# Patient Record
Sex: Male | Born: 1955 | Race: White | Hispanic: No | Marital: Single | Smoking: Former smoker
Health system: Southern US, Community
[De-identification: ages and names within clinical notes are randomized; demographics above are authoritative.]

## PROBLEM LIST (undated history)

## (undated) DIAGNOSIS — Z95810 Presence of automatic (implantable) cardiac defibrillator: Secondary | ICD-10-CM

## (undated) DIAGNOSIS — H9319 Tinnitus, unspecified ear: Secondary | ICD-10-CM

## (undated) DIAGNOSIS — K219 Gastro-esophageal reflux disease without esophagitis: Secondary | ICD-10-CM

## (undated) DIAGNOSIS — Z950 Presence of cardiac pacemaker: Secondary | ICD-10-CM

## (undated) DIAGNOSIS — I509 Heart failure, unspecified: Secondary | ICD-10-CM

## (undated) DIAGNOSIS — I4819 Other persistent atrial fibrillation: Secondary | ICD-10-CM

## (undated) DIAGNOSIS — D4959 Neoplasm of unspecified behavior of other genitourinary organ: Secondary | ICD-10-CM

## (undated) DIAGNOSIS — R51 Headache: Secondary | ICD-10-CM

## (undated) DIAGNOSIS — K635 Polyp of colon: Secondary | ICD-10-CM

## (undated) DIAGNOSIS — G4700 Insomnia, unspecified: Secondary | ICD-10-CM

## (undated) DIAGNOSIS — K5790 Diverticulosis of intestine, part unspecified, without perforation or abscess without bleeding: Secondary | ICD-10-CM

## (undated) DIAGNOSIS — I447 Left bundle-branch block, unspecified: Secondary | ICD-10-CM

## (undated) DIAGNOSIS — E785 Hyperlipidemia, unspecified: Secondary | ICD-10-CM

## (undated) DIAGNOSIS — I2510 Atherosclerotic heart disease of native coronary artery without angina pectoris: Secondary | ICD-10-CM

## (undated) HISTORY — PX: TONSILLECTOMY: SUR1361

## (undated) HISTORY — DX: Tinnitus, unspecified ear: H93.19

## (undated) HISTORY — DX: Polyp of colon: K63.5

## (undated) HISTORY — DX: Left bundle-branch block, unspecified: I44.7

## (undated) HISTORY — DX: Atherosclerotic heart disease of native coronary artery without angina pectoris: I25.10

## (undated) HISTORY — DX: Heart failure, unspecified: I50.9

## (undated) HISTORY — PX: OTHER SURGICAL HISTORY: SHX169

## (undated) HISTORY — DX: Presence of automatic (implantable) cardiac defibrillator: Z95.810

## (undated) HISTORY — DX: Other persistent atrial fibrillation: I48.19

## (undated) HISTORY — DX: Hyperlipidemia, unspecified: E78.5

## (undated) HISTORY — DX: Insomnia, unspecified: G47.00

## (undated) HISTORY — DX: Diverticulosis of intestine, part unspecified, without perforation or abscess without bleeding: K57.90

## (undated) HISTORY — DX: Neoplasm of unspecified behavior of other genitourinary organ: D49.59

---

## 2003-07-11 HISTORY — PX: CARDIAC CATHETERIZATION: SHX172

## 2003-07-23 ENCOUNTER — Inpatient Hospital Stay (HOSPITAL_COMMUNITY): Admission: EM | Admit: 2003-07-23 | Discharge: 2003-08-12 | Payer: Self-pay | Admitting: Emergency Medicine

## 2003-08-10 ENCOUNTER — Encounter: Payer: Self-pay | Admitting: Internal Medicine

## 2003-08-10 HISTORY — PX: OTHER SURGICAL HISTORY: SHX169

## 2003-08-31 ENCOUNTER — Encounter (HOSPITAL_COMMUNITY): Admission: RE | Admit: 2003-08-31 | Discharge: 2003-11-29 | Payer: Self-pay | Admitting: *Deleted

## 2004-05-11 ENCOUNTER — Ambulatory Visit: Payer: Self-pay | Admitting: Cardiology

## 2004-05-11 ENCOUNTER — Ambulatory Visit: Payer: Self-pay | Admitting: Internal Medicine

## 2004-05-26 ENCOUNTER — Ambulatory Visit: Payer: Self-pay | Admitting: Internal Medicine

## 2004-06-08 ENCOUNTER — Ambulatory Visit: Payer: Self-pay

## 2004-06-08 ENCOUNTER — Ambulatory Visit: Payer: Self-pay | Admitting: Cardiology

## 2004-06-13 ENCOUNTER — Ambulatory Visit: Admission: RE | Admit: 2004-06-13 | Discharge: 2004-06-13 | Payer: Self-pay | Admitting: Internal Medicine

## 2004-06-13 ENCOUNTER — Ambulatory Visit: Payer: Self-pay | Admitting: Internal Medicine

## 2004-06-14 ENCOUNTER — Ambulatory Visit: Payer: Self-pay | Admitting: Internal Medicine

## 2004-06-15 ENCOUNTER — Inpatient Hospital Stay (HOSPITAL_COMMUNITY): Admission: AD | Admit: 2004-06-15 | Discharge: 2004-06-16 | Payer: Self-pay | Admitting: Internal Medicine

## 2004-06-15 HISTORY — PX: OTHER SURGICAL HISTORY: SHX169

## 2004-06-28 ENCOUNTER — Ambulatory Visit: Payer: Self-pay | Admitting: Internal Medicine

## 2004-07-15 ENCOUNTER — Ambulatory Visit: Payer: Self-pay | Admitting: *Deleted

## 2004-07-29 ENCOUNTER — Ambulatory Visit: Payer: Self-pay | Admitting: Internal Medicine

## 2004-09-13 ENCOUNTER — Ambulatory Visit: Payer: Self-pay | Admitting: *Deleted

## 2004-10-12 ENCOUNTER — Ambulatory Visit: Payer: Self-pay | Admitting: Internal Medicine

## 2004-10-24 ENCOUNTER — Ambulatory Visit: Payer: Self-pay

## 2004-10-24 ENCOUNTER — Ambulatory Visit: Payer: Self-pay | Admitting: *Deleted

## 2004-10-26 ENCOUNTER — Ambulatory Visit: Payer: Self-pay | Admitting: Internal Medicine

## 2004-11-02 ENCOUNTER — Ambulatory Visit: Payer: Self-pay | Admitting: Internal Medicine

## 2004-11-10 ENCOUNTER — Ambulatory Visit: Payer: Self-pay | Admitting: Internal Medicine

## 2004-12-06 ENCOUNTER — Ambulatory Visit: Payer: Self-pay | Admitting: Cardiology

## 2004-12-14 ENCOUNTER — Ambulatory Visit: Payer: Self-pay | Admitting: Internal Medicine

## 2005-01-03 ENCOUNTER — Ambulatory Visit: Payer: Self-pay | Admitting: Internal Medicine

## 2005-02-02 ENCOUNTER — Ambulatory Visit: Payer: Self-pay | Admitting: Internal Medicine

## 2005-02-02 ENCOUNTER — Ambulatory Visit: Payer: Self-pay

## 2005-05-29 ENCOUNTER — Ambulatory Visit: Payer: Self-pay | Admitting: Internal Medicine

## 2005-10-17 ENCOUNTER — Ambulatory Visit: Payer: Self-pay | Admitting: Internal Medicine

## 2006-01-18 ENCOUNTER — Ambulatory Visit: Payer: Self-pay | Admitting: Internal Medicine

## 2006-02-05 ENCOUNTER — Ambulatory Visit: Payer: Self-pay | Admitting: Internal Medicine

## 2006-02-09 ENCOUNTER — Ambulatory Visit: Payer: Self-pay | Admitting: Internal Medicine

## 2006-02-14 ENCOUNTER — Encounter: Payer: Self-pay | Admitting: Internal Medicine

## 2006-02-14 ENCOUNTER — Ambulatory Visit: Payer: Self-pay | Admitting: Internal Medicine

## 2006-02-14 ENCOUNTER — Ambulatory Visit (HOSPITAL_COMMUNITY): Admission: RE | Admit: 2006-02-14 | Discharge: 2006-02-14 | Payer: Self-pay | Admitting: Internal Medicine

## 2006-02-14 ENCOUNTER — Ambulatory Visit: Payer: Self-pay

## 2006-03-01 ENCOUNTER — Ambulatory Visit: Payer: Self-pay | Admitting: Internal Medicine

## 2006-03-23 ENCOUNTER — Ambulatory Visit: Payer: Self-pay

## 2006-04-12 ENCOUNTER — Ambulatory Visit: Payer: Self-pay | Admitting: Internal Medicine

## 2006-07-19 ENCOUNTER — Ambulatory Visit: Payer: Self-pay | Admitting: Internal Medicine

## 2006-07-30 ENCOUNTER — Ambulatory Visit: Payer: Self-pay

## 2006-08-01 ENCOUNTER — Ambulatory Visit: Payer: Self-pay | Admitting: Internal Medicine

## 2006-08-01 LAB — CONVERTED CEMR LAB
ALT: 29 units/L (ref 0–40)
AST: 20 units/L (ref 0–37)
Albumin: 4.1 g/dL (ref 3.5–5.2)
Alkaline Phosphatase: 46 units/L (ref 39–117)
BUN: 19 mg/dL (ref 6–23)
CO2: 28 meq/L (ref 19–32)
Calcium: 9.3 mg/dL (ref 8.4–10.5)
Chloride: 103 meq/L (ref 96–112)
Cholesterol: 84 mg/dL (ref 0–200)
Creatinine, Ser: 1.1 mg/dL (ref 0.4–1.5)
GFR calc Af Amer: 91 mL/min
GFR calc non Af Amer: 75 mL/min
Glucose, Bld: 104 mg/dL — ABNORMAL HIGH (ref 70–99)
HDL: 32.6 mg/dL — ABNORMAL LOW (ref >39.0)
Hgb A1c MFr Bld: 6.7 % — ABNORMAL HIGH (ref 4.6–6.0)
LDL Cholesterol: 32 mg/dL (ref 0–99)
Potassium: 4.5 meq/L (ref 3.5–5.1)
Pro B Natriuretic peptide (BNP): 137 pg/mL — ABNORMAL HIGH (ref 0.0–100.0)
Sodium: 137 meq/L (ref 135–145)
Total Bilirubin: 1.1 mg/dL (ref 0.3–1.2)
Total CHOL/HDL Ratio: 2.6
Total Protein: 7.1 g/dL (ref 6.0–8.3)
Triglycerides: 97 mg/dL (ref 0–149)
VLDL: 19 mg/dL (ref 0–40)

## 2006-10-22 ENCOUNTER — Ambulatory Visit: Payer: Self-pay | Admitting: Internal Medicine

## 2006-10-22 LAB — CONVERTED CEMR LAB
ALT: 39 units/L (ref 0–40)
Alkaline Phosphatase: 46 units/L (ref 39–117)
BUN: 14 mg/dL (ref 6–23)
CO2: 27 meq/L (ref 19–32)
Calcium: 9.5 mg/dL (ref 8.4–10.5)
Chloride: 109 meq/L (ref 96–112)
Creatinine, Ser: 0.9 mg/dL (ref 0.4–1.5)
GFR calc Af Amer: 114 mL/min
GFR calc non Af Amer: 95 mL/min
Glucose, Bld: 110 mg/dL — ABNORMAL HIGH (ref 70–99)
Hgb A1c MFr Bld: 6.7 % — ABNORMAL HIGH (ref 4.6–6.0)
LDL Cholesterol: 36 mg/dL (ref 0–99)
PSA: 0.31 ng/mL (ref 0.10–4.00)
Potassium: 4.4 meq/L (ref 3.5–5.1)
Pro B Natriuretic peptide (BNP): 133 pg/mL — ABNORMAL HIGH (ref 0.0–100.0)
Total CHOL/HDL Ratio: 2.5
Total Protein: 7.1 g/dL (ref 6.0–8.3)
Triglycerides: 85 mg/dL (ref 0–149)
VLDL: 17 mg/dL (ref 0–40)

## 2006-10-25 ENCOUNTER — Ambulatory Visit: Payer: Self-pay | Admitting: Internal Medicine

## 2006-11-15 ENCOUNTER — Ambulatory Visit: Payer: Self-pay | Admitting: Internal Medicine

## 2006-12-12 ENCOUNTER — Encounter: Payer: Self-pay | Admitting: Internal Medicine

## 2006-12-12 ENCOUNTER — Ambulatory Visit: Payer: Self-pay | Admitting: Internal Medicine

## 2007-02-04 ENCOUNTER — Ambulatory Visit: Payer: Self-pay | Admitting: Internal Medicine

## 2007-03-21 ENCOUNTER — Ambulatory Visit: Payer: Self-pay | Admitting: Internal Medicine

## 2007-05-27 ENCOUNTER — Encounter: Payer: Self-pay | Admitting: Internal Medicine

## 2007-05-27 ENCOUNTER — Ambulatory Visit: Payer: Self-pay | Admitting: Cardiology

## 2007-05-27 ENCOUNTER — Ambulatory Visit: Payer: Self-pay | Admitting: Internal Medicine

## 2007-06-20 ENCOUNTER — Ambulatory Visit: Payer: Self-pay | Admitting: Internal Medicine

## 2007-08-07 ENCOUNTER — Ambulatory Visit: Payer: Self-pay | Admitting: Internal Medicine

## 2007-08-14 ENCOUNTER — Ambulatory Visit: Payer: Self-pay | Admitting: Internal Medicine

## 2007-08-23 ENCOUNTER — Encounter: Payer: Self-pay | Admitting: Internal Medicine

## 2007-08-23 ENCOUNTER — Ambulatory Visit: Payer: Self-pay

## 2007-11-07 ENCOUNTER — Ambulatory Visit: Payer: Self-pay | Admitting: Internal Medicine

## 2007-11-21 ENCOUNTER — Ambulatory Visit: Payer: Self-pay | Admitting: Internal Medicine

## 2008-05-07 ENCOUNTER — Ambulatory Visit: Payer: Self-pay | Admitting: Internal Medicine

## 2008-05-18 ENCOUNTER — Ambulatory Visit (HOSPITAL_COMMUNITY): Admission: RE | Admit: 2008-05-18 | Discharge: 2008-05-18 | Payer: Self-pay | Admitting: Internal Medicine

## 2008-05-18 ENCOUNTER — Ambulatory Visit: Payer: Self-pay | Admitting: Internal Medicine

## 2008-07-16 ENCOUNTER — Ambulatory Visit: Payer: Self-pay | Admitting: Internal Medicine

## 2008-07-16 LAB — CONVERTED CEMR LAB
ALT: 25 units/L (ref 0–53)
AST: 19 units/L (ref 0–37)
Albumin: 3.8 g/dL (ref 3.5–5.2)
Alkaline Phosphatase: 43 units/L (ref 39–117)
BUN: 17 mg/dL (ref 6–23)
Bilirubin, Direct: 0.1 mg/dL (ref 0.0–0.3)
CO2: 25 meq/L (ref 19–32)
Calcium: 9 mg/dL (ref 8.4–10.5)
Chloride: 104 meq/L (ref 96–112)
Cholesterol: 84 mg/dL (ref 0–200)
Creatinine, Ser: 1 mg/dL (ref 0.4–1.5)
GFR calc Af Amer: 101 mL/min
GFR calc non Af Amer: 83 mL/min
Glucose, Bld: 137 mg/dL — ABNORMAL HIGH (ref 70–99)
HDL: 33.2 mg/dL — ABNORMAL LOW (ref >39.0)
LDL Cholesterol: 31 mg/dL (ref 0–99)
Potassium: 4.2 meq/L (ref 3.5–5.1)
Pro B Natriuretic peptide (BNP): 126 pg/mL — ABNORMAL HIGH (ref 0.0–100.0)
Sodium: 135 meq/L (ref 135–145)
Total Bilirubin: 1 mg/dL (ref 0.3–1.2)
Total CHOL/HDL Ratio: 2.5
Total Protein: 7 g/dL (ref 6.0–8.3)
Triglycerides: 99 mg/dL (ref 0–149)
VLDL: 20 mg/dL (ref 0–40)

## 2008-08-14 ENCOUNTER — Ambulatory Visit: Payer: Self-pay | Admitting: Internal Medicine

## 2008-08-14 ENCOUNTER — Encounter (INDEPENDENT_AMBULATORY_CARE_PROVIDER_SITE_OTHER): Payer: Self-pay | Admitting: *Deleted

## 2008-11-12 ENCOUNTER — Ambulatory Visit: Payer: Self-pay | Admitting: Internal Medicine

## 2008-12-02 ENCOUNTER — Ambulatory Visit: Payer: Self-pay | Admitting: Internal Medicine

## 2008-12-02 DIAGNOSIS — I255 Ischemic cardiomyopathy: Secondary | ICD-10-CM

## 2008-12-02 DIAGNOSIS — E785 Hyperlipidemia, unspecified: Secondary | ICD-10-CM

## 2008-12-02 DIAGNOSIS — I5022 Chronic systolic (congestive) heart failure: Secondary | ICD-10-CM | POA: Insufficient documentation

## 2008-12-09 ENCOUNTER — Encounter (INDEPENDENT_AMBULATORY_CARE_PROVIDER_SITE_OTHER): Payer: Self-pay | Admitting: *Deleted

## 2009-02-11 ENCOUNTER — Ambulatory Visit: Payer: Self-pay | Admitting: Internal Medicine

## 2009-05-13 ENCOUNTER — Ambulatory Visit: Payer: Self-pay | Admitting: Internal Medicine

## 2009-05-17 ENCOUNTER — Encounter: Payer: Self-pay | Admitting: Internal Medicine

## 2009-06-21 ENCOUNTER — Ambulatory Visit: Payer: Self-pay | Admitting: Internal Medicine

## 2009-08-30 ENCOUNTER — Ambulatory Visit: Payer: Self-pay | Admitting: Internal Medicine

## 2009-09-01 ENCOUNTER — Encounter: Payer: Self-pay | Admitting: Internal Medicine

## 2009-10-08 DIAGNOSIS — Z95810 Presence of automatic (implantable) cardiac defibrillator: Secondary | ICD-10-CM

## 2009-10-08 HISTORY — DX: Presence of automatic (implantable) cardiac defibrillator: Z95.810

## 2009-11-01 ENCOUNTER — Encounter: Payer: Self-pay | Admitting: Internal Medicine

## 2009-11-03 ENCOUNTER — Emergency Department (HOSPITAL_COMMUNITY): Admission: EM | Admit: 2009-11-03 | Discharge: 2009-11-03 | Payer: Self-pay | Admitting: Emergency Medicine

## 2009-11-03 ENCOUNTER — Ambulatory Visit: Payer: Self-pay | Admitting: Internal Medicine

## 2009-11-10 ENCOUNTER — Telehealth: Payer: Self-pay | Admitting: Internal Medicine

## 2009-11-24 ENCOUNTER — Ambulatory Visit: Payer: Self-pay | Admitting: Internal Medicine

## 2009-11-30 ENCOUNTER — Encounter: Payer: Self-pay | Admitting: Internal Medicine

## 2009-12-01 ENCOUNTER — Encounter (INDEPENDENT_AMBULATORY_CARE_PROVIDER_SITE_OTHER): Payer: Self-pay | Admitting: *Deleted

## 2009-12-02 ENCOUNTER — Ambulatory Visit: Payer: Self-pay | Admitting: Internal Medicine

## 2009-12-28 ENCOUNTER — Encounter: Payer: Self-pay | Admitting: Internal Medicine

## 2009-12-28 ENCOUNTER — Ambulatory Visit: Payer: Self-pay | Admitting: Internal Medicine

## 2009-12-28 LAB — CONVERTED CEMR LAB
BUN: 19 mg/dL (ref 6–23)
CO2: 26 meq/L (ref 19–32)
Calcium: 10 mg/dL (ref 8.4–10.5)
Chloride: 105 meq/L (ref 96–112)
Creatinine, Ser: 0.9 mg/dL (ref 0.4–1.5)
GFR calc non Af Amer: 88.73 mL/min (ref >60)
Glucose, Bld: 105 mg/dL — ABNORMAL HIGH (ref 70–99)
Potassium: 4.4 meq/L (ref 3.5–5.1)
Pro B Natriuretic peptide (BNP): 312.7 pg/mL — ABNORMAL HIGH (ref 0.0–100.0)
Sodium: 139 meq/L (ref 135–145)

## 2010-01-20 ENCOUNTER — Encounter: Payer: Self-pay | Admitting: Internal Medicine

## 2010-01-21 ENCOUNTER — Ambulatory Visit: Payer: Self-pay | Admitting: Internal Medicine

## 2010-01-24 ENCOUNTER — Telehealth (INDEPENDENT_AMBULATORY_CARE_PROVIDER_SITE_OTHER): Payer: Self-pay | Admitting: *Deleted

## 2010-01-25 ENCOUNTER — Ambulatory Visit: Payer: Self-pay | Admitting: Cardiology

## 2010-01-25 ENCOUNTER — Encounter (HOSPITAL_COMMUNITY): Admission: RE | Admit: 2010-01-25 | Discharge: 2010-03-29 | Payer: Self-pay | Admitting: Internal Medicine

## 2010-01-25 ENCOUNTER — Ambulatory Visit: Payer: Self-pay | Admitting: Internal Medicine

## 2010-01-25 ENCOUNTER — Ambulatory Visit: Payer: Self-pay

## 2010-01-25 ENCOUNTER — Encounter: Payer: Self-pay | Admitting: Cardiology

## 2010-01-25 DIAGNOSIS — R0602 Shortness of breath: Secondary | ICD-10-CM

## 2010-01-25 LAB — CONVERTED CEMR LAB
BUN: 17 mg/dL (ref 6–23)
Basophils Absolute: 0 10*3/uL (ref 0.0–0.1)
Basophils Relative: 0.6 % (ref 0.0–3.0)
CO2: 26 meq/L (ref 19–32)
Calcium: 9.4 mg/dL (ref 8.4–10.5)
Chloride: 108 meq/L (ref 96–112)
Creatinine, Ser: 0.9 mg/dL (ref 0.4–1.5)
Eosinophils Absolute: 0.1 10*3/uL (ref 0.0–0.7)
Eosinophils Relative: 1.8 % (ref 0.0–5.0)
GFR calc non Af Amer: 88.71 mL/min (ref >60)
Glucose, Bld: 118 mg/dL — ABNORMAL HIGH (ref 70–99)
HCT: 45.2 % (ref 39.0–52.0)
Hemoglobin: 15 g/dL (ref 13.0–17.0)
INR: 1 (ref 0.8–1.0)
Lymphocytes Relative: 22.6 % (ref 12.0–46.0)
Lymphs Abs: 1.4 10*3/uL (ref 0.7–4.0)
MCHC: 33.2 g/dL (ref 30.0–36.0)
MCV: 86 fL (ref 78.0–100.0)
Monocytes Absolute: 0.5 10*3/uL (ref 0.1–1.0)
Monocytes Relative: 7.7 % (ref 3.0–12.0)
Neutro Abs: 4.1 10*3/uL (ref 1.4–7.7)
Neutrophils Relative %: 67.3 % (ref 43.0–77.0)
Platelets: 184 10*3/uL (ref 150.0–400.0)
Potassium: 4.4 meq/L (ref 3.5–5.1)
Pro B Natriuretic peptide (BNP): 264.7 pg/mL — ABNORMAL HIGH (ref 0.0–100.0)
Prothrombin Time: 10.2 s (ref 9.7–11.8)
RBC: 5.26 M/uL (ref 4.22–5.81)
RDW: 14.9 % — ABNORMAL HIGH (ref 11.5–14.6)
Sodium: 138 meq/L (ref 135–145)
WBC: 6 10*3/uL (ref 4.5–10.5)
aPTT: 25.7 s (ref 21.7–28.8)

## 2010-01-27 ENCOUNTER — Ambulatory Visit (HOSPITAL_COMMUNITY): Admission: RE | Admit: 2010-01-27 | Discharge: 2010-01-27 | Payer: Self-pay | Admitting: Internal Medicine

## 2010-01-27 ENCOUNTER — Ambulatory Visit: Payer: Self-pay | Admitting: Internal Medicine

## 2010-01-28 ENCOUNTER — Encounter: Payer: Self-pay | Admitting: Internal Medicine

## 2010-02-09 ENCOUNTER — Ambulatory Visit: Payer: Self-pay

## 2010-02-09 ENCOUNTER — Encounter: Payer: Self-pay | Admitting: Internal Medicine

## 2010-04-28 ENCOUNTER — Encounter: Payer: Self-pay | Admitting: Internal Medicine

## 2010-05-02 ENCOUNTER — Ambulatory Visit: Payer: Self-pay | Admitting: Internal Medicine

## 2010-05-30 ENCOUNTER — Ambulatory Visit: Payer: Self-pay | Admitting: Internal Medicine

## 2010-06-23 ENCOUNTER — Encounter: Payer: Self-pay | Admitting: Internal Medicine

## 2010-06-23 ENCOUNTER — Ambulatory Visit (HOSPITAL_COMMUNITY)
Admission: RE | Admit: 2010-06-23 | Discharge: 2010-06-23 | Payer: Self-pay | Source: Home / Self Care | Attending: Internal Medicine | Admitting: Internal Medicine

## 2010-08-04 ENCOUNTER — Ambulatory Visit
Admission: RE | Admit: 2010-08-04 | Discharge: 2010-08-04 | Payer: Self-pay | Source: Home / Self Care | Attending: Internal Medicine | Admitting: Internal Medicine

## 2010-08-04 ENCOUNTER — Encounter: Payer: Self-pay | Admitting: Internal Medicine

## 2010-08-07 LAB — CONVERTED CEMR LAB
ALT: 34 units/L (ref 0–53)
AST: 24 units/L (ref 0–37)
Albumin: 4.4 g/dL (ref 3.5–5.2)
Alkaline Phosphatase: 46 units/L (ref 39–117)
BUN: 20 mg/dL (ref 6–23)
Basophils Absolute: 0 10*3/uL (ref 0.0–0.1)
Basophils Relative: 0.5 % (ref 0.0–3.0)
Bilirubin, Direct: 0.1 mg/dL (ref 0.0–0.3)
CO2: 27 meq/L (ref 19–32)
Calcium: 9.5 mg/dL (ref 8.4–10.5)
Chloride: 106 meq/L (ref 96–112)
Cholesterol, target level: 200 mg/dL
Cholesterol: 99 mg/dL (ref 0–200)
Creatinine, Ser: 1 mg/dL (ref 0.4–1.5)
Eosinophils Absolute: 0.1 10*3/uL (ref 0.0–0.7)
Eosinophils Relative: 1.4 % (ref 0.0–5.0)
GFR calc non Af Amer: 82.95 mL/min (ref >60)
Glucose, Bld: 93 mg/dL (ref 70–99)
HCT: 45 % (ref 39.0–52.0)
HDL goal, serum: 40 mg/dL
HDL: 39.6 mg/dL (ref >39.00)
Hemoglobin: 14.9 g/dL (ref 13.0–17.0)
LDL Cholesterol: 36 mg/dL (ref 0–99)
LDL Goal: 100 mg/dL
Lymphocytes Relative: 26.5 % (ref 12.0–46.0)
Lymphs Abs: 1.9 10*3/uL (ref 0.7–4.0)
MCHC: 33.2 g/dL (ref 30.0–36.0)
MCV: 86.6 fL (ref 78.0–100.0)
Monocytes Absolute: 0.6 10*3/uL (ref 0.1–1.0)
Monocytes Relative: 8.5 % (ref 3.0–12.0)
Neutro Abs: 4.6 10*3/uL (ref 1.4–7.7)
Neutrophils Relative %: 63.1 % (ref 43.0–77.0)
Platelets: 196 10*3/uL (ref 150.0–400.0)
Potassium: 4.1 meq/L (ref 3.5–5.1)
Pro B Natriuretic peptide (BNP): 124 pg/mL — ABNORMAL HIGH (ref 0.0–100.0)
RBC: 5.19 M/uL (ref 4.22–5.81)
RDW: 13.5 % (ref 11.5–14.6)
Sodium: 137 meq/L (ref 135–145)
Total Bilirubin: 1.2 mg/dL (ref 0.3–1.2)
Total CHOL/HDL Ratio: 3
Total Protein: 7.8 g/dL (ref 6.0–8.3)
Triglycerides: 118 mg/dL (ref 0.0–149.0)
VLDL: 23.6 mg/dL (ref 0.0–40.0)
WBC: 7.2 10*3/uL (ref 4.5–10.5)

## 2010-08-09 NOTE — Progress Notes (Signed)
Summary: Nuclear Pre-Procedure  Phone Note Outgoing Call   Call placed by: Milana Na Action Taken: Phone Call Completed Summary of Call: Reviewed information on Myoview Information Sheet (see scanned document for further details).  Spoke with Patient.    Nuclear Med Background Indications for Stress Test: Evaluation for Ischemia  Indications Comments: Pending AICD  History: Defibrillator, Echo, Heart Catheterization, Myocardial Infarction, Myocardial Perfusion Study, Stents  History Comments: '05 MPS > Heart Cath 12/05 Defibrillator '09 Echo: EF=20-25%     Nuclear Pre-Procedure Cardiac Risk Factors: Family History - CAD, History of Smoking, Hypertension, LBBB, Lipids, NIDDM Height (in): 76

## 2010-08-09 NOTE — Cardiovascular Report (Signed)
Summary: ICD Follow Up Summary   ICD Follow Up Summary   Imported By: Roderic Ovens 12/28/2009 15:45:09  _____________________________________________________________________  External Attachment:    Type:   Image     Comment:   External Document

## 2010-08-09 NOTE — Assessment & Plan Note (Signed)
Summary: 1 month rov/sl   Visit Type:  Follow-up Primary Provider:  Martha Clan, MD   History of Present Illness: [name] is a 55 year old male with history of congestive heart failure secondary to ischemic cardiomyopathy with an EF of 20%.  He is status post multiple myocardial infarctions and previous stents.  He has a biventricular ICD in place.  Remainder of medical history includes diabetes, obesity, hypertension, and a low HDL. Previous CPX test, which showed no significant change. Peak VO2 is in the 20-22 range.  Earlier this year had 7 ICD shocks. Had stopped all his medications on 4/6 due to diarrhea. Was out cleaning gutters and came into house. ICD started fring. On interrogation of device had inappropriate firing due to sinus tach at 174. After shock degenerated into VF. No CP or dyspnea prior to device going off. Started back on Coreg but diarrhea started right back up again.  I switched him to bisprolol 5 once daily. No further diarrhea.  Now very nervous about ICD going off again.   Otherwise feeling very good. Diarrhea has essentially resolved.  Dypnea improved.  No orthopnea, PND or edema. No further ICD shocks. No palpitations. Back on most of his medications except statin.   Current Medications (verified): 1)  Bisoprolol Fumarate 5 Mg Tabs (Bisoprolol Fumarate) .... Take One Tablet By Mouth Daily 2)  Altace 5 Mg Caps (Ramipril) .... Take One Tablet By Mouth Daily. 3)  Glimepiride 4 Mg Tabs (Glimepiride) .... Once Daily 4)  Spironolactone 25 Mg Tabs (Spironolactone) .... Take One Tablet By Mouth Once Daily. 5)  Aspirin 81 Mg Tbec (Aspirin) 6)  Ambien Cr 12.5 Mg Cr-Tabs (Zolpidem Tartrate) .... As Needed 7)  Nitrostat 0.4 Mg Subl (Nitroglycerin) .Marland Kitchen.. 1 Tablet Under Tongue At Onset of Chest Pain; You May Repeat Every 5 Minutes For Up To 3 Doses.  Allergies (verified): No Known Drug Allergies  Past History:  Past Medical History: Last updated: 11/24/2009 1. Congestive  heart failure secondary to severe ischemic cardiomyopathy.     a.    EF 20% with akinesis of the distal half to two-thirds of the ventricle.  Trivial MR     b.     Status post bi-V ICD.     c.     Cardiopulmonary exercise test pVO2 of 22.1,VE/VCO2 slope is 26,  2. Coronary artery disease, status post multiple myocardial infarctions. 3. History of tobacco use, quit in 2005. 4. Hyperlipidemia. 5. History of left bundle branch block. 6. Diabetes 7. ICD shocks due to sinust tach 4/11  Review of Systems       As per HPI and past medical history; otherwise all systems negative.   Vital Signs:  Patient profile:   55 year old male Height:      76 inches Weight:      233 pounds BMI:     28.46 O2 Sat:      96 % Pulse rate:   55 / minute BP sitting:   116 / 78  (left arm)  Vitals Entered By: Laurance Flatten CMA (December 28, 2009 8:30 AM)  Physical Exam  General:  The patient was alert and oriented in no acute distress. HEENT Normal.  Neck veins were flat, carotids were brisk.  Lungs were clear.  Heart sounds were regular without murmurs or gallops. no s3/ Abdomen was soft with active bowel sounds. There is no clubbing cyanosis or edema. Skin Warm and dry Neuro. affect normal. cranial nerves intact.    ICD Specifications  Following MD:  Sherryl Manges, MD     Referring MD:  [facility] ICD Vendor:  [facility] Scientific     ICD Model Number:  H177     ICD Serial Number:  [id number] ICD DOI:  06/15/2004     ICD Implanting MD:  Sherryl Manges, MD  Lead 1:    Location: RA     DOI: 06/15/2004     Model #: [id number]     Serial #: [id number]     Status: active Lead 2:    Location: RV     DOI: 08/10/2003     Model #: [id number]     Serial #: [id number]     Status: active Lead 3:    Location: LV     DOI: 06/15/2004     Model #: [id number]     Serial #: [id number]     Status: active  Indications::  ICD;CHF  Explantation Comments: Latitude  ICD Follow Up ICD Dependent:  No      Brady Parameters Mode DDD     Lower Rate Limit:   45     Upper Rate Limit 135 PAV 150     Sensed AV Delay:  120  Tachy Zones VF:  240     VT:  210     VT1:  190     Impression & Recommendations:  Problem # 1:  SYSTOLIC HEART FAILURE, CHRONIC (ICD-428.22) Remains NYHA class II-III. Volume status looks OK. Will check BMET and BNP today. If potassium OK will increase ramipril to 5 two times a day. Bisoprolol dose limited by bradycardia.  Problem # 2:  HYPERLIPIDEMIA (ICD-272.4) Off Niaspan after AIM-HIGH data. Restart simva 40.   Problem # 3:  Diarrhea Seems to have resolved with discontinuation of carvedilol. However, if recurs will need colonoscopy to evlauate for colitis (ischemic vs other).   Other Orders: TLB-BMP (Basic Metabolic Panel-BMET) (80048-METABOL) TLB-BNP (B-Natriuretic Peptide) (83880-BNPR)  Patient Instructions: 1)  Labs today 2)  Restart Simvastatin 3)  Follow up with Dr Graciela Husbands in August 4)  Follow up with Dr Gala Romney in 4 months Prescriptions: NITROSTAT 0.4 MG SUBL (NITROGLYCERIN) 1 tablet under tongue at onset of chest pain; you may repeat every 5 minutes for up to 3 doses.  #25 x 6   Entered by:   Meredith Staggers, RN   Authorized by:   Dolores Patty, MD, [facility]   Signed by:   Meredith Staggers, RN on 12/28/2009   Method used:   Electronically to        [facility].* (retail)       [address]       [place], [place]  [address]       Ph: [phone]       Fax: [phone]   RxID:   [id number]

## 2010-08-09 NOTE — Assessment & Plan Note (Signed)
Summary: per check out/sf   Visit Type:  Follow-up Primary Provider:  Martha Clan, MD  CC:  no complaints.  History of Present Illness: Mr Muegge  is a 55 year old male with history of congestive heart failure secondary to ischemic cardiomyopathy with an EF of 20%.  He is status post multiple myocardial infarctions and previous stents.  He has a biventricular ICD in place. He under derwent device generator replacement in July.   Remainder of medical history includes diabetes, obesity, hypertension, and a low HDL. Last CPX test was at the end of 2009  which showed no significant change with peak VO2 is in the 20-22 range.  Recently had generator change for ICD and had Myoview beforehand which showed EF 17% with markedly dilated LV  EDV: 509 ml ESV: 422 ml. Extensive scar with trivial peri-infarct ischemia.   Continue to have problems with exertional dyspnea and fatigue. Can walk through [facility] without stopping but typically very fatigued afterward. No orthopnea, PND, edema. had episode of tachycardia and HTN in September. Device interrogated no recurrent VT.  Continues to have problems with chronic diarrhea. No bloody stools.    Lipid Management History:      Positive NCEP/ATP III risk factors include male age 54 years old or older, HDL cholesterol less than 40, and ASHD (either angina/prior MI/prior CABG).    Current Medications (verified): 1)  Bisoprolol Fumarate 5 Mg Tabs (Bisoprolol Fumarate) .... Take One Tablet By Mouth Daily 2)  Altace 5 Mg Caps (Ramipril) .... Take 1 Tablet By Mouth Two Times A Day 3)  Glimepiride 4 Mg Tabs (Glimepiride) .... Once Daily 4)  Spironolactone 25 Mg Tabs (Spironolactone) .... Take One Tablet By Mouth Once Daily. 5)  Aspirin 81 Mg Tbec (Aspirin) 6)  Ambien Cr 12.5 Mg Cr-Tabs (Zolpidem Tartrate) .... As Needed  Allergies (verified): No Known Drug Allergies  Past History:  Past Medical History: Last updated: 04/28/2010 1. Congestive heart  failure secondary to severe ischemic cardiomyopathy.     a.    EF 20% with akinesis of the distal half to two-thirds of the ventricle.  Trivial MR     b.     Status post bi-V ICD.     c.     Cardiopulmonary exercise test pVO2 of 22.1,VE/VCO2 slope is 26,  2. Coronary artery disease, status post multiple myocardial infarctions. 3. History of tobacco use, quit in 2005. 4. Hyperlipidemia. 5. History of left bundle branch block. 6. Diabetes 7. ICD shocks due to sinust tach 4/11  Defibrillator generator explantation-2011 with pocket revision and implantation of new device- [employer] COGNIS N119  Review of Systems       As per HPI and past medical history; otherwise all systems negative.   Vital Signs:  Patient profile:   55 year old male Height:      76 inches Weight:      235 pounds BMI:     28.71 Pulse rate:   59 / minute BP sitting:   112 / 66  (left arm) Cuff size:   regular  Vitals Entered By: Hardin Negus, RMA (May 30, 2010 11:38 AM)  Physical Exam  General:  The patient was alert and oriented in no acute distress. HEENT Normal.  Neck veins were flat, carotids were brisk.  Lungs were clear.  Heart sounds were regular without murmurs or gallops.  Abdomen was soft with active bowel sounds. There is no clubbing cyanosis or edema. Skin Warm and dry Neuro: affect normal  cranial  nerves intact.     ICD Specifications Following MD:  Sherryl Manges, MD     Referring MD:  [facility] ICD Vendor:  [facility] Scientific     ICD Model Number:  n119     ICD Serial Number:  [id number] ICD DOI:  01/27/2010     ICD Implanting MD:  Sherryl Manges, MD  Lead 1:    Location: RA     DOI: 06/15/2004     Model #: 5076     Serial #: [id number]     Status: active Lead 2:    Location: RV     DOI: 08/10/2003     Model #: 5366     Serial #: [id number]     Status: active Lead 3:    Location: LV     DOI: 06/15/2004     Model #: 4543     Serial #: [id number]     Status: active  Indications::   ICD;CHF  Explantation Comments: 01/27/10 [employer] [id number] explanted  ICD Follow Up ICD Dependent:  No      Brady Parameters Mode DDD     Lower Rate Limit:  45     Upper Rate Limit 130 PAV 120     Sensed AV Delay:  95  Tachy Zones VF:  240     VT:  210     VT1:  190     Impression & Recommendations:  Problem # 1:  SYSTOLIC HEART FAILURE, CHRONIC (ICD-428.22) NYHA class III symptoms with marked LV enlargement on Myoview. Will proceed with CPX to further evaluate. Have been unable to titrate his ramipril due to worsening diarrhea. Will try to increase bisoprolol to 7.5 qd. We did consider the possibility of ischemic/low-flow colittis but has not had bloody stool. If CPX shows decreasing exercise tolerance will need t consider R and LHC. Long talk about possible need for advanced therapies and whether or not he would consider pursuing.   Other Orders: EKG w/ Interpretation (93000) CPX Test at [facility] (CPX Test)  Lipid Assessment/Plan:      Based on NCEP/ATP III, the patient's risk factor category is "history of coronary disease, peripheral vascular disease, cerebrovascular disease, or aortic aneurysm".  The patient's lipid goals are as follows: Total cholesterol goal is 200; LDL cholesterol goal is 100; HDL cholesterol goal is 40; Triglyceride goal is 150.     Patient Instructions: 1)  Increase Bisoprolol to 1 & 1/2 tabs daily 2)  Your physician has recommended that you have a cardiopulmonary stress test (CPX).  CPX testing is a non-invasive measurement of heart and lung function. It replaces a traditional treadmill stress test. This type of test provides a tremendous amount of information that relates not only to your present condition but also for future outcomes.  This test combines measurements of your ventilation, respiratory gas exchange in the lungs, electrocardiogram (EKG), blood pressure and physical response before, during, and following an exercise  protocol. 3)  Follow up in 4 months. Prescriptions: BISOPROLOL FUMARATE 5 MG TABS (BISOPROLOL FUMARATE) Take 1 & 1/2 tablet by mouth daily  #45 x 6   Entered by:   Meredith Staggers, RN   Authorized by:   Dolores Patty, MD, [facility]   Signed by:   Meredith Staggers, RN on 05/30/2010   Method used:   Electronically to        [facility].* (retail)       [address]  [place], [place]  [address]       Ph: [phone]       Fax: [phone]   RxID:   [id number]

## 2010-08-09 NOTE — Cardiovascular Report (Signed)
Summary: Pre Op Orders   Pre Op Orders   Imported By: Roderic Ovens 01/26/2010 10:22:31  _____________________________________________________________________  External Attachment:    Type:   Image     Comment:   External Document

## 2010-08-09 NOTE — Letter (Signed)
Summary: Remote Device Check  [facility], Main Office  [address]   [place], [place] [address]   Phone: [phone]  Fax: [phone]     December 28, 2009 MRN: [id number]   [facility] [address] [place], [place]  [address]   Dear Mr. Detar North,   Your remote transmission was recieved and reviewed by your physician.  All diagnostics were within normal limits for you.  ___X___Your next office visit is scheduled for:    03-04-2010 @ 145PM WITH DR Graciela Husbands.    Sincerely,  Vella Kohler

## 2010-08-09 NOTE — Assessment & Plan Note (Signed)
Summary: device reached eri/gdt   Visit Type:  rov Primary Provider:  Martha Clan, MD  CC:  sob at times...denies any cp or edema.  History of Present Illness: [name]  is a 55 year old male with history of congestive heart failure secondary to ischemic cardiomyopathy with an EF of 20%.  He is status post multiple myocardial infarctions and previous stents.  He has a biventricular ICD in place. This has now reacehd  ERI   Remainder of medical history includes diabetes, obesity, hypertension, and a low HDL. Previous CPX test, which showed no significant change. Peak VO2 is in the 20-22 range.  His head no chest pain. It has been years since his last catheterization he does not recall Myoview scanning  Current Medications (verified): 1)  Bisoprolol Fumarate 5 Mg Tabs (Bisoprolol Fumarate) .... Take One Tablet By Mouth Daily 2)  Altace 5 Mg Caps (Ramipril) .... Take 1 Tablet By Mouth Two Times A Day 3)  Glimepiride 4 Mg Tabs (Glimepiride) .... Once Daily 4)  Spironolactone 25 Mg Tabs (Spironolactone) .... Take One Tablet By Mouth Once Daily. 5)  Aspirin 81 Mg Tbec (Aspirin) 6)  Ambien Cr 12.5 Mg Cr-Tabs (Zolpidem Tartrate) .... As Needed 7)  Nitrostat 0.4 Mg Subl (Nitroglycerin) .Marland Kitchen.. 1 Tablet Under Tongue At Onset of Chest Pain; You May Repeat Every 5 Minutes For Up To 3 Doses. 8)  Simvastatin 40 Mg Tabs (Simvastatin) .... Take One Tablet By Mouth Daily At Bedtime  Allergies (verified): No Known Drug Allergies  Past History:  Past Medical History: Last updated: 11/24/2009 1. Congestive heart failure secondary to severe ischemic cardiomyopathy.     a.    EF 20% with akinesis of the distal half to two-thirds of the ventricle.  Trivial Scott     b.     Status post bi-V ICD.     c.     Cardiopulmonary exercise test pVO2 of 22.1,VE/VCO2 slope is 26,  2. Coronary artery disease, status post multiple myocardial infarctions. 3. History of tobacco use, quit in 2005. 4.  Hyperlipidemia. 5. History of left bundle branch block. 6. Diabetes 7. ICD shocks due to sinust tach 4/11  Past Surgical History: Last updated: 11/25/2008  DATE OF PROCEDURE:  06/15/2004  PROCEDURE:  Contrast venography, upgrade of his single chamber defibrillator  to a dual chamber defibrillator with resynchronization therapy, pocket  revision, and defibrillator lead assessment.  PHYSICIAN:  Duke Salvia, M.D.   DATE OF PROCEDURE:  08/10/2003 PROCEDURE:  1. Single-chamber defibrillator implantation with intraoperative     defibrillation and threshold testing.  2. Contrast venography.  3. Fluoroscopy.  4. System revision.  PHYSICIAN:  Duke Salvia, M.D.  Family History: Last updated: 11/25/2008  There is some family history of coronary artery disease  Social History: Last updated: 11/25/2008 The patient lives in [place] and is single.  He smoked  two packs per day for 30 years and quit earlier this week.  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  Vital Signs:  Patient profile:   55 year old male Height:      76 inches Weight:      237 pounds BMI:     28.95 Pulse rate:   57 / minute Pulse rhythm:   regular BP sitting:   105 / 64  (left arm) Cuff size:   large  Vitals Entered By: Danielle Rankin, CMA (January 21, 2010 2:57 PM)  Physical Exam  General:  The  patient was alert and oriented in no acute distress. HEENT Normal.  Neck veins were flat, carotids were brisk.  Lungs were clear.  Heart sounds were regular without murmurs or gallops.  Abdomen was soft with active bowel sounds. There is no clubbing cyanosis or edema. Skin Warm and dry    EKG  Procedure date:  01/21/2010  Findings:      P. synchronous pacing with a upright QRS morphology in leads V1   ICD Specifications Following MD:  Sherryl Manges, MD     Referring MD:  [facility] ICD Vendor:  [facility] Scientific     ICD Model Number:  H177     ICD Serial Number:  [id number] ICD  DOI:  06/15/2004     ICD Implanting MD:  Sherryl Manges, MD  Lead 1:    Location: RA     DOI: 06/15/2004     Model #: 0350     Serial #: [id number]     Status: active Lead 2:    Location: RV     DOI: 08/10/2003     Model #: 3716     Serial #: [id number]     Status: active Lead 3:    Location: LV     DOI: 06/15/2004     Model #: 8101     Serial #: [id number]     Status: active  Indications::  ICD;CHF  Explantation Comments: Latitude  ICD Follow Up ICD Dependent:  No      Brady Parameters Mode DDD     Lower Rate Limit:  45     Upper Rate Limit 135 PAV 150     Sensed AV Delay:  120  Tachy Zones VF:  240     VT:  210     VT1:  190     Impression & Recommendations:  Problem # 1:  IMPLANTABLE   DEFIBRILLATOR CRT -BS (ICD-V45.02) His device has reached ERI. He'll need undergo generator replacement. We'll need to undertake Myoview scanning prior to that and has been years since his anatomy has been assessed. We discussed the benefits including and risks including but not limited to death and infection he understands and is willing to proceed  Problem # 2:  CAD, NATIVE VESSEL (ICD-414.01)  as above His updated medication list for this problem includes:    Bisoprolol Fumarate 5 Mg Tabs (Bisoprolol fumarate) .Marland Kitchen... Take one tablet by mouth daily    Altace 5 Mg Caps (Ramipril) .Marland Kitchen... Take 1 tablet by mouth two times a day    Aspirin 81 Mg Tbec (Aspirin)    Nitrostat 0.4 Mg Subl (Nitroglycerin) .Marland Kitchen... 1 tablet under tongue at onset of chest pain; you may repeat every 5 minutes for up to 3 doses.  Orders: Nuclear Stress Test (Nuc Stress Test)  Problem # 3:  SYSTOLIC HEART FAILURE, CHRONIC (ICD-428.22)  stable on current Medications  His updated medication list for this problem includes:    Bisoprolol Fumarate 5 Mg Tabs (Bisoprolol fumarate) .Marland Kitchen... Take one tablet by mouth daily    Altace 5 Mg Caps (Ramipril) .Marland Kitchen... Take 1 tablet by mouth two times a day    Spironolactone 25 Mg Tabs (Spironolactone) .Marland Kitchen...  Take one tablet by mouth once daily.    Aspirin 81 Mg Tbec (Aspirin)    Nitrostat 0.4 Mg Subl (Nitroglycerin) .Marland Kitchen... 1 tablet under tongue at onset of chest pain; you may repeat every 5 minutes for up to 3 doses.  Orders: Nuclear Stress Test (Nuc Stress Test)  Patient Instructions: 1)  Your physician has requested that you have an exercise stress myoview.  For further information please visit [email or web address]  Please follow instruction sheet, as given. 2)  See instruction sheet given to you today for ICD gen change.

## 2010-08-09 NOTE — Procedures (Signed)
Summary: wound check   Current Medications (verified): 1)  Bisoprolol Fumarate 5 Mg Tabs (Bisoprolol Fumarate) .... Take One Tablet By Mouth Daily 2)  Altace 5 Mg Caps (Ramipril) .... Take 1 Tablet By Mouth Two Times A Day 3)  Glimepiride 4 Mg Tabs (Glimepiride) .... Once Daily 4)  Spironolactone 25 Mg Tabs (Spironolactone) .... Take One Tablet By Mouth Once Daily. 5)  Aspirin 81 Mg Tbec (Aspirin) 6)  Ambien Cr 12.5 Mg Cr-Tabs (Zolpidem Tartrate) .... As Needed  Allergies (verified): No Known Drug Allergies   ICD Specifications Following MD:  Sherryl Manges, MD     Referring MD:  [facility] ICD Vendor:  [employer]     ICD Model Number:  n119     ICD Serial Number:  [id number] ICD DOI:  01/27/2010     ICD Implanting MD:  Sherryl Manges, MD  Lead 1:    Location: RA     DOI: 06/15/2004     Model #: 0454     Serial #: [id number]     Status: active Lead 2:    Location: RV     DOI: 08/10/2003     Model #: 8295     Serial #: [id number]     Status: active Lead 3:    Location: LV     DOI: 06/15/2004     Model #: 4543     Serial #: [id number]     Status: active  Indications::  ICD;CHF  Explantation Comments: 01/27/10 [employer] [id number] explanted  ICD Follow Up Battery Voltage:  GOOD V     Charge Time:  8.2 seconds     Battery Est. Longevity:  6.5 YRS Underlying rhythm:  SR ICD Dependent:  No       ICD Device Measurements Atrium:  Amplitude: 5.2 mV, Impedance: 534 ohms, Threshold: 0.6 V at 0.4 msec Right Ventricle:  Amplitude: 25.0 mV, Impedance: 539 ohms, Threshold: 0.8 V at 0.4 msec Left Ventricle:  Amplitude: 25.0 mV, Impedance: 756 ohms, Threshold: 0.9 V at 0.4 msec Shock Impedance: 68 ohms   Episodes MS Episodes:  1     Percent Mode Switch:  <1%     Shock:  0     ATP:  0     Nonsustained:  0     Atrial Therapies:  0 Atrial Pacing:  1%     Ventricular Pacing:  86%  Brady Parameters Mode DDD     Lower Rate Limit:  45     Upper Rate Limit 130 PAV 120     Sensed AV Delay:   95  Tachy Zones VF:  240     VT:  210     VT1:  190     Next Cardiology Appt Due:  05/02/2010 Tech Comments:  WOUND CHECK--STERI STRIPS REMOVED.  NO REDNESS OR SWELLING AT SITE.  NORMAL DEVICE FUNCTION.  NO EPISODES SINCE CHANGE OUT.  PT BROUGHT OLD LATITUDE BOX.  CALLED [facility] TO GET LATITUDE TRANSMITTER FOR NEW DEVICE--SPOKE Urbano Heir AND PT SHOULD HAVE NEW TRANSMITTER WITHIN A COUPLE OF WEEKS.  ROV ON 05-02-10 @ 1315 W/SK.  Vella Kohler  February 10, 2010 8:12 AM

## 2010-08-09 NOTE — Miscellaneous (Signed)
Summary: Device change out  Clinical Lists Changes  Observations: Added new observation of ICD IMPL DTE: 01/27/2010 (01/28/2010 14:07) Added new observation of ICD SERL#: [id number]  (01/28/2010 14:07) Added new observation of ICD MODL#: n119  (01/28/2010 14:07) Added new observation of [employer]: 01/27/10 [employer] [id number] explanted  (01/28/2010 14:07)       ICD Specifications Following MD:  Sherryl Manges, MD     Referring MD:  Gala Romney ICD Vendor:  [facility] Scientific     ICD Model Number:  n119     ICD Serial Number:  [id number] ICD DOI:  01/27/2010     ICD Implanting MD:  Sherryl Manges, MD  Lead 1:    Location: RA     DOI: 06/15/2004     Model #: 8119     Serial #: [id number]     Status: active Lead 2:    Location: RV     DOI: 08/10/2003     Model #: 1308     Serial #: [id number]     Status: active Lead 3:    Location: LV     DOI: 06/15/2004     Model #: 4543     Serial #: [id number]     Status: active  Indications::  ICD;CHF  Explantation Comments: 01/27/10 [employer] [id number] explanted  ICD Follow Up ICD Dependent:  No      Brady Parameters Mode DDD     Lower Rate Limit:  45     Upper Rate Limit 135 PAV 150     Sensed AV Delay:  120  Tachy Zones VF:  240     VT:  210     VT1:  190

## 2010-08-09 NOTE — Letter (Signed)
Summary: Implantable Device Instructions  Architectural technologist, Main Office  [address]   [place], [place] [address]   Phone: [phone]  Fax: [phone]      Implantable Device Instructions  You are scheduled for:  _____ Permanent Transvenous Pacemaker _____ Implantable Cardioverter Defibrillator _____ Implantable Loop Recorder __X___ Generator Change  on THURSDAY, January 27, 2010 with Dr. Graciela Husbands.  1.  Please arrive at the Short Stay Center at [facility] at 5:30 AM on the day of your procedure.  2.  Do not eat or drink after midnight the night before your procedure.  3.  Complete lab work on January 25, 2010.  The lab at [facility] is open from 8:30 AM to 1:30 PM and from 2:30 PM to 5:00 PM.  The lab at [facility] is open from 7:30 AM to 5:30 PM.  You do not have to be fasting.  4.  Do NOT take your [place] morning of procedure.  Ok to take other meds with water.    5.  Plan for an overnight stay.  Bring your insurance cards and a list of your medications.  6.  Wash your chest and neck with antibacterial soap (any brand) the evening before and the morning of your procedure.  Rinse well.   *If you have ANY questions after you get home, please call the office [phone].  *Every attempt is made to prevent procedures from being rescheduled.  Due to the nauture of Electrophysiology, rescheduling can happen.  The physician is always aware and directs the staff when this occurs.

## 2010-08-09 NOTE — Assessment & Plan Note (Signed)
Summary: pc2/jml   pt rescheduled   Visit Type:  Pacemaker check Primary Provider:  Martha Clan, MD  CC:  no complaints.  History of Present Illness: Mr Pellot  is a 55 year old male with history of congestive heart failure secondary to ischemic cardiomyopathy with an EF of 20%.  He is status post multiple myocardial infarctions and previous stents.  He has a biventricular ICD in place. He under derwent device generator replacement in July.   Remainder of medical history includes diabetes, obesity, hypertension, and a low HDL. Previous CPX test, which showed no significant change. Peak VO2 is in the 20-22 range.  His head no chest pain. It has been years since his last catheterization he does not recall Myoview scanning  Problems Prior to Update: 1)  Shortness of Breath  (ICD-786.05) 2)  Implantable Defibrillator Crt -bs  (ICD-V45.02) 3)  Encounter For Long-term Use of Other Medications  (ICD-V58.69) 4)  Hyperlipidemia  (ICD-272.4) 5)  Cad, Native Vessel  (ICD-414.01) 6)  Systolic Heart Failure, Chronic  (ICD-428.22)  Current Medications (verified): 1)  Bisoprolol Fumarate 5 Mg Tabs (Bisoprolol Fumarate) .... Take One Tablet By Mouth Daily 2)  Altace 5 Mg Caps (Ramipril) .... Take 1 Tablet By Mouth Two Times A Day 3)  Glimepiride 4 Mg Tabs (Glimepiride) .... Once Daily 4)  Spironolactone 25 Mg Tabs (Spironolactone) .... Take One Tablet By Mouth Once Daily. 5)  Aspirin 81 Mg Tbec (Aspirin) 6)  Ambien Cr 12.5 Mg Cr-Tabs (Zolpidem Tartrate) .... As Needed  Allergies (verified): No Known Drug Allergies  Past History:  Past Medical History: Last updated: 04/28/2010 1. Congestive heart failure secondary to severe ischemic cardiomyopathy.     a.    EF 20% with akinesis of the distal half to two-thirds of the ventricle.  Trivial MR     b.     Status post bi-V ICD.     c.     Cardiopulmonary exercise test pVO2 of 22.1,VE/VCO2 slope is 26,  2. Coronary artery disease, status post  multiple myocardial infarctions. 3. History of tobacco use, quit in 2005. 4. Hyperlipidemia. 5. History of left bundle branch block. 6. Diabetes 7. ICD shocks due to sinust tach 4/11  Defibrillator generator explantation-2011 with pocket revision and implantation of new device- [employer] [phone]  Past Surgical History: Last updated: 04/28/2010  DATE OF PROCEDURE:  06/15/2004  PROCEDURE:  Contrast venography, upgrade of his single chamber defibrillator  to a dual chamber defibrillator with resynchronization therapy, pocket  revision, and defibrillator lead assessment.  PHYSICIAN:  Duke Salvia, M.D.   DATE OF PROCEDURE:  08/10/2003 PROCEDURE:  1. Single-chamber defibrillator implantation with intraoperative     defibrillation and threshold testing.  2. Contrast venography.  3. Fluoroscopy.  4. System revision.  PHYSICIAN:  Duke Salvia, M.D.  Defibrillator generator explantation-2011 with pocket revision with implantation of a new device [employer] COGNIS N119  Family History: Last updated: 11/25/2008  There is some family history of coronary artery disease  Social History: Last updated: 11/25/2008 The patient lives in [place] and is single.  He smoked  two packs per day for 30 years and quit earlier this week.  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  Vital Signs:  Patient profile:   55 year old male Height:      76 inches Weight:      234.75 pounds BMI:     28.68 Pulse rate:   67 /  minute BP sitting:   127 / 68  (left arm) Cuff size:   regular  Vitals Entered By: Caralee Ates CMA (May 02, 2010 1:29 PM)  Physical Exam  General:  The patient was alert and oriented in no acute distress. HEENT Normal.  Neck veins were flat, carotids were brisk.  Lungs were clear.  Heart sounds were regular without murmurs or gallops.  Abdomen was soft with active bowel sounds. There is no clubbing cyanosis or edema. Skin  Warm and dry     ICD Specifications Following MD:  Sherryl Manges, MD     Referring MD:  [facility] ICD Vendor:  [facility] [employer]     ICD Model Number:  n119     ICD Serial Number:  [id number] ICD DOI:  01/27/2010     ICD Implanting MD:  Sherryl Manges, MD  Lead 1:    Location: RA     DOI: 06/15/2004     Model #: [id number]     Serial #: [id number]     Status: active Lead 2:    Location: RV     DOI: 08/10/2003     Model #: [id number]     Serial #: [id number]     Status: active Lead 3:    Location: LV     DOI: 06/15/2004     Model #: [id number]     Serial #: [id number]     Status: active  Indications::  ICD;CHF  Explantation Comments: 01/27/10 [employer] [id number] explanted  ICD Follow Up Battery Voltage:  GOOD V     Charge Time:  8.4 seconds     Battery Est. Longevity:  6.5 YRS Underlying rhythm:  SR ICD Dependent:  No       ICD Device Measurements Atrium:  Amplitude: 5.4 mV, Impedance: 505 ohms, Threshold: 0.6 V at 0.4 msec Right Ventricle:  Amplitude: 25.0 mV, Impedance: 494 ohms, Threshold: 0.8 V at 0.4 msec Left Ventricle:  Impedance: 777 ohms, Threshold: 1.0 V at 0.4 msec Shock Impedance: 63 ohms   Episodes MS Episodes:  0     Shock:  0     ATP:  0     Nonsustained:  0     Atrial Therapies:  0 Atrial Pacing:  <1%     Ventricular Pacing:  87%  Brady Parameters Mode DDD     Lower Rate Limit:  45     Upper Rate Limit 130 PAV 120     Sensed AV Delay:  95  Tachy Zones VF:  240     VT:  210     VT1:  190     Next Remote Date:  08/04/2010     Next Cardiology Appt Due:  04/10/2011 Tech Comments:  NORMAL DEVICE FUNCTION.  NO EPISODES SINCE LAST CHECK.  NO CHANGES MADE. LATITUDE CHECK 08-04-10. ROV IN 12 MTHS W/SK. Vella Kohler  Impression & Recommendations:  Problem # 1:  IMPLANTABLE   DEFIBRILLATOR CRT -BS (ICD-V45.02) Device parameters and data were reviewed and AV interval was reprogrammed  Problem # 2:  SYSTOLIC HEART FAILURE, CHRONIC (ICD-428.22) .stable  His updated medication list for  this problem includes:    Bisoprolol Fumarate 5 Mg Tabs (Bisoprolol fumarate) .Marland Kitchen... Take one tablet by mouth daily    Altace 5 Mg Caps (Ramipril) .Marland Kitchen... Take 1 tablet by mouth two times a day    Spironolactone 25 Mg Tabs (Spironolactone) .Marland Kitchen... Take one tablet by mouth once daily.    Aspirin 81 Mg Tbec (Aspirin)  Problem # 3:  CAD, NATIVE VESSEL (ICD-414.01) stabel  His updated medication list for this problem includes:    Bisoprolol Fumarate 5 Mg Tabs (Bisoprolol fumarate) .Marland Kitchen... Take one tablet by mouth daily    Altace 5 Mg Caps (Ramipril) .Marland Kitchen... Take 1 tablet by mouth two times a day    Aspirin 81 Mg Tbec (Aspirin)

## 2010-08-09 NOTE — Assessment & Plan Note (Signed)
Summary: f15m/appt time 12:15/lg   Visit Type:  Follow-up Primary Provider:  Martha Clan, MD  CC:  no complaints.  History of Present Illness: [name] is a 55 year old male with history of congestive heart failure secondary to ischemic cardiomyopathy with an EF of 20%.  He is status post multiple myocardial infarctions and previous stents.  He has a biventricular ICD in place.  Remainder of medical history includes diabetes, obesity, hypertension, and a low HDL. Previous CPX test, which showed no significant change. Peak VO2 is in the 20-22 range.  Two weeks ago was seen in ER with 7 ICD shocks. Had stopped all his medications on 4/6 due to diarrhea. Was out cleaning gutters and came into house. ICD started fring. On interrogationof device had inappropriate firing due to sinus tach at 174. After shock degenerated into VF. No CP or dyspnea prior to device going off. Started back on Coreg but diarrhea started right back up again.  I switched him to bisprolol 5 once daily. No further diarrhea.  Now very nervous about ICD going off again.   Otherwise feeling very good. Diarrhea has now resolved.  Dypnea improved.  Does have occasional upper back pain which occasionally responds to NTG. No orthopnea, PND or edema. No further ICD shocks. No palpitations.   Allergies (verified): No Known Drug Allergies  Past History:  Past Medical History: 1. Congestive heart failure secondary to severe ischemic cardiomyopathy.     a.    EF 20% with akinesis of the distal half to two-thirds of the ventricle.  Trivial MR     b.     Status post bi-V ICD.     c.     Cardiopulmonary exercise test pVO2 of 22.1,VE/VCO2 slope is 26,  2. Coronary artery disease, status post multiple myocardial infarctions. 3. History of tobacco use, quit in 2005. 4. Hyperlipidemia. 5. History of left bundle branch block. 6. Diabetes 7. ICD shocks due to sinust tach 4/11  Review of Systems       As per HPI and past medical  history; otherwise all systems negative.   Vital Signs:  Patient profile:   55 year old male Height:      76 inches Weight:      235 pounds BMI:     28.71 Pulse rate:   60 / minute BP sitting:   136 / 74  (left arm) Cuff size:   regular  Vitals Entered By: Hardin Negus, RMA (Nov 24, 2009 12:10 PM) CC: no complaints Comments he is only taking Bisoprolol daily and Ambien as needed    Physical Exam  General:  The patient was alert and oriented in no acute distress. HEENT Normal.  Neck veins were flat, carotids were brisk.  Lungs were clear.  Heart sounds were regular without murmurs or gallops. no s3/ Abdomen was soft with active bowel sounds. There is no clubbing cyanosis or edema. Skin Warm and dry Neuro. affect normal. cranial nerves intact.    ICD Specifications Following MD:  Sherryl Manges, MD     Referring MD:  [facility] ICD Vendor:  [facility] [employer]     ICD Model Number:  H177     ICD Serial Number:  [id number] ICD DOI:  06/15/2004     ICD Implanting MD:  Sherryl Manges, MD  Lead 1:    Location: RA     DOI: 06/15/2004     Model #: [id number]     Serial #: [id number]     Status: active Lead  2:    Location: RV     DOI: 08/10/2003     Model #: [id number]     Serial #: [id number]     Status: active Lead 3:    Location: LV     DOI: 06/15/2004     Model #: 3664     Serial #: [id number]     Status: active  Indications::  ICD;CHF  Explantation Comments: Latitude  ICD Follow Up ICD Dependent:  No      Brady Parameters Mode DDD     Lower Rate Limit:  45     Upper Rate Limit 135 PAV 150     Sensed AV Delay:  120  Tachy Zones VF:  240     VT:  200     VT1:  160     Impression & Recommendations:  Problem # 1:  SYSTOLIC HEART FAILURE, CHRONIC (ICD-428.22) Remains NYHA class II-III. Volume status looks OK. Will restart ramipril. See back in 1 month to continue dose titration. Long talk about ICD firings. Have given him a magnet in case of inappropriate firings. Told him NEVER to tape magnet so  that it will fall off in case of a malignant arrhytmia. Have increased VT1 zone to HR = 190.   Problem # 2:  CAD, NATIVE VESSEL (ICD-414.01) Suggested Myvoiew to evaluate for ischemia but cannot afford so he wishes to defer for now.   Patient Instructions: 1)  Restart Ramipril 5mg  daily 2)  Follow up in 1 month

## 2010-08-09 NOTE — Progress Notes (Signed)
Summary: Pt calling regarding medication  Phone Note Call from Patient Call back at [phone]   Caller: Patient Summary of Call: Pt calling regarding medication Initial call taken by: Judie Grieve,  Nov 10, 2009 3:06 PM  Follow-up for Phone Call        pt started back on coreg 12.5mg  two times a day on 4/27 when d/c'd from hospital, pt called today to report his diarrhea has returned, he is going about 3 times a day and he is frustrated by this, will discuss w/Dr Lace Chenevert and give pt a call back Meredith Staggers, RN  Nov 10, 2009 5:14 PM   Additional Follow-up for Phone Call Additional follow up Details #1::        would check c.diff. if negative can change coreg to bisoprolol 5 once daily. Dolores Patty, MD, [facility]  Nov 10, 2009 11:20 PM  Left message to call back Meredith Staggers, RN  Nov 11, 2009 2:49 PM   returning call, Migdalia Dk  Nov 11, 2009 2:53 PM     Additional Follow-up for Phone Call Additional follow up Details #2::    pt is aware, he believes diahrrea is from carvedilol and doesn't want to take it anymore, called in new rx for bisoprolol 5mg  daily, if diahrrea doesn't improve he will let me know and we will proceed w/c-diff testing Meredith Staggers, RN  Nov 11, 2009 5:10 PM   New/Updated Medications: BISOPROLOL FUMARATE 5 MG TABS (BISOPROLOL FUMARATE) Take one tablet by mouth daily Prescriptions: BISOPROLOL FUMARATE 5 MG TABS (BISOPROLOL FUMARATE) Take one tablet by mouth daily  #30 x 6   Entered by:   Meredith Staggers, RN   Authorized by:   Dolores Patty, MD, [facility]   Signed by:   Meredith Staggers, RN on 11/11/2009   Method used:   Electronically to        [facility] (retail)       [address]       [place], [place]  [address]       Ph: [phone]       Fax: [phone]   RxID:   [id number]

## 2010-08-09 NOTE — Miscellaneous (Signed)
  Clinical Lists Changes  Observations: Added new observation of NUCLEAR NOS: QPS  Raw Data Images:  There is no interference from other nuclear activity. Stress Images:  There is decreased uptake in the anterior wall, apex and lateral wall. Rest Images:  There is decreased uptake in the anterior wall, apex and lateral wall, slightly less promient compared to the stress images. Subtraction (SDS):  These findings are consistent with prior infarct and very mild peri-infarct ischemia. Transient Ischemic Dilatation:  .99  (Normal <1.22)  Lung/Heart Ratio:  .48  (Normal <0.45)  Quantitative Gated Spect Images  QGS EDV:  509 ml QGS ESV:  422 ml QGS EF:  17 % QGS cine images:  Global hypokinesis and apical akinesis; severe LVE.   Overall Impression   Exercise Capacity: Adenosine study with no exercise. BP Response: Normal blood pressure response. Clinical Symptoms: No chest pain ECG Impression: Not interpretable due to ventricular pacing. Overall Impression: Abnormal adenosine nuclear study with prior anterior, lateral and apical infarct; very mild peri-infarct ischemia.    Signed by Ferman Hamming, MD, [facility] on 01/25/2010 at 3:48 PM (01/25/2010 11:06)      Nuclear Study  Procedure date:  01/25/2010  Findings:      QPS  Raw Data Images:  There is no interference from other nuclear activity. Stress Images:  There is decreased uptake in the anterior wall, apex and lateral wall. Rest Images:  There is decreased uptake in the anterior wall, apex and lateral wall, slightly less promient compared to the stress images. Subtraction (SDS):  These findings are consistent with prior infarct and very mild peri-infarct ischemia. Transient Ischemic Dilatation:  .99  (Normal <1.22)  Lung/Heart Ratio:  .48  (Normal <0.45)  Quantitative Gated Spect Images  QGS EDV:  509 ml QGS ESV:  422 ml QGS EF:  17 % QGS cine images:  Global hypokinesis and apical akinesis; severe  LVE.   Overall Impression   Exercise Capacity: Adenosine study with no exercise. BP Response: Normal blood pressure response. Clinical Symptoms: No chest pain ECG Impression: Not interpretable due to ventricular pacing. Overall Impression: Abnormal adenosine nuclear study with prior anterior, lateral and apical infarct; very mild peri-infarct ischemia.    Signed by Ferman Hamming, MD, [facility] on 01/25/2010 at 3:48 PM

## 2010-08-09 NOTE — Procedures (Signed)
Summary: Cardiology Device Clinic   Allergies: No Known Drug Allergies   ICD Specifications Following MD:  Sherryl Manges, MD     Referring MD:  [facility] ICD Vendor:  [facility] [employer]     ICD Model Number:  H177     ICD Serial Number:  [id number] ICD DOI:  06/15/2004     ICD Implanting MD:  Sherryl Manges, MD  Lead 1:    Location: RA     DOI: 06/15/2004     Model #: 0454     Serial #: [id number]     Status: active Lead 2:    Location: RV     DOI: 08/10/2003     Model #: 8295     Serial #: [id number]     Status: active Lead 3:    Location: LV     DOI: 06/15/2004     Model #: 6578     Serial #: [id number]     Status: active  Indications::  ICD;CHF  Explantation Comments: Latitude  ICD Follow Up Remote Check?  No Battery Voltage:  2.56 V     Charge Time:  10.8 seconds     Battery Est. Longevity:  3 months Underlying rhythm:  SR ICD Dependent:  No       ICD Device Measurements Atrium:  Amplitude: 2.3 mV, Impedance: 459 ohms, Threshold: 0.4 V at 0.4 msec Right Ventricle:  Amplitude: 25 mV, Impedance: 589 ohms, Threshold: 0.8 V at 0.4 msec Left Ventricle:  Amplitude: 24.6 mV, Impedance: 678 ohms, Threshold: 0.8 V at 0.4 msec  Episodes MS Episodes:  0     Shock:  0     ATP:  0     Nonsustained:  0      Brady Parameters Mode DDD     Lower Rate Limit:  45     Upper Rate Limit 135 PAV 150     Sensed AV Delay:  120  Tachy Zones VF:  240     VT:  200     VT1:  160     Tech Comments:  Checked by industry.  No parameter changes.Altha Harm, LPN  September 01, 2009 9:11 AM

## 2010-08-09 NOTE — Letter (Signed)
Summary: Appointment - Reschedule  [facility] Cardiology     [place], [place]    Phone:   Fax:      Dec 01, 2009 MRN: [id number]   [facility] [address] [place], [place]  [address]   Dear Mr. Batch,   Due to a change in our office schedule, your appointment on 01-05-2010 at 11:30                must be changed.  It is very important that we reach you to reschedule this appointment. We look forward to participating in your health care needs. Please contact us at the number listed above at your earliest convenience to reschedule this appointment.     Sincerely,     Lorne Skeens  [facility] Scheduling Team

## 2010-08-09 NOTE — Procedures (Signed)
Summary: Cardiology Device Clinic   Allergies: No Known Drug Allergies   ICD Specifications Following MD:  Sherryl Manges, MD     Referring MD:  [facility] ICD Vendor:  [facility] [employer]     ICD Model Number:  H177     ICD Serial Number:  [id number] ICD DOI:  06/15/2004     ICD Implanting MD:  Sherryl Manges, MD  Lead 1:    Location: RA     DOI: 06/15/2004     Model #: 0454     Serial #: [id number]     Status: active Lead 2:    Location: RV     DOI: 08/10/2003     Model #: 8295     Serial #: [id number]     Status: active Lead 3:    Location: LV     DOI: 06/15/2004     Model #: 6578     Serial #: [id number]     Status: active  Indications::  ICD;CHF  Explantation Comments: Latitude  ICD Follow Up Battery Voltage:  2.54 V     Charge Time:  11.0 seconds     Underlying rhythm:  SR ICD Dependent:  No       ICD Device Measurements Atrium:  Impedance: 441 ohms,  Right Ventricle:  Impedance: 535 ohms,  Left Ventricle:  Impedance: 678 ohms,  Shock Impedance: 45 ohms   Brady Parameters Mode DDD     Lower Rate Limit:  45     Upper Rate Limit 135 PAV 150     Sensed AV Delay:  120  Tachy Zones VF:  240     VT:  210     VT1:  190     Tech Comments:  Pt seeing Dr Gala Romney for followup.  Per Dr Gala Romney changed VT-1 zone from 170 to 190bpm. Dr Graciela Husbands aware of change.  Pt has 1 episode of VT treated w/ATP rate of 169.  Further plans per Dr Graciela Husbands. Vella Kohler  Nov 30, 2009 10:27 AM

## 2010-08-09 NOTE — Assessment & Plan Note (Signed)
Summary: Cardiology Nuclear Testing  Nuclear Med Background Indications for Stress Test: Evaluation for Ischemia, Stent Patency  Indications Comments: Pending ICD generator change on 01/27/10 by Dr. Graciela Husbands  History: Defibrillator, Echo, Heart Catheterization, Myocardial Infarction, Myocardial Perfusion Study, Stents  History Comments: 1/05 Stent-RCA, 1/05 Cath:patent stent, PTCA-LAD unsuccessful, EF=13% ; 12/05 AICD; '09 Echo:EF=20-25%; remote h/o cocaine abuse 1980's.  Symptoms: DOE  Symptoms Comments: c/o exertional upper back pain. Last episode:one week ago.   Nuclear Pre-Procedure Cardiac Risk Factors: Family History - CAD, History of Smoking, Hypertension, LBBB, Lipids, NIDDM Caffeine/Decaff Intake: None NPO After: 8:30 PM Lungs: Clear.  O2 Sat 97% on RA. IV 0.9% NS with Angio Cath: 18g     IV Site: (R) AC IV Started by: Stanton Kidney EMT-P Chest Size (in) 48     Height (in): 76 Weight (lb): 232 BMI: 28.34 Tech Comments: CBG= 115 @ 6:56 this am, per patient.  Nuclear Med Study 1 or 2 day study:  1 day     Stress Test Type:  Adenosine Reading MD:  Olga Millers, MD     Referring MD:  Berton Mount, MD Resting Radionuclide:  Technetium 85m Tetrofosmin     Resting Radionuclide Dose:  10.0 mCi  Stress Radionuclide:  Technetium 66m Tetrofosmin     Stress Radionuclide Dose:  33.0 mCi   Stress Protocol  Dose of Adenosine:  59.1 mg    Stress Test Technologist:  Rea [employer] CMA-N     Nuclear Technologist:  Harlow Asa CNMT  Rest Procedure  Myocardial perfusion imaging was performed at rest 45 minutes following the intravenous administration of Myoview Technetium 16m Tetrofosmin.  Stress Procedure  The patient received IV adenosine at 140 mcg/kg/min for 4 minutes. There were no significant changes with infusion. Myoview was injected at the 2 minute mark and quantitative spect images were obtained after a 45 minute delay.  QPS Raw Data Images:  There is no interference from  other nuclear activity. Stress Images:  There is decreased uptake in the anterior wall, apex and lateral wall. Rest Images:  There is decreased uptake in the anterior wall, apex and lateral wall, slightly less promient compared to the stress images. Subtraction (SDS):  These findings are consistent with prior infarct and very mild peri-infarct ischemia. Transient Ischemic Dilatation:  .99  (Normal <1.22)  Lung/Heart Ratio:  .48  (Normal <0.45)  Quantitative Gated Spect Images QGS EDV:  509 ml QGS ESV:  422 ml QGS EF:  17 % QGS cine images:  Global hypokinesis and apical akinesis; severe LVE.   Overall Impression  Exercise Capacity: Adenosine study with no exercise. BP Response: Normal blood pressure response. Clinical Symptoms: No chest pain ECG Impression: Not interpretable due to ventricular pacing. Overall Impression: Abnormal adenosine nuclear study with prior anterior, lateral and apical infarct; very mild peri-infarct ischemia.

## 2010-08-09 NOTE — Letter (Signed)
Summary: [facility] Office Note  [facility] Office Note   Imported By: Roderic Ovens 11/09/2009 13:24:13  _____________________________________________________________________  External Attachment:    Type:   Image     Comment:   External Document

## 2010-08-09 NOTE — Assessment & Plan Note (Signed)
Summary: [email or web address]   Primary Provider:  Martha Clan, MD  CC:  defib check. .  History of Present Illness: [name] is seen in followup for congestive heart failure i8n the seting of  ischemic heart disease with prior pci and EF of 20%  He is status post CRT-D implantation and  he is approachi8ng ERI  He has undergone AV optimization and, he is, as described by Dr. Gala Romney, probably class II at this point. He denies chest pain or peripheral edema.     Current Medications (verified): 1)  Simvastatin 40 Mg Tabs (Simvastatin) .Marland Kitchen.. 1 Once Daily 2)  Carvedilol 25 Mg Tabs (Carvedilol) .... Take One Tablets By Mouth Twice A Day 3)  Altace 5 Mg Caps (Ramipril) .... Take One Tablet By Mouth Twice Daily. 4)  Glimepiride 4 Mg Tabs (Glimepiride) .... Once Daily 5)  Furosemide 40 Mg Tabs (Furosemide) .... Take One Tablet On Mondays, Wedenesdays and Fridays, or As Directed 6)  Spironolactone 25 Mg Tabs (Spironolactone) .... Take One Tablet By Mouth Once Daily. 7)  Niacin 500 Mg Tabs (Niacin) .... 2 Tabs Two Times A Day 8)  Fish Oil   Oil (Fish Oil) .... 2 Tabs Once Daily 9)  Aspirin 81 Mg Tbec (Aspirin) 10)  Ambien Cr 12.5 Mg Cr-Tabs (Zolpidem Tartrate) .... As Needed  Allergies (verified): No Known Drug Allergies  Past History:  Past Medical History: Last updated: 12/02/2008 1. Congestive heart failure secondary to severe ischemic cardiomyopathy.     a.    EF 20% with akinesis of the distal half to two-thirds of the ventricle.  Trivial MR     b.     Status post bi-V ICD.     c.     Cardiopulmonary exercise test pVO2 of 22.1,VE/VCO2 slope is 26,  2. Coronary artery disease, status post multiple myocardial infarctions. 3. History of tobacco use, quit in 2005. 4. Hyperlipidemia. 5. History of left bundle branch block. 6. Diabetes  Past Surgical History: Last updated: 11/25/2008  DATE OF PROCEDURE:  06/15/2004  PROCEDURE:  Contrast venography, upgrade of his single  chamber defibrillator  to a dual chamber defibrillator with resynchronization therapy, pocket  revision, and defibrillator lead assessment.  PHYSICIAN:  Duke Salvia, M.D.   DATE OF PROCEDURE:  08/10/2003 PROCEDURE:  1. Single-chamber defibrillator implantation with intraoperative     defibrillation and threshold testing.  2. Contrast venography.  3. Fluoroscopy.  4. System revision.  PHYSICIAN:  Duke Salvia, M.D.  Family History: Last updated: 11/25/2008  There is some family history of coronary artery disease  Social History: Last updated: 11/25/2008 The patient lives in [place] and is single.  He smoked  two packs per day for 30 years and quit earlier this week.  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  Vital Signs:  Patient profile:   55 year old male Height:      76 inches Weight:      238 pounds BMI:     29.07 Pulse rate:   65 / minute Pulse rhythm:   regular BP sitting:   126 / 75  (left arm) Cuff size:   regular  Vitals Entered By: Judithe Modest CMA (August 30, 2009 1:36 PM)  Physical Exam  General:  The patient was alert and oriented in no acute distress. HEENT Normal.  Neck veins were flat, carotids were brisk.  Lungs were clear.  Heart sounds were regular without murmurs or gallops.  Abdomen was soft with active bowel sounds. There is no clubbing cyanosis or edema. Skin Warm and dry     ICD Specifications Following MD:  Sherryl Manges, MD     Referring MD:  [facility] ICD Vendor:  [facility] [employer]     ICD Model Number:  H177     ICD Serial Number:  [id number] ICD DOI:  06/15/2004     ICD Implanting MD:  Sherryl Manges, MD  Lead 1:    Location: RA     DOI: 06/15/2004     Model #: [id number]     Serial #: [id number]     Status: active Lead 2:    Location: RV     DOI: 08/10/2003     Model #: [id number]     Serial #: [id number]     Status: active Lead 3:    Location: LV     DOI: 06/15/2004     Model #: [id number]     Serial #: [id number]     Status:  active  Indications::  ICD;CHF  Explantation Comments: Latitude  Impression & Recommendations:  Problem # 1:  IMPLANTABLE   DEFIBRILLATOR CRT -BS (ICD-V45.02) Device parameters and data were reviewed and no changes were made; approaching ERI  will follow via latitutde  Problem # 2:  CAD, NATIVE VESSEL (ICD-414.01) without chjest pain His updated medication list for this problem includes:    Carvedilol 25 Mg Tabs (Carvedilol) .Marland Kitchen... Take one tablets by mouth twice a day    Altace 5 Mg Caps (Ramipril) .Marland Kitchen... Take one tablet by mouth twice daily.    Aspirin 81 Mg Tbec (Aspirin)  Problem # 3:  SYSTOLIC HEART FAILURE, CHRONIC (ICD-428.22) stable on current medications His updated medication list for this problem includes:    Carvedilol 25 Mg Tabs (Carvedilol) .Marland Kitchen... Take one tablets by mouth twice a day    Altace 5 Mg Caps (Ramipril) .Marland Kitchen... Take one tablet by mouth twice daily.    Furosemide 40 Mg Tabs (Furosemide) .Marland Kitchen... Take one tablet on mondays, wedenesdays and fridays, or as directed    Spironolactone 25 Mg Tabs (Spironolactone) .Marland Kitchen... Take one tablet by mouth once daily.    Aspirin 81 Mg Tbec (Aspirin)  Patient Instructions: 1)  Your physician recommends that you schedule a follow-up appointment in: 6 months

## 2010-08-09 NOTE — Cardiovascular Report (Signed)
Summary: Office Visit   Office Visit   Imported By: Roderic Ovens 02/28/2010 13:16:15  _____________________________________________________________________  External Attachment:    Type:   Image     Comment:   External Document

## 2010-08-12 NOTE — Cardiovascular Report (Signed)
Summary: Office Visit   Office Visit   Imported By: Roderic Ovens 01/01/2010 12:01:50  _____________________________________________________________________  External Attachment:    Type:   Image     Comment:   External Document

## 2010-08-12 NOTE — Cardiovascular Report (Signed)
Summary: Office Visit Remote   Office Visit Remote   Imported By: Roderic Ovens 01/26/2010 16:13:48  _____________________________________________________________________  External Attachment:    Type:   Image     Comment:   External Document

## 2010-08-31 NOTE — Cardiovascular Report (Signed)
Summary: Office Visit Remote   Office Visit Remote   Imported By: Roderic Ovens 08/23/2010 15:22:27  _____________________________________________________________________  External Attachment:    Type:   Image     Comment:   External Document

## 2010-09-24 LAB — GLUCOSE, CAPILLARY
Glucose-Capillary: 118 mg/dL — ABNORMAL HIGH (ref 70–99)
Glucose-Capillary: 142 mg/dL — ABNORMAL HIGH (ref 70–99)

## 2010-09-24 LAB — SURGICAL PCR SCREEN: MRSA, PCR: NEGATIVE

## 2010-09-27 LAB — CBC
HCT: 41.4 % (ref 39.0–52.0)
Hemoglobin: 14.2 g/dL (ref 13.0–17.0)
MCHC: 34.3 g/dL (ref 30.0–36.0)
MCV: 86.7 fL (ref 78.0–100.0)
Platelets: 163 10*3/uL (ref 150–400)
RBC: 4.78 MIL/uL (ref 4.22–5.81)
RDW: 14.8 % (ref 11.5–15.5)
WBC: 6.9 10*3/uL (ref 4.0–10.5)

## 2010-09-27 LAB — POCT CARDIAC MARKERS
CKMB, poc: 9.6 ng/mL (ref 1.0–8.0)
Myoglobin, poc: 307 ng/mL (ref 12–200)
Troponin i, poc: 0.39 ng/mL (ref 0.00–0.09)

## 2010-09-27 LAB — DIFFERENTIAL
Basophils Absolute: 0 10*3/uL (ref 0.0–0.1)
Basophils Relative: 0 % (ref 0–1)
Eosinophils Absolute: 0 10*3/uL (ref 0.0–0.7)
Eosinophils Relative: 1 % (ref 0–5)
Lymphocytes Relative: 15 % (ref 12–46)
Lymphs Abs: 1 10*3/uL (ref 0.7–4.0)
Monocytes Absolute: 0.5 10*3/uL (ref 0.1–1.0)
Monocytes Relative: 8 % (ref 3–12)
Neutro Abs: 5.3 10*3/uL (ref 1.7–7.7)
Neutrophils Relative %: 77 % (ref 43–77)

## 2010-09-27 LAB — BASIC METABOLIC PANEL
BUN: 15 mg/dL (ref 6–23)
CO2: 27 mEq/L (ref 19–32)
Calcium: 10.2 mg/dL (ref 8.4–10.5)
Chloride: 107 mEq/L (ref 96–112)
Creatinine, Ser: 1.01 mg/dL (ref 0.4–1.5)
GFR calc Af Amer: >60 mL/min (ref >60)
GFR calc non Af Amer: >60 mL/min (ref >60)
Glucose, Bld: 96 mg/dL (ref 70–99)
Potassium: 3.6 mEq/L (ref 3.5–5.1)
Sodium: 138 mEq/L (ref 135–145)

## 2010-11-03 ENCOUNTER — Ambulatory Visit (INDEPENDENT_AMBULATORY_CARE_PROVIDER_SITE_OTHER): Payer: PRIVATE HEALTH INSURANCE | Admitting: *Deleted

## 2010-11-03 ENCOUNTER — Other Ambulatory Visit: Payer: Self-pay

## 2010-11-03 DIAGNOSIS — I428 Other cardiomyopathies: Secondary | ICD-10-CM

## 2010-11-03 DIAGNOSIS — I5022 Chronic systolic (congestive) heart failure: Secondary | ICD-10-CM

## 2010-11-04 ENCOUNTER — Encounter: Payer: Self-pay | Admitting: Internal Medicine

## 2010-11-07 ENCOUNTER — Encounter: Payer: Self-pay | Admitting: Internal Medicine

## 2010-11-07 ENCOUNTER — Ambulatory Visit (INDEPENDENT_AMBULATORY_CARE_PROVIDER_SITE_OTHER): Payer: PRIVATE HEALTH INSURANCE | Admitting: Internal Medicine

## 2010-11-07 VITALS — BP 130/78 | HR 61 | Ht 76.0 in | Wt 245.1 lb

## 2010-11-07 DIAGNOSIS — I5022 Chronic systolic (congestive) heart failure: Secondary | ICD-10-CM

## 2010-11-07 DIAGNOSIS — I509 Heart failure, unspecified: Secondary | ICD-10-CM

## 2010-11-07 DIAGNOSIS — E785 Hyperlipidemia, unspecified: Secondary | ICD-10-CM

## 2010-11-07 DIAGNOSIS — I2510 Atherosclerotic heart disease of native coronary artery without angina pectoris: Secondary | ICD-10-CM

## 2010-11-07 MED ORDER — BISOPROLOL FUMARATE 10 MG PO TABS: ORAL_TABLET | ORAL | Status: DC

## 2010-11-07 NOTE — Patient Instructions (Signed)
Your physician recommends that you schedule a follow-up appointment in: 4 months with Dr. Gala Romney Your physician has recommended you make the following change in your medication: Increase bisoprolol to 10 mg by mouth daily.

## 2010-11-07 NOTE — Progress Notes (Signed)
HPI:  [name] is a 55 year old male with history of congestive heart failure secondary to ischemic cardiomyopathy with an EF of 20%.  He is status post multiple myocardial infarctions and previous stents.  He has a biventricular ICD in place. He under derwent device generator replacement in July 2011.   Remainder of medical history includes diabetes, obesity, hypertension, and a low HDL.   Myoview beforehand which showed EF 17% with markedly dilated LV  EDV: 509 ml ESV: 422 ml. Extensive scar with trivial peri-infarct ischemia.  CPX  12/11 pVO2 24.5 slope 31.7 RER 1.12 O2 pulse 105%   Continue to have problems with exertional dyspnea and fatigue. Occasional CP. Can walk through grocery store without stopping but does get winded. No orthopnea, PND, edema.   ROS: All systems negative except as listed in HPI, PMH and Problem List.  Past Medical History  Diagnosis Date  . CHF (congestive heart failure)     secondary to severe ischemic cardiomyopathy.   a.    EF 20% with akinesis of the distal half to two-thirds of the ventricle.  Trivial MR   b.     Status post bi-V ICD.   c.     Cardiopulmonary exercise test pVO2 of 22.1,VE/VCO2 slope is 26,  . CAD (coronary artery disease)     multiple MIs  . Hyperlipidemia   . Left bundle branch block   . Diabetes mellitus   . ICD (implantable cardiac defibrillator) in place 10/2009    shocks due to sinus tac    Current Outpatient Prescriptions  Medication Sig Dispense Refill  . aspirin 81 MG tablet Take 81 mg by mouth daily.        Marland Kitchen atorvastatin (LIPITOR) 40 MG tablet Take 40 mg by mouth daily.        . bisoprolol (ZEBETA) 5 MG tablet Take 1 & 1/2 tablet by mouth daily       . glimepiride (AMARYL) 4 MG tablet Take 4 mg by mouth daily.        . ramipril (ALTACE) 5 MG capsule Take 5 mg by mouth daily.       Marland Kitchen spironolactone (ALDACTONE) 25 MG tablet Take 25 mg by mouth daily.        Marland Kitchen zolpidem (AMBIEN CR) 12.5 MG CR tablet Take 12.5 mg by mouth as needed.            PHYSICAL EXAM: Filed Vitals:   11/07/10 1152  BP: 130/78  Pulse: 61  General:  The patient was alert and oriented in no acute distress. HEENT Normal.  Neck veins were flat, carotids were brisk.  Lungs were clear.  Heart sounds were regular without murmurs or gallops.  Abdomen was soft with active bowel sounds. There is no clubbing cyanosis or edema. Skin Warm and dry Neuro: affect normal  cranial nerves intact.    ECG: NSR 61 with v-pacing    ASSESSMENT & PLAN:

## 2010-11-07 NOTE — Assessment & Plan Note (Signed)
Goal LDL < 70. Dr. Clelia Croft recently restarted atorvastatin

## 2010-11-07 NOTE — Assessment & Plan Note (Addendum)
Symptomatically NYHA II-III (CPX favors II). Volume status looks good. Titrate bisoprolol to 10 daily.

## 2010-11-07 NOTE — Assessment & Plan Note (Signed)
No evidence of ischemia. Continue current regimen.   

## 2010-11-13 NOTE — Progress Notes (Signed)
icd remote check  

## 2010-11-16 ENCOUNTER — Encounter: Payer: Self-pay | Admitting: *Deleted

## 2010-11-22 NOTE — Assessment & Plan Note (Signed)
[facility]                            CARDIOLOGY OFFICE NOTE   NAME:[name]                      MRN:          [id number]  DATE:05/27/2007                            DOB:          1955-11-04    PRIMARY CARE PHYSICIAN:  Dr. Kari Baars.   INTERVAL HISTORY:  [name] is a very pleasant 55 year old male with a  history of congestive heart failure secondary to ischemic cardiomyopathy  with an EF of 20%.  He is status post multiple myocardial infarction and  several previous stents.  He has a biventricular ICD in place.  The  remainder of his medical history include diabetes, obesity and  hyperlipidemia with a low HDL.  Previous cardiopulmonary exercise test  last year showed a peak VO2 of about 22 mL/kg per minute.   [name] returns today for routine followup.  [name] is having a difficulty  time at work due to the poor economy and business tapering off.  Clinically, he says he has absolutely no energy.  He denies any chest  pain, no significant shortness of breath, but just profound fatigue and  feels that his functional capacity has fallen off some.  He denies any  orthopnea any PND.  He is very worried about his financial status and  his ability to pay for his health insurance and his medications.   MEDICATIONS:  1. Plavix 75 mg a day.  2. Spironolactone 25 mg a day.  3. Lasix 40 mg Monday, Wednesday and Friday.  4. Aspirin 325 mg.  5. Vytorin 10/40.  6. Altace 5 mg b.i.d.  7. Coreg 50 b.i.d.  8. Amaryl 1 mg a day.  9. Ambien CR 12.5 mg a day.   PHYSICAL EXAMINATION:  He is in no acute distress.  He ambulates around  the clinic without any respiratory distress.  Blood pressure is 120/72, heart rate 60, weight is 237.  HEENT:  Normal.  NECK:  Supple. There is no JVD.  Carotids are 2+ bilaterally without any  bruits.  There is no lymphadenopathy or thyromegaly.  CARDIAC:  PMI is laterally displaced.  He has a regular rate and rhythm,  no murmurs, no S3.  LUNGS:  Clear.  ABDOMEN:  Soft, nontender and non-distended.  No hepatosplenomegaly.  No  bruits.  No masses.  Good bowel sounds.  EXTREMITIES:  Warm with no cyanosis, clubbing or edema.  No rash.  NEUROLOGIC:  Alert and oriented x3.  Cranial nerves II-XII are intact.  He moves all 4 extremities without difficulty.  Affect is pleasant.   EKG shows sinus rhythm with a ventricular pacing at a rate of 60.   Echocardiogram done today shows an ejection fraction of 20% to 25%.  His  only movement is at the base.  The distal two-thirds of his ventricle is  essentially akinetic.  He has mild mitral regurgitation.  His end-  diastolic diameter is about 73 mm and systolic is about 64; this is up  about 10 mm from last year.   ASSESSMENT AND PLAN:  1. Congestive heart failure secondary to ischemic cardiomyopathy.  His  ejection fraction continues to remain very low.  His further care      seems to be declining somewhat.  There is evidence of negative      remodeling on his echocardiogram.  I suggested that we repeat his      cardiopulmonary exercise test to see what his functional capacity      is doing objectively and to see if we need to re-triage him for a      transplant.  Unfortunately, due to financial issues, he is not able      to afford this and would like to postpone until at least February.      From a volume standpoint, he looks okay.  His functional class is      New York Heart Association class III.  2. Hyperlipidemia, followed by Dr. Clelia Croft.  He is on a good medical      regimen.  3. Coronary artery disease.  This is stable without any evidence of      ischemic.  He will continue current therapy.   I spent approximately 45 minutes with him discussing his echocardiogram  as well as the options and his financial situations.   DISPOSITION:  Return to clinic in 3 months.     Bevelyn Buckles. Bensimhon, MD  Electronically Signed    DRB/MedQ  DD: 05/27/2007   DT: 05/28/2007  Job #: [id number]   cc:   Kari Baars, M.D.

## 2010-11-22 NOTE — Assessment & Plan Note (Signed)
[facility]                            CARDIOLOGY OFFICE NOTE   NAME:[name], [name]                      MRN:          [id number]  DATE:08/14/2007                            DOB:          [birth year]    PRIMARY CARE PHYSICIAN:  Dr. Eric Form.   INTERVAL HISTORY:  [name] is very pleasant 55 year old male with history  of congestive heart failure secondary to ischemic cardiomyopathy an EF  of 20%.  He is status post multiple myocardial infarctions and several  previous stents.  He is a biventricular ICD in place.  Remainder of his  medical history includes diabetes, obesity and hyperlipidemia with a low  HDL.  Previous CPX testing showed a peak VO2 of 22 mL/kg per minute.  However, he did have a recent echocardiogram which showed a stable EF  though LV was dilating.   Recently saw Dr. Graciela Husbands in electrophysiology clinic who is going to bring  him in for attempted AV optimization of his BiV ICD.  Otherwise he is  doing fairly well.  Denies any chest pain or shortness of breath.  He  has not seen any change in his functional capacity.  Able to do most  things he wants, as long as he goes at a slow pace.  Has not had  orthopnea, no PND.   CURRENT MEDICATIONS:  Plavix 75 a day, spirolactone 25 a day, Lasix 40  mg Monday Wednesday and Friday, aspirin 325 a day, Vytorin 10/40, Altace  5 b.i.d., Coreg 50 b.i.d. Amaryl 2 mg a day and Ambien CR 12.5 at night.   PHYSICAL EXAM:  He is well-appearing no acute distress.  Ambulates  around clinic without respiratory difficulty.  Blood pressure is 114/62, heart rate 58, weight was 241.  HEENT is normal.  NECK:  Supple.  No JVD.  Carotid 2+ bilaterally bruits.  There is no  lymphadenopathy or thyromegaly.  CARDIAC:  PMI is laterally displaced.  Regular rate and rhythm.  No  murmurs, no S3.  LUNGS:  Clear.  ABDOMEN:  Soft, nontender, nondistended.  No hepatosplenomegaly.  No  recent masses.  Good bowel sounds.  EXTREMITIES:  Warm with sinus clubbing or edema.  No rash.  NEUROLOGIC:  Alert and oriented x3.  Cranial nerves II-XII are intact.  Moves all fours extremities without difficulty.  Affect is pleasant.   EKG shows sinus rhythm with a ventricular pacing.  Rate is 51.   ASSESSMENT/PLAN:  1. Congestive heart failure secondary ischemic cardiomyopathy.  He is      NYHA functional class II and remains euvolemic.  However, I do      worry about some a negative remodeling he had and agree with      possible AV optimization.  I did suggest repeating his      cardiopulmonary exercise testing to get a clear idea were he is on      the spectrum of possible transplant.  However, he like to defer      this and also would like to wait until May.  We will schedule him  for then.  2. Hyperlipidemia as followed by Dr. Clelia Croft.  Goal is to keep his LDL      under 70 which it has been.  Continue statin.  3. Coronary artery disease is stable.  No evidence of ischemia.      Continue current therapy.   DISPOSITION:  See him back in 3 months and hopefully we can to the PCP X  testing at that time.     Bevelyn Buckles. Bensimhon, MD  Electronically Signed    DRB/MedQ  DD: 08/14/2007  DT: 08/15/2007  Job #: [id number]

## 2010-11-22 NOTE — Assessment & Plan Note (Signed)
[facility]                         ELECTROPHYSIOLOGY OFFICE NOTE   NAME:[name]                      MRN:          [id number]  DATE:08/14/2008                            DOB:          [birth year]    [name] is seen in followup for congestive heart failure for which  he follows very closely by Dr. Gala Romney.  He saw him just a couple of  months ago.  He has ischemic heart disease.  He is status post CRT  implantation.  He has undergone AV optimization and, he is, as described  by Dr. Gala Romney, probably class II at this point.   MEDICATIONS:  Unchanged, they include:  1. Plavix.  2. Spironolactone.  3. Lasix.  4. Vytorin.  5. Altace.  6. Coreg 50 b.i.d.  7. Glimepiride.  8. Fish oil.  9. Niacin.   PHYSICAL EXAMINATION:  VITAL SIGNS:  His blood pressure was 96/62 with a  pulse of 66.  LUNGS:  Clear.  HEART:  Sounds were regular.  EXTREMITIES:  Without edema.   Interrogation of his Guidant ICD demonstrates a P-wave of 5.1 with  impedance of 428 and a threshold was 0.6 at 0.4.  The R-wave was 25 with  impedance of 567, a threshold of 0.8 at 0.4, which is also at the LV  threshold with impedance of 641 and L-wave of 24.4.  Battery voltage was  2.58 representing status MOL2.  There are no intercurrent therapies.   IMPRESSION:  1. Ischemic cardiomyopathy with;      a.     Prior myocardial infarction.      b.     Prior percutaneous coronary intervention.      c.     Ejection fraction about 20%.  2. Congestive heart failure - chronic - systolic - class II.  3. Status post cardiac resynchronization therapy and atrioventricular      optimization.  4. Diabetes.  5. Borderline hypertension.   Mr. Yu is stable.  I should note that his CPX have also been stable  over the last year and half with a VO2 max at 22.   We will plan to see him again in 1 year's time and will be followed for  his device in the clinic every 3  months.     Duke Salvia, MD, [facility]  Electronically Signed    SCK/MedQ  DD: 08/14/2008  DT: 08/15/2008  Job #: [id number]

## 2010-11-22 NOTE — Assessment & Plan Note (Signed)
[facility]                         ELECTROPHYSIOLOGY OFFICE NOTE   NAME:[name], [name]                      MRN:          [id number]  DATE:08/23/2007                            DOB:          01-01-1956    [name] was admitted for AV optimization.  After review, it appeared  that 150 milliseconds to 120 with his dynamic was appropriate to  maximize atrial filling.     Duke Salvia, MD, [facility]  Electronically Signed    SCK/MedQ  DD: 08/23/2007  DT: 08/25/2007  Job #: [id number]

## 2010-11-22 NOTE — Assessment & Plan Note (Signed)
[facility]                         GASTROENTEROLOGY OFFICE NOTE   NAME:[name], [name]                      MRN:          [id number]  DATE:11/15/2006                            DOB:          [birth year]    REASON FOR CONSULTATION:  Surveillance colonoscopy.   HISTORY:  This is a 55 year old white male with a history of coronary  artery disease status post myocardial infarction with heart failure,  status post right coronary artery drug-eluting stent placement in 2005,  status post AICD placement with subsequent replacement by AICD/pacemaker  combination unit, diabetes mellitus, and previous tobacco abuse. He also  has a history of multiple colon polyps removed about 7 years ago. At  that time, he apparently had some sebaceous cysts around the anal canal.  He underwent a rectal examination which demonstrated a polyp. He had  several polyps removed at colonoscopy, but apparently had Dr.  Kendrick Ranch  remove a larger rectal lesion. No reports from his colonoscopy, surgery  or pathology available at this time. He was told to followup in two  years, but failed to do so. Subsequently, he had problems with his  heart. He is now more health conscious and desires preventative care.  The patient is on aspirin and Plavix for his drug-eluting stent.   GI REVIEW OF SYSTEMS:  However, is quite unremarkable. No nausea,  vomiting, abdominal pain, constipation, diarrhea, melena, or  hematochezia. His weight has been stable.   PAST MEDICAL HISTORY:  As above.   PAST SURGICAL HISTORY:  As above.   ALLERGIES:  No known drug allergies.   CURRENT MEDICATIONS:  1. Plavix 75 mg daily.  2. Spironolactone 25 mg daily.  3. Lasix 40 mg Monday, Wednesday and Friday as needed.  4. Aspirin 325 mg daily.  5. Vytorin 10/40 mg daily.  6. Altace 5 mg b.i.d.  7. Coreg 50 mg b.i.d.  8. Amaryl 2 mg daily.  9. He also uses nitroglycerine p.r.n.  10.TUMS p.r.n.   FAMILY  HISTORY:  No family history of gastrointestinal malignancy.   SOCIAL HISTORY:  The patient is single without children. Lives alone. 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 The patient does not smoke currently, but was smoking three  packs of cigarettes per day for 30 years prior. He does not use alcohol.   REVIEW OF SYSTEMS:  Per Diagnostic Evaluation form.   PHYSICAL EXAMINATION:  Well-appearing male in no acute distress. He is  alert and oriented. His blood pressure is 80/50, heart rate 60 and  regular, weight 244.2 pounds. He is 6 feet, 4 inches in height.  HEENT: Sclerae anicteric. Conjunctivae are pink. Oral mucosa intact. No  adenopathy.  LUNGS:  Are clear to auscultation and percussion.  HEART: Is regular.  ABDOMEN: Soft, nontender with good bowel sounds. No organomegaly, mass  or hernia.  His implantable defibrillator pacemaker unit is palpable over the left  clavicular region.  EXTREMITIES: Are without edema.  NEUROLOGIC: He is intact.   IMPRESSION:  1. History of colon polyps. Details unavailable. Apparently, overdue  for surveillance.  2. Significant cardiac history as discussed above. Currently on      aspirin and Plavix. Also, a implantable defibrillator in place.   RECOMMENDATIONS:  The patient is an appropriate candidate for  colonoscopy. However, special issues include management of his  anticoagulation, or antiplatelet therapy, as well as the presence of his  implantable defibrillating device. We discussed in detail the pros and  cons of temporary discontinuation of antiplatelet therapy. After this,  it has been elected to proceed with his colonoscopy on Plavix and  aspirin. He understands that small polyps can be dealt with in this  setting. However, larger polyps may require repeat colonoscopy at a  different time off Plavix. The nature of the procedure as well as the  risks, benefits and alternatives were  reviewed in great detail. He  understood and agreed to proceed. We will provide him with a PEG  solution in an effort to minimize the risk of electrolyte imbalance or  fluid overload.     Wilhemina Bonito. Marina Goodell, MD  Electronically Signed    JNP/MedQ  DD: 11/15/2006  DT: 11/15/2006  Job #: [id number]   cc:   [name], M.D.

## 2010-11-22 NOTE — Assessment & Plan Note (Signed)
[facility]                         ELECTROPHYSIOLOGY OFFICE NOTE   NAME:Koopmann, CUSTER PIMENTA                      MRN:          [id number]  DATE:08/07/2007                            DOB:          07/15/1955    HISTORY:  Mr. Catalfamo is seen today for his congestive heart failure in  the setting of ischemic heart disease.  He is status post CRT  implantation.  He remains functionally modestly impaired.  Reviewing his  symptoms following his CRT implant in 2005, he thinks that his  functional capacity had been relatively stable and much improved.  However, the echo that he had done in November was concerning because of  ongoing evidence of significant remodeling with a further 10-15%  increase in his left ventricular dimensions.   Because of this, I have suggested that we undertake AV optimization echo  to see if there is anything that we can do from a pacing programming  point of view to improve the situation.  He is agreeable.   MEDICATIONS:  1. Plavix.  2. Aldactone.  3. Lasix.  4. Vytorin 10/40.  5. Altace.  6. Coreg 50 b.i.d.  7. Glimepiride.   PHYSICAL EXAMINATION:  VITAL SIGNS:  His blood pressure today was quite  low at 86/60.  It was 120/72 when  he was seen the last time and his  pulse was 66.  LUNGS:  Clear.  HEART:  Sounds were regular.  EXTREMITIES:  Without edema.   Interrogation of his Guidant ICD demonstrates a P-wave of 5.9 with  impedance of 441, a threshold 0.6 to 0.4, the R-wave was 25 with  impedance of 581, a threshold 0.8 to 0.4 in both chambers, the LV  impedance was 659, battery voltage was 2.65.  There are no intercurrent  episodes.   IMPRESSION:  1. Congestive heart failure - chronic - systolic.  2. Ischemic heart disease with:      a.     Prior myocardial infarction.      b.     Prior percutaneous coronary intervention.      c.     Ejection fraction 20% with progressive left ventricular       remodeling.  3.  Status post cardiac resynchronization therapy.  4. Diabetes.  5. Relative hypotension.   DISCUSSION:  Mr. Feria today is doing quite well and his symptoms  belie his blood pressure.  I am concerned about the remodeling, however,  and we discussed AV optimization, echo and we will plan to proceed that  way at the patient's convenience.     Duke Salvia, MD, [employer]  Electronically Signed    SCK/MedQ  DD: 08/07/2007  DT: 08/07/2007  Job #: [id number]   cc:   Kari Baars, M.D.

## 2010-11-22 NOTE — Assessment & Plan Note (Signed)
[facility]                            CARDIOLOGY OFFICE NOTE   NAME:Navarra, [name]                      MRN:          [id number]  DATE:07/16/2008                            DOB:          [birth year]    PRIMARY CARE PHYSICIAN:  Kari Baars, MD   INTERVAL HISTORY:  Song is a very pleasant 55 year old male with  history of congestive heart failure secondary to ischemic cardiomyopathy  with an EF of 20%.  He is status post multiple myocardial infarctions  and previous stents.  He has a biventricular ICD in place.  Remainder of  medical history includes diabetes, obesity, hypertension, and a low HDL.  He recently underwent a CTX test, which showed no significant change.  Peak pO2 is in the 20-22 range, although I do not have the exact test  with me.   From a heart failure point of view, overall he is quite stable.  He  continues to get dyspnea on exertion, but modulate his activity.  He can  do most of the things that he used to do without any significant  problems.  No orthopnea or PND, no chest pain, no lower extremity edema.  He does note that his glucose is elevated since Dr. Clelia Croft increased his  Niaspan.   REVIEW OF SYSTEMS:  He is having occasional problems with a pilonidal  cyst and has seen Dr. Derrell Lolling for it recently.  He is holding off on  surgery for now.  He also has arthritis.  Otherwise, all systems are  negative.   CURRENT MEDICATIONS:  1. Plavix 75 a day.  2. Spirolactone 25 a day.  3. Lasix 40 mg Monday, Wednesday, and Friday p.r.n.  4. Aspirin 325 a day.  5. Vytorin 10/40.  6. Altace 5 b.i.d.  7. Coreg 50 b.i.d.  8. Ambien p.r.n.  9. Glimepiride 4 mg at night.  10.Fish oil 2000 mg daily.  11.Niacin 1 g b.i.d.   PHYSICAL EXAMINATION:  GENERAL:  He is well appearing in no acute  distress.  Ambulates around the clinic without any respiratory  difficulty.  VITAL SIGNS:  Blood pressure is 102/65, heart rate is 60, weight is  245  which is stable.  HEENT:  Normal.  NECK:  Supple.  No JVD.  Carotids are 2+ bilaterally without any bruits.  There is no lymphadenopathy or thyromegaly.  CARDIAC:  PMI is laterally displaced.  He has a regular rate and rhythm.  No murmurs, rubs, or gallops.  LUNGS:  Clear.  ABDOMEN:  Obese, nontender, nondistended.  No hepatosplenomegaly, no  bruits, no masses.  EXTREMITIES:  Warm with no cyanosis, clubbing, or edema.  NEUROLOGIC:  Alert and oriented x3.  Cranial nerves II through XII are  intact.  Moves all four extremities without difficulty.  Affect is  pleasant.   EKG shows sinus rhythm at a rate of 62 with ventricular pacing.   ASSESSMENT AND PLAN:  1. Congestive heart failure secondary to ischemic cardiomyopathy.      Volume status looks good.  He is New York Heart Association class  II.  His pO2 is stable.  He is on good medicines and he will      continue.  2. Coronary artery disease, stable.  Continue current therapy.  3. Hyperlipidemia, this is followed by Dr. Clelia Croft.  I agree with his      current regimen and may need to go up on his diabetic regimen a bit      to help control his sugars.   DISPOSITION:  We will see him back in 6 months for routine followup.     Bevelyn Buckles. Bensimhon, MD  Electronically Signed    DRB/MedQ  DD: 07/16/2008  DT: 07/17/2008  Job #: [id number]

## 2010-11-22 NOTE — Assessment & Plan Note (Signed)
[facility]                            CARDIOLOGY OFFICE NOTE   NAME:[name], [name]                      MRN:          [id number]  DATE:02/04/2007                            DOB:          Apr 18, 1956    PRIMARY CARE PHYSICIAN:  Kari Baars, M.D.   INTERVAL HISTORY:  [name] is a very pleasant 55 year old male with  a history of congestive heart failure secondary to ischemic  cardiomyopathy with an EF of 20%.  He is status post multiple myocardial  infarctions and has several previous stents.  He also has a  biventricular ICD in place.  Remainder of medical history includes  diabetes, obesity, and dyslipidemia with a low HDL.   [name] returns today for routine followup.  He is doing quite well.  He  remains somewhat active.  Denies any chest pain or shortness of breath.  He has not had any lower extremity edema, orthopnea, or PND.  His ICD  has not fired.  He does note that, occasionally, when he stands up  quickly, he feels light-headed, but he has not passed out.   CURRENT MEDICATIONS:  1. Plavix 75 a day.  2. Spironolactone 25 a day.  3. Lasix 40 mg Monday, Wednesday, Friday.  4. Aspirin 325 a day.  5. Vytorin 10/40.  6. Altace 5 b.i.d.  7. Coreg 50 b.i.d.  8. Glimepiride 2 mg a day.  9. Ambien CR 12.5 mg a day.   PHYSICAL EXAM:  He is well-appearing.  In no acute distress.  He  ambulates around the clinic without any respiratory difficulty.  Blood pressure is 104/64, heart rate is 55, weight is 234.  HEENT:  Normal.  NECK:  Supple.  There is no JVD.  Carotids are 2+ bilaterally without  any bruits.  There is no lymphadenopathy or thyromegaly.  CARDIAC:  PMI is mildly laterally displaced.  There is a regular rate  and rhythm.  No murmurs, rubs, or gallops.  LUNGS:  Clear.  ABDOMEN:  Soft, nontender, nondistended.  No hepatosplenomegaly.  No  bruits.  No masses.  Good bowel sounds.  EXTREMITIES:  Warm with no cyanosis, clubbing,  or edema.  NEUROLOGIC:  He is alert and oriented x3.  Cranial nerves 2-12 are  intact.  He moves all 4 extremities without difficulty.  Affect is  pleasant.   LABORATORY:  Show a creatinine of 1.0, potassium of 4.3.  Total  cholesterol of 100, triglycerides 92, HDL 32, and LDL 50.   ASSESSMENT AND PLAN:  1. Congestive heart failure secondary to ischemic cardiomyopathy,      ejection fraction of 20%.  He remains stable at NYHA class II      symptoms.  He is on a good medical regimen.  His blood pressure is      a little bit on the low side, but he is not overly symptomatic.      Should he become more symptomatic, we will consider decreasing his      Lasix and, perhaps, maybe decreasing his Coreg just minimally.  2. Hypertension, well-controlled.  3. Hyperlipidemia.  LDL is at goal.  HDL remains low.  I have talked      to him about niacin.  He is not interested at this point.  4. Coronary artery disease.  This is stable without any evidence of      ongoing ischemia.   DISPOSITION:  We will see him back in 4 to 5 months, and he will go for  his yearly echocardiogram.     Bevelyn Buckles. Bensimhon, MD  Electronically Signed    DRB/MedQ  DD: 02/04/2007  DT: 02/05/2007  Job #: [id number]   cc:   Kari Baars, M.D.

## 2010-11-22 NOTE — Assessment & Plan Note (Signed)
[facility]                            CARDIOLOGY OFFICE NOTE   NAME:[name], [name]                      MRN:          [id number]  DATE:11/21/2007                            DOB:          October 22, 1955    PRIMARY CARE PHYSICIAN:  Dr. Eric Form.   INTERVAL HISTORY:  [name] is a very pleasant 55 year old with history  congestive heart failure secondary ischemic cardiomyopathy with an EF of  20%.  He is status post multiple myocardial infarctions and previous  stents.  He has a biventricular ICD in place.  Remainder of medical  history includes diabetes, obesity, hyperlipidemia with a low HDL,  previous CPX testing showed a peak VO2 of 22.   Overall, he continues to be stable.  He does get some dyspnea on  exertion, but modulates his activity, and can do most of the things he  needs to do without any significant problems.  No orthopnea or PND.  No  chest pain.  No lower extremity edema.   REVIEW OF SYSTEMS:  He does have occasional orthostasis.  He is also  having problems with a pilonidal cyst and some arthritis.  Remainder of  review of systems otherwise negative.   CURRENT MEDICATIONS:  1. Plavix 75 a day.  2. Spirolactone 25 a day.  3. Lasix 40 mg Monday, Wednesday and Friday.  4. Aspirin 325.  5. Vytorin 10/40.  6. Altace 5 b.i.d.  7. Coreg 50 b.i.d.  8. Ambien CR 12.5.  9. Glimepiride 2 mg a day.   PHYSICAL EXAMINATION:  GENERAL:  He is well-appearing, no acute  distress, ambulates around the clinic without respiratory difficulty.  VITAL SIGNS:  Blood pressure is 96/56, heart rate 60, weight 244 which  is stable.  HEENT:  Normal.  NECK:  Supple.  There is no JVD.  Carotid 2+ bilaterally without any  bruits.  There is no lymphadenopathy or thyromegaly.  CARDIAC:  PMI is laterally displaced.  Regular rate and rhythm.  No  murmurs, no S3.  LUNGS:  Clear.  ABDOMEN:  Obese, nontender, nondistended.  No hepatosplenomegaly, no  bruits, no  masses.  Good bowel sounds.  EXTREMITIES:  Warm.  With no cyanosis, clubbing or edema.  No rash.  NEUROLOGICAL:  Alert and oriented x3.  Cranial nerves II-XII intact.  Moves all four extremities without difficulty.  Affect is pleasant.   ASSESSMENT/PLAN:  1. Congestive heart failure secondary to ischemic cardiomyopathy.      Continues to be NYHA functional class I and euvolemic.  We will get      a CPX test to make sure his VO2 is stable.  He is on good      medicines.  2. Coronary artery disease, stable without any evidence of ischemia.      Continue current therapy.  Goal LDL is less than 70.  Lipids are      followed by Dr. Clelia Croft.     Bevelyn Buckles. Bensimhon, MD  Electronically Signed    DRB/MedQ  DD: 11/21/2007  DT: 11/21/2007  Job #: [id number]

## 2010-11-25 NOTE — H&P (Signed)
[name], [name]                         ACCOUNT NO.:  [id number]   MEDICAL RECORD NO.:  [id number]                   PATIENT TYPE:  INP   LOCATION:  1824                                 FACILITY:  [facility]   PHYSICIAN:  Willa Rough, M.D.                  DATE OF BIRTH:  21-Mar-1956   DATE OF ADMISSION:  07/23/2003  DATE OF DISCHARGE:                                HISTORY & PHYSICAL   HISTORY OF PRESENT ILLNESS:  The patient is a 55 year old gentleman.  There  is no prior documented coronary artery disease.  The patient smoked for 30  years.  He electively quit on his own several days ago and he is having no  withdrawal symptoms.  The patient noticed approximately two weeks ago that  he had discomfort between his shoulder blades and some chest discomfort.  Since that time, he has had intermittent chest discomfort and intermittent  shortness of breath.  He has had no nausea, vomiting, or diaphoresis.  He  came in to be assessed today at an [facility] and he had a left  bundle branch block and there was significant concern and he was sent for  further evaluation.  Also it appeared that he had a nice response rapidly to  nitroglycerin.   ALLERGIES:  No known drug allergies and no shellfish allergies.   MEDICATIONS:  He is on no medications.   PAST MEDICAL HISTORY:  See the complete list below.   SOCIAL HISTORY:  The patient lives in [place] and is single.  He smoked  two packs per day for 30 years and quit earlier this week.  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   FAMILY HISTORY:  There is some family history of coronary artery disease.   REVIEW OF SYSTEMS:  The patient has had no fevers or chills.  He has had no  significant HEENT problems.  There are no skin rashes.  He has had chest  pain and shortness of breath as described.  There have been no GU symptoms.  He has had no major musculoskeletal symptoms.  He does have occasional  reflux.  The remainder of his review of systems is negative.   PHYSICAL EXAMINATION:  VITAL SIGNS:  Temperature 98 with a pulse of 92, and  respirations 20, blood pressure initially of 170/73 and currently 125/80.  HEENT:  No marked abnormalities.  NECK:  No obvious bruits.  No obvious lymph nodes are felt.  HEART:  S1 and S2, but no murmurs.  LUNGS:  Some rhonchi.  I do not hear definite rales.  SKIN:  No obvious rashes.  ABDOMEN:  Soft with no significant abnormalities.  GENITOURINARY:  RECTAL:  Deferred.  No peripheral edema.  No major  musculoskeletal deformities.  NEUROLOGY:  Grossly intact.   EKG shows left bundle branch block that is new since 1998.  Chest x-ray is  read as some pulmonary vascular congestion.   LABORATORY DATA:  Hemoglobin 14, BUN 16, and creatinine of 0.9.  His INR is  pending.  He has a BNP of 1450.  Point of care enzymes reveal that his  myoglobin is in the normal range, troponins are 0.11, 0.09, and 0.19.   PROBLEM LIST:  1. History of colonoscopy with polypectomy (benign) in the past.  2. Longterm heavy tobacco use which he quit three days ago.  3. 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  4. Left bundle branch block new since 1998.  5. BNP of 1500.  6. Chest discomfort.   It would appear that he had a possible out of hospital MI in the past two  weeks.  Until we gather more information about his left ventricular function  and his coronary arteries, we will not know anymore.  The patient is  admitted.  He will receive anticoagulation per the Acuity Trial.  Because  there was question that he was wet, he received Lasix and did not diurese  significantly.  Based on all of his data at this time, I am not convinced  that he is volume overloaded.  He will receive some modest IV fluid and  receive p.o. fluid.  BMET will be checked in the morning.  He will have  standard cardiac enzymes to parallel his point of care enzymes.  EKG to be  repeated in  the morning and a two-dimensional echocardiogram will be done to  assess his LV function.  We will also push for cardiac catheterization on  July 24, 2003.  All of the orders are written.                                                Willa Rough, M.D.    Cleotis Lema  D:  07/23/2003  T:  07/23/2003  Job:  [id number]

## 2010-11-25 NOTE — Cardiovascular Report (Signed)
[name], [name]                         ACCOUNT NO.:  [id number]   MEDICAL RECORD NO.:  [id number]                   PATIENT TYPE:  INP   LOCATION:  3714                                 FACILITY:  [facility]   PHYSICIAN:  Carole Binning, M.D. [facility]         DATE OF BIRTH:  16-Nov-1955   DATE OF PROCEDURE:  08/03/2003  DATE OF DISCHARGE:                              CARDIAC CATHETERIZATION   PROCEDURES PERFORMED:  1. Left heart catheterization with coronary angiography and left     ventriculography.  2. 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   INDICATION:  [name] is a 55 year old male who presented to the hospital  a week and a half ago following an out of hospital myocardial infarction.  Cardiac catheterization at that time revealed a total occlusion of the mid-  LAD of unknown duration.  He had a 99% stenosis in the proximal right  coronary artery with thrombus.  Ejection fraction was 8%.  An intra-aortic  balloon pump was placed and he underwent PTCA with stent placement in the  proximal right coronary artery.  The patient has been managed medically  since then and has shown clinical improvement.  We did a thallium viability  study to look for viability in the LAD distribution.  This showed extensive  infarct in the anterior and apical walls, but there was presence of  viability in the septum.  We therefore opted to proceed with attempt at PTCA  of the LAD.   PROCEDURE:  A 6-French sheath was placed in the right femoral artery.  Selective coronary angiography of the right coronary artery was performed  with a 6-French JR-4 catheter.  Left ventriculography was performed with an  angled pigtail catheter.  We then proceed with our attempt at PTCA of the  LAD.  Angio-Max was administered per protocol.  We used a 6-French JL-4  guiding catheter.  We initially attempted to cross the occlusion with a PT-2  light support wire;  however, this would not cross beyond the occlusion.  Of  note, there was a septal perforator which arose at the site of the occlusion  and the wire preferentially tracked down into the septal perforator.  We  advanced a 2.0 x 15 mm Maverick balloon over the wire to use the balloon as  back-up support but, again, we were unable to cross the lesion with this  wire.  We then used a cross at 100 coronary guidewire with the balloon back-  up support and were able to advance this limited degree into the occlusion;  however, it would not cross the occlusion completely.  We then went back in  with a Ashai Miracle 4.5 wire with balloon back-up support and we did  advance this wire beyond the occlusion in what appeared to be the lumen of  the vessel distally.  We then advanced our balloon  over the wire which had  some difficulty tracking.  We removed the wire from the balloon and injected  dye through the lumen of the balloon and this did show an opacification of  the distal vessel bed suggesting that we were in the lumen distally.  We,  therefore, performed a low pressure inflation with the balloon over the site  of the original occlusion at 4 atmospheres.  We pulled back the balloon and  administered intracoronary nitroglycerin.  Angiographic images at that point  revealed that the wire was actually in a dissection plane at the site of the  occlusion and then appeared to re-enter the lumen distally.  As it was  apparent that we were in dissection plane and would be unable to re-enter  the true lumen at the site of the occlusion, we therefore aborted the  procedure at that point.  The balloon and guidewire were removed.  Final  angiographic images revealed a persistent occlusion of the LAD in the mid  vessel.  There was some minimal anterograde flow through the dissection  plane filling the distal vessel, but the vessel will remain functionally  occluded at the site of the original occlusion.  There  was no evidence of  contrast extravasation.   COMPLICATIONS:  None.   RESULTS:  1. Unsuccessful attempt at angioplasty of a chronic total occlusion of the     mid-LAD due to factors described above.  2. Angiographic images of the right coronary artery revealed a patent stent     in the right vessel.  There were two areas of what appeared to be plaque     protrusion within the stent with less than 10% stenosis at these sites.     These then were right in the segment of vessel and likely represented     either protrusion of plaque or thrombus through the stent struts,     however, this did not appear to be compromising flow in any form.  3. Left ventriculogram revealed severe akinesis of the anterior and apical     walls with severe hypokinesis of the inferior wall.  Ejection fraction is     calculated at 13%.  There is no mitral regurgitation.   PLAN:  The patient will be managed medically.  Due to limited viability in  the LAD distribution it appears that the risk of bypass surgery would  outweigh the potential benefits.  We will, therefore, attempt to manage him  medically for his severe LV dysfunction.  Depending on his clinical status,  the patient may be referred for evaluation for cardiac transplant.                                               Carole Binning, M.D. [facility]    MWP/MEDQ  D:  08/03/2003  T:  08/03/2003  Job:  [id number]   cc:   Cardiac Cath Lab

## 2010-11-25 NOTE — Discharge Summary (Signed)
NAMEOSINACHI, NAVARRETTE                         ACCOUNT NO.:  [id number]   MEDICAL RECORD NO.:  [id number]                   PATIENT TYPE:  INP   LOCATION:  2010                                 FACILITY:  [facility]   PHYSICIAN:  Willa Rough, M.D.                  DATE OF BIRTH:  [birth year]   DATE OF ADMISSION:  07/23/2003  DATE OF DISCHARGE:  08/12/2003                                 DISCHARGE SUMMARY   ADDENDUM:  Mr. Kohen had an ICD inserted on August 10, 2003.  There was  some oozing from the site, and this was not well-controlled by a pressure  dressing, so he was held overnight.  The next day, there was no hematoma at  the insertion site, and his physical exam was within normal limits.  Dr.  Graciela Husbands evaluated Mr. Clabo and felt that he was stable for discharge from  an EP perspective, but needed to hold his Coumadin for 48 hours, which would  mean resuming it on August 14, 2003, and the patient was instructed in  this.  Dr. Gerri Spore evaluated the patient and felt that he was stable for  discharge from a cardiac standpoint, with an INR at discharge of 1.8.  Mr.  Mooney was considered stable for discharge on August 12, 2003, and has  follow-up appointments arranged with the Coumadin clinic on Monday, August 17, 2003 at 12:15 p.m., and with his other appointments as previously  scheduled.  Discharged to Coumadin dosage 7.5 mg q.d. to be restarted on  August 14, 2003.      Theodore Demark, P.A. [facility]                  Willa Rough, M.D.    RB/MEDQ  D:  08/12/2003  T:  08/12/2003  Job:  [id number]   cc:   Nance Pew, M.D. [facility]

## 2010-11-25 NOTE — Discharge Summary (Signed)
NAMEBRAELYN, [name]                         ACCOUNT NO.:  [id number]   MEDICAL RECORD NO.:  [id number]                   PATIENT TYPE:  INP   LOCATION:  2010                                 FACILITY:  [facility]   PHYSICIAN:  [name], M.D.                  DATE OF BIRTH:  1956/04/15   DATE OF ADMISSION:  07/23/2003  DATE OF DISCHARGE:  08/12/2003                                 DISCHARGE SUMMARY   PROCEDURES:  1. Cardiac catheterization.  2. Coronary arteriogram.  3. Left ventriculogram.  4. Percutaneous transluminal coronary angioplasty and TAXUS stent to 1     vessel.  5. Two-dimensional echocardiogram.  6. Twenty-four-hour thallium study.  7. Insertion of implantable cardioverter-defibrillator.   HOSPITAL COURSE:  [name] is a 55 year old male with no known history of  coronary artery disease.  He had onset of discomfort between his shoulder  blades described as aggravating that was not associated with any nausea or  diaphoresis but was associated with shortness of breath and greatly  increased dyspnea on exertion.  He went to a [facility] on the day of  admission because he had been persuaded to go by his family and friends.  He  was still having some discomfort which was relieved with nitroglycerin.  His  EKG was abnormal and it was felt that he had had an out-of-hospital MI and  he was transferred to [facility] for further evaluation and  treatment.   His enzymes were abnormal upon admission with a troponin of 0.19 and a CK-MB  of 6.4.  His BNP was also significantly elevated at 1450.  It was felt that  he needed diuresis and a heart catheterization was scheduled for further  evaluate his coronary anatomy.   The cardiac catheterization showed an LAD that was totalled in the  midportion and a circumflex that was without critical disease.  The OM had a  nonobstructive stenosis and the RCA had a 95% stenosis proximally with  thrombus.  There was also  ulcerated plaque.  He had the PDA and the PLA that  were both totalled in the distal portion.  His EF was calculated at 8%.  He  had a TAXUS stent to the RCA, reducing the stenosis from 95% to 0%.  An  intra-aortic balloon pump was inserted.  He was placed on Integrilin for 24  hours as well.  Dr. Emilie Rutter. Pulsipher reviewed the films and felt that a  viability study, once he recovered from the acute event, was indicated to  see if there was any viability in the anterior wall.  This was determined  the benefit of LAD revascularization.   The intra-aortic balloon pump was discontinued the next day.  He had some  hypotension but was successfully weaned off the balloon pump with a systolic  blood pressure between 90 and 100.  He tolerated this well.   Mr.  [name] initially had edema on his chest x-ray but this improved without  diuretics.  His respiratory status improved and by July 28, 2003, his O2  saturation was 97% on room air.  There were no crackles on exam.  He had a  lipid profile performed that showed a total cholesterol of 141,  triglycerides 92, HDL 25 and LDL 98.  He was started on Zocor at 20 mg a day  and is to follow up in the office.  Addition of Niaspan or Tricor will be  per M.D.   It was felt that he was stable for viability study on July 29, 2003.  Persistence of the myocardial defect  was seen within the anterior wall,  consistent with nonviable infarcted myocardium.  There was redistribution of  the thallium activity within the septal wall, which is maximum on the 24-  hour images, consistent with viable myocardium.  No EF was given but an  echocardiogram was performed on August 10, 2003 to assess recovery of his  EF, which initially had been 8%; it showed an EF of 20% with akinesis of the  distal half of the ventricle.  The left ventricle was markedly dilated but  the thickness was normal.  The left atrium was mildly dilated as well.  There were no critical  valvular abnormalities and there was a minimal  pericardial effusion but no hemodynamic compromise.  He had been started on  a low-dose ACE inhibitor and also was anticoagulated.  His Coumadin level  was increasing on 7.5 mg daily and he was on Lovenox as well.  Because his  EF remained low at 20%, an EP consult was called.   [name] was seen by Dr. Duke [name], who felt that he needed  assessment for an ICD.  The decision was made to insert one and this was  performed on August 10, 2003.  A Guidant Ventak Prizm generator was  inserted and the device was functioning well.  The next day, it was tested  and the thresholds were within normal limits with normal device function and  the VVI pacing function set at 45 beats per minute.  The shock impedance was  48 ohms.   On August 11, 2003, his INR was 2.2.  He was having some oozing from his  ICD site and even though pressure was held and then a pressure dressing was  in place for 6 hour, the oozing continued.  It was felt that this was  secondary to his anticoagulation, which was held.  He is to be held  overnight and if we are able to stop the bleeding and there is no __________  hematoma, he will be discharged on July 12, 2003.   As part of his cardiac risk factor reduction, a smoking cessation consult  was called.  The patient stated he will never smoke again and seems very  motivated.  There were no issues with that during his hospital stay.  Additionally, a nutrition consult was called.  He is to see the nutritionist  in the hospital and then follow up with a nutrition consult as an outpatient  for a low-sodium, heart-healthy diet, which he had not been eating prior to  admission.   LABORATORY VALUES:  Sodium 138, potassium 4.3, chloride 104, CO2 28, BUN 13,  creatinine 0.9, glucose 96.  Hemoglobin 12.9, hematocrit 38.2, WBC 5.3,  platelets 237,000.  INR on August 11, 2003 2.2.  TSH 1.394.   DISCHARGE DIAGNOSES: 1.  Out-of-hospital myocardial infarction, status post percutaneous     transluminal coronary angioplasty and TAXUS stent to the right coronary     artery, this admission, in the ACUITY trial.  2. 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  3. Hypotension.  4. Congestive heart failure.  5. Dyslipidemia.  6. Mild anemia secondary to procedures.  7. History of tobacco use.  8. Relook catheterization on August 03, 2003, unable to cross left anterior     descending with a balloon, procedure aborted with no evidence of     dissection.  9. Anticoagulation with Coumadin started this admission secondary to left     ventricular dysfunction.   DISCHARGE INSTRUCTIONS:  1. His activity level is to include no strenuous activity till cleared by     M.D.  2. He is to get an INR on Friday, August 14, 2003, at 11:15 and be seen by     Dr. Gerri Spore on August 21, 2003 at 3 p.m.  3. He is to follow up with Dr. Graciela Husbands on Nov 18, 2003 at 3:10 p.m.  4. He is to take Tylenol p.r.n. for pain.  5. He is to stick to a low-fat/-cholesterol/-salt diet per instructions.  6. He is to weigh himself daily.   DISCHARGE MEDICATIONS:  1. Coumadin 5 mg as directed.  2. Plavix 75 mg daily.  3. Zocor 40 mg daily.  4. Coreg 6.25 mg b.i.d.  5. Nitroglycerin p.r.n.  6. Aspirin 81 mg daily.  7. Aldactone 25 mg daily.  8. Altace 2.5 mg b.i.d.      Theodore Demark, P.A. [facility]                  [name], M.D.    RB/MEDQ  D:  08/11/2003  T:  08/12/2003  Job:  [id number]   cc:   Carole Binning, M.D. [facility]

## 2010-11-25 NOTE — Assessment & Plan Note (Signed)
[facility]                              CARDIOLOGY OFFICE NOTE   NAME:Brodt, NERO SAWATZKY                      MRN:          [id number]  DATE:03/01/2006                            DOB:          [birth year]    PRIMARY CARE PHYSICIAN:  Kari Baars, MD   PATIENT IDENTIFICATION:  Mr. Fredin is a 55 year old male with a history of  congestive heart failure, secondary ischemic cardiomyopathy, who presents  today for routine followup.   PAST MEDICAL HISTORY:  1. Congestive heart failure secondary to severe ischemic cardiomyopathy.      a.     Echocardiogram from August 2007 showed an EF of 20% with       akinesis of the distal half to two-thirds of the ventricle.  There was       trivial mitral regurgitation.      b.     Status post bi-V ICD.      c.     Cardiopulmonary exercise test August 2007 PCO2 of 22.1, which       was 71% of predicted.  VE/VCO2 slope is 26, which is normal.  Peak RER       is 1.27, consistent with excellent functional capacity.  2. Coronary artery disease, status post multiple myocardial infarctions.  3. History of tobacco use, quit in 2005.  4. Hyperlipidemia.  5. History of left bundle branch block.  6. Diabetes, new diagnosis.   CURRENT MEDICATIONS:  1. Plavix 75 mg a day.  2. Spironolactone 25 mg a day.  3. Lasix 40 mg p.r.n.  4. Aspirin 325 mg a day.  5. Vytorin 10/40 mg.  6. Altace 5 mg b.i.d.  7. Coreg 50 mg b.i.d.  8. Amaryl 2 mg a day.   ALLERGIES:  No known drug allergies.   INTERVAL HISTORY:  Mr. Busch returns today for routine followup.  Last  visit we changed his Lasix from daily to p.r.n.  His presyncope has  resolved.  He recently did a CPX test with excellent functional capacity.  He did have some calf pain or cramping on the treadmill.  However, he was  able to put forth a maximal effort.  He denies any orthopnea, PND, lower  extremity edema or chest pain.  He is no longer walking regularly.   PHYSICAL EXAMINATION:  GENERAL:  He is well appearing, in no acute distress.  VITAL SIGNS:  Blood pressure is 105/67, heart rate is 56, weight is 243,  which is up 4 pounds from previous.  HEENT:  Sclerae are anicteric, EOMI.  There is no xanthelasma.  Mucous  membranes are moist.  NECK:  Supple.  There is no JVD.  Carotids are 2+ bilaterally without  bruits.  There is no lymphadenopathy or thyromegaly.  CARDIAC:  He is bradycardic and regular, no S3, no murmur.  LUNGS:  Clear.  ABDOMEN:  Soft, nontender, nondistended.  No hepatosplenomegaly, no bruits,  no masses.  EXTREMITIES:  Warm with no cyanosis, clubbing or edema.  NEUROLOGIC:  Alert and oriented x3.  Cranial nerves II-XII are intact.  He  moves all 4 extremities without difficulty.   ASSESSMENT AND PLAN:  1. Congestive heart failure secondary to ischemic cardiomyopathy.  He is      currently NYHA class I.  CPX testing shows excellent functional      capacity with a good prognosis.  He is on an excellent medical regimen.      His fluid status looks good.  We will continue to use Lasix just on a      p.r.n. basis.  2. Calf pain.  This is somewhat suspicious for peripheral artery disease,      but he does not get it with routine activities.  I told him that he      needs to resume his walking program and be more active.  We will also,      at the next visit, consider arterial blood gases with lower extremity      ultrasound.  3. Hyperlipidemia.  Most recent LDL was at goal, at 39.  However, his HDL      remains low at 29.  He is not interested in Niaspan at this time.  4. Diabetes.  Followed by Dr. Clelia Croft.   Return to clinic in 3 months.  He will need a repeat cholesterol check and  also to watch his potassium on spironolactone.                                Bevelyn Buckles. Bensimhon, MD    DRB/MedQ  DD:  03/01/2006  DT:  03/01/2006  Job #:  [id number]   cc:   Kari Baars, MD

## 2010-11-25 NOTE — Assessment & Plan Note (Signed)
[facility]                            CARDIOLOGY OFFICE NOTE   NAME:[name], [name]                      MRN:          [id number]  DATE:08/01/2006                            DOB:          06-13-1956    PRIMARY CARE PHYSICIAN:  Dr. Eric Form.   PATIENT IDENTIFICATION:  Mr. Auzenne is a delightful, 55 year old male  with a history of congestive heart failure secondary to ischemic  cardiomyopathy with an EF of 20%. He is status post multiple myocardial  infarctions. Peak VO2 previously was 22.1. He has also recently been  diagnosed with diabetes.   Daquarius continues to do well. He does most anything he wants without any  significant limitation. He denies any chest pain, no shortness of  breath, no orthopnea or PND. Reportedly he did have some nonsustained VT  on his defibrillator but did not receive a shock. It sounds like he may  have had antitachycardia pacing however.   CURRENT MEDICATIONS:  1. Plavix 75 a day.  2. Spironolactone 25 a day.  3. Lasix 40 mg Monday, Wednesday, Friday.  4. Aspirin 325 a day.  5. Vytorin 10/40.  6. Altace 5 b.i.d.  7. Coreg 50 b.i.d.  8. Amaryl 2 mg a day.   PHYSICAL EXAMINATION:  GENERAL:  He is well-appearing in no acute  distress. He ambulates around the clinic any respiratory difficulty.  VITAL SIGNS:  Blood pressure is 98/62. His heart rate is 62, his weight  is 243.  HEENT:  Sclera anicteric. EOMI. No xanthelasmas. Mucous membranes are  moist, oropharynx is clear.  NECK:  Supple, no JVD. Carotids are 2+ bilaterally without any bruits.  There is no lymphadenopathy or thyromegaly.  CARDIAC:  He has got a regular rate and rhythm. No murmurs, rubs or  gallops. There is no S3.  LUNGS:  Clear.  ABDOMEN:  Soft, nontender, nondistended, mildly obese. No  hepatosplenomegaly, no bruits or masses are appreciated.  EXTREMITIES:  Warm with no cyanosis, clubbing or edema.  NEUROLOGIC:  He is alert and oriented x3.  Cranial nerves II-XII are  intact. He moves all 4 extremities without difficulty. Affect is normal.   EKG shows normal sinus rhythm at a rate of 62 with BiV paced rhythm.   1. Congestive heart failure secondary to ischemic cardiomyopathy. He      is doing well. I would put him a NYHA class 1. He is euvolemic. We      will continue with our current medical regimen.  2. Hyperlipidemia. He is due for lipids and will check them today.  3. Coronary artery disease. He is doing well without any signs of      ischemia. Continue current therapy. May consider a followup stress      test at some point versus repeat CPX testing.   DISPOSITION:  Will see him back in several months. He will follow up  with Dr. Clelia Croft regarding his diabetes.     Bevelyn Buckles. Bensimhon, MD  Electronically Signed    DRB/MedQ  DD: 08/01/2006  DT: 08/01/2006  Job #: [id number]   cc:  Kari Baars, M.D.

## 2010-11-25 NOTE — Assessment & Plan Note (Signed)
[facility]                         ELECTROPHYSIOLOGY OFFICE NOTE   NAME:[name], Scott LEAVY                      MRN:          [id number]  DATE:07/30/2006                            DOB:          [birth year]    [name] was seen today in the clinic on July 30, 2006, for  followup of his Guidant, Model Number H177 contact.  Date of implant was  June 15, 2004, for ischemic cardiomyopathy.   On interrogation of his device today, his battery voltage is 3.02 with a  charge time of 8.2 seconds.  P waves measured 5.5 mV with an atrial  capture threshold of 0.4 volts at 0.5 msec and an atrial lead impedance  of 464.  Right ventricular R waves measured greater than 25 mV with a  right ventricular pacing threshold of 0.6 volts at 0.5 msec and a right  ventricular lead impedance of 567 ohms.  Left ventricular R waves  measured 23.6 mV with a left ventricular pacing threshold of 0.6 volts  at 0.5 msec and a left ventricular lead impedance of 608 ohms.  Shock  impedance was 48.  There was one ventricular tachycardia episode treated  with ATP therapy successfully.  No other episodes since last  interrogation.  No changes were made in his parameters.  He will  continue with his lab 2 transmissions at 3, 6, and 9 months' time with  return office visit in 1 year.      Altha Harm, LPN  Electronically Signed      Duke Salvia, MD, [facility]  Electronically Signed   PO/MedQ  DD: 07/30/2006  DT: 07/30/2006  Job #: [id number]

## 2010-11-25 NOTE — Op Note (Signed)
NAMEKYAL, ARTS NO.:  [id number]   MEDICAL RECORD NO.:  [id number]          PATIENT TYPE:  INP   LOCATION:  2030                         FACILITY:  [facility]   PHYSICIAN:  Duke Salvia, M.D.  DATE OF BIRTH:  December 10, 1955   DATE OF PROCEDURE:  06/15/2004  DATE OF DISCHARGE:                                 OPERATIVE REPORT   PREOPERATIVE DIAGNOSIS:  Congestive heart failure, previously implanted  defibrillator for primary prevention in the context of severe ischemic  cardiomyopathy and left bundle branch block.   POSTOPERATIVE DIAGNOSIS:  Congestive heart failure, previously implanted  defibrillator for primary prevention in the context of severe ischemic  cardiomyopathy and left bundle branch block.   PROCEDURE:  Contrast venography, upgrade of his single chamber defibrillator  to a dual chamber defibrillator with resynchronization therapy, pocket  revision, and defibrillator lead assessment.   Following obtaining informed consent, the patient was brought to the  electrophysiology laboratory and placed on the fluoroscopic tabled in the  supine position.  After routine prep and drape of the left upper chest,  lidocaine was infiltrated over the line of the previous incision.  The  incision was made and carried down to the level of the device pocket using  electrocautery and sharp dissection.  The pocket was opened up and the  device was explanted.  The leads were freed up.  At that point, a contrast  venogram previously having demonstrated the patency of the extrathoracic  left subclavian vein, attention was turned to gaining access to the  extrathoracic left subclavian vein which was accomplished without difficulty  and without the aspiration of air or puncture of the artery.  Two separate  venipunctures were accomplished and over the more caudal wire, a 9.5 French  sheath was placed through which was then passed a Guidant extended hook  coronary sinus  cannulation catheter.  The coronary sinus was cannulated with  the wire with little difficulty.  However, it turned out to be quite  difficult to get the sheath to deploy itself into the coronary sinus.  We  tried double Wholey wires, the balloon which did not have enough length, a  Medtronic Select sheath which did not have, despite our thoughts, sufficient  freedom of movement within the Guidant sheath to pass, and ultimately as I  was getting ready to pass a Guidant right angle cannulation sheath, the  catheter popped into the coronary sinus into a perfect location.  At that  point, a venogram demonstrated a lateral coursing branch which we were then  able to choose the right angle catheter to place the [facility] wire and advance  a sheath.  The whisper wire was then placed through the right angle sheath  into the lateral branch and a Guidant 4543 EZ Trak II, serial number [id number]  lead was coursed into various branches on the lateral wall.  Ultimately, an  ideal branch was found that was in the mid position between base and apex  and mid position between anterior and posterior walls where the bipolar R  wave was 5 millivolts with a  pacing impedance of 1100 ohms and threshold of  0.8 volts at 0.5 milliseconds.  At this point, the 9.5 French sheath was  removed and over the second retained guide-wire, a 7 French sheath was  placed through which was then passed a Medtronic 5076, 52 cm active fixation  atrial lead, serial number [id number].  Under fluoroscopic guidance, it was  manipulated into the right atrial appendage where the bipolar P wave was 5.1  millivolts with a pacing impedance of 707 ohms and threshold of 0.6 volts at  0.5 milliseconds.  This lead was then secured to the prepectoral fascia and  under fluoroscopic guidance, the LV delivery sheath was removed.  The leads  were then attached to a Guidant Renewal III High Energy defibrillator, model  H177, serial number [id number].  Through  the device, the bipolar P wave was 5.1  millivolts with an impedance of 548 ohms and a threshold 0.6 volts at 0.5  milliseconds.  The RV amplitude was 21 millivolts with a pacing impedance of  500 ohms and threshold of 0.8 volts at 0.5 milliseconds.  The LV amplitude,  for goodness knows what reason, had increased to 25 millivolts with a pacing  impedance of 934 ohms, and the threshold was 0.2 volts at 0.5 milliseconds.  It should be noted that we had had to reposition the lead a couple of times  because of diaphragmatic stimulation and in its final location, there was no  diaphragmatic stimulation at 10 volts.  The high voltage impedance was 42  ohms.  At this point, the pocket was expanded to allow for insertion of the  larger defibrillator.  The leads and the pulse generator were then placed in  the pocket, the pocket having been copiously irrigated with antibiotic  containing saline solution and hemostasis having been assured.  Because of  the patient's previously elevated DFT, it was elected not to undertake DFT  testing again but to do a single high voltage shock to assess the integrity  of the defibrillator coils.  This having been done, the patient was sedated  and a 1.1 joule shock was delivered in a synchronized manner through an  impedance of 42 ohms.  At this point, the device was implanted.  The leads  and the pulse generator were secured to the prepectoral fascia.  The wound  was closed in three layers in the normal fashion.  The wound was washed,  dried, and a Benzoin and Steri-Strip dressing was applied.  Needle counts,  sponge counts, and instrument counts were correct at the end of the  procedure according to the staff.  The patient tolerated the procedure  without apparent complications.       SCK/MEDQ  D:  06/15/2004  T:  06/15/2004  Job:  [id number]

## 2010-11-25 NOTE — Cardiovascular Report (Signed)
[name], [name]                         ACCOUNT NO.:  [id number]   MEDICAL RECORD NO.:  [id number]                   PATIENT TYPE:  INP   LOCATION:  3731                                 FACILITY:  [facility]   PHYSICIAN:  Carole Binning, M.D. [facility]         DATE OF BIRTH:  May 04, 1956   DATE OF PROCEDURE:  07/24/2003  DATE OF DISCHARGE:                              CARDIAC CATHETERIZATION   PROCEDURES PERFORMED:  1. Right and left heart catheterization with,  2. Coronary angiography,  3. Left ventriculography; and,  4. Abdominal aortography.  5. Intra-aortic balloon pump placement.  6. Percutaneous coronary intervention with placement of a drug-eluting stent     in the proximal right coronary artery.   CARDIOLOGIST:  Carole Binning, M.D.   INDICATIONS:  [name] is a 55 year old male who presented to the  hospital yesterday with complaint of two weeks of recurrent chest pain and  progressive shortness of breath.  His most severe pain was approximately 12  days prior to admission.  He was evaluated in an urgent care center  yesterday at a friend's insistence.  At that time he was having chest pain  and was given one sublingual nitroglycerin with relief of his chest pain.  EKG showed a new left bundle branch block.  Cardiac markers were elevated  consistent with an out of hospital myocardial infarction.  After  stabilization the patient was referred for cardiac catheterization.  He was  enrolled in the ACUITY Study and randomized to therapy with a combination of  Angiomax and Integrilin.   CATHETERIZATION PROCEDURAL NOTE:  A 6 French sheath was placed in the right  femoral artery.  Coronary angiography was performed with 6 [place] JL-4 and  JR-4 catheters.  Left ventriculography and abdominal aortography were  performed with an angled pigtail catheter.  After completion of the  percutaneous coronary intervention we performed right heart catheterization  and left the  Swan-Ganz catheter in place for hemodynamic monitoring.  This  was performed via the left femoral vein with an 8 French sheath.   COMPLICATIONS:  There were no complications.   CATHETERIZATION RESULTS:   HEMODYNAMIC DATA:  (Right heart pressures were obtained after completion of  the percutaneous coronary intervention.)   Right atrial pressure is 13.  Right ventricular pressure 40/12.  Pulmonary  artery pressure 40/28.  Pulmonary capillary wedge mean pressure 24.  Left  ventricular pressure 100/38.  Aortic pressure 100/72.  There was no aortic  valve gradient.   VENTRICULOGRAPHIC DATA:  Left Ventriculogram:  There is severe global  akinesis of the left ventricle involving the anteroapical and the entire  length of the inferior wall.  Only the base of the anterior wall is  contracting.  Ejection fraction was calculated at 8%.  No significant mitral  regurgitation was appreciated.   AORTOGRAPHIC DATA:  Abdominal aortogram reveals normal abdominal aorta,  renal arteries and iliac arteries.   ARTERIOGRAPHIC DATA:  Coronary Arteriography (Right Dominant)  Left Main:  The left main is normal.   Left Anterior Descending Artery:  The left anterior descending artery has a  tubular 60% stenosis in the midvessel followed by 100% occlusion of the  midvessel.  This appears to be a probable recent occlusion.  There are faint  Grade 1 collaterals filling the distal LAD.  The LAD gives rise to a normal  size first diagonal branch proximal to its occlusion.  There is a 30%  stenosis in the proximal portion of this diagonal branch.   Left Circumflex:  The left circumflex gives rise to a small first obtuse  marginal, a large second obtuse marginal and a normal-size third obtuse  marginal branch.  There is a 40% stenosis in the second obtuse marginal  branch.   Right Coronary Artery:  The right coronary artery is a relatively large  dominant vessel.  There is a discrete 20% stenosis in the  proximal vessel  followed by a 95% stenosis with haziness, probable thrombus and the  appearance of an ulcerated plaque in the proximal vessel.  The distal right  coronary artery gives rise to a normal-size posterior descending artery and  a normal-size posterolateral branch.  The very distal portion of the  posterior descending artery and posterolateral branch appears to be occluded  most likely by thromboembolism.   IMPRESSION:  1. Elevated right and left heart filling pressures with mild-to-moderate     pulmonary hypertension.  2. Severe left ventricular systolic dysfunction secondary to probable recent     out of hospital wall myocardial infarction involving both the anterior as     well as the inferior walls.  3. Two-vessel coronary artery disease as described.  The left anterior     descending occlusion appears that it may be recent.  There is also a very     critical stenosis in the proximal right coronary artery.   PLAN:  These findings were reviewed with Dr. Samule Ohm.  The feeling is, at  this point, given the patient's severely depressed left ventricular systolic  function he would not be a candidate for coronary artery bypass surgery.  The right coronary artery appears to have very unstable plaque and concern  is the possibility of an abrupt closure if this is not treated.  After  reviewing the options we have opted to proceed with percutaneous coronary  intervention to the right coronary artery with intra-aortic balloon pump  support.  Following this we would recommend a viability study to determine  whether there is viability of the anterior wall and thus whether the patient  would benefit from revascularization of the left anterior descending artery.   PERCUTANEOUS TRANSLUMINAL CORONARY ANGIOPLASTY PROCEDURAL NOTE:  Following  completion of the diagnostic catheterization we proceeded percutaneous coronary intervention.  An 8 French 40 mL intra-aortic balloon pump was   placed via the left femoral artery for hemodynamic support.  The patient was  continued on Angiomax and Integrilin with an additional Angiomax bolus given  per the ACUITY Trial protocol.  We used a 6 [place] JR-4 guiding catheter.  An IQ coronary guidewire was advanced under fluoroscopic guidance into the  distal right coronary artery.  PTCA of the proximal right coronary artery  was then performed with a 3.5 x 15 mm Quantum balloon.  Two inflations to 12  atmospheres each were performed.  We then positioned a 3.5 x 16 mm TAXUS  drug-eluting stent across the diseased segment of the proximal right  coronary artery and deployed this stent at 17 atmospheres.  This was for a  final deployment diameter of approximately 3.9 mm.   Final angiographic images were obtained revealing patency of the right  coronary artery with 0% residual stenosis at the stent site and TIMI III  flow.   COMPLICATIONS:  None.   RESULTS:  Successful placement of a drug-eluting stent in the proximal right  coronary artery; a 95% complex ulcerated plaque was reduced to 0% residual  with TIMI III flow.   PLAN:  1. The patient will be continued on Integrilin for 24 hours.  2. We will remove his right arterial femoral sheath and then resume Angiomax     while the balloon pump is in place.  3. We will also leave the Swan-Ganz catheter in place for hemodynamic     monitoring.  4. After the balloon pump and the Swan-Ganz catheter are removed would     consider institution of Lovenox therapy to prevent the development of     left ventricular apical thrombus.  5. Would also recommend obtaining a viability study over the next several     days to determine whether there would be benefit from revascularization     of the left anterior descending artery.  6. The patient will be started on Plavix, which should be continued for a     minimum of nine months.                                               Carole Binning, M.D.  [facility]    MWP/MEDQ  D:  07/24/2003  T:  07/25/2003  Job:  [id number]   cc:   Willa Rough, M.D.   Cardiac Catheterization Laboratory

## 2010-11-25 NOTE — Assessment & Plan Note (Signed)
HEALTHCARE                            CARDIOLOGY OFFICE NOTE   NAME:[name], [name]                      MRN:          [id number]  DATE:10/22/2006                            DOB:          [birth year]    PRIMARY CARE PHYSICIAN:  [name], M.D.   INTERVAL HISTORY:  [name] is a very pleasant 55 year old male with  a history of congestive heart failure secondary to ischemic  cardiomyopathy with an EF of 20%.  He is status post multiple myocardial  infarctions with several previous stents.  He has a Bi-V ICD in place.  Peak VO2 previously was 22.1.  Remainder of medical history includes  history of tobacco use, now quit, also, diabetes and obesity.   [name] continues to do well.  He denies any chest pain or shortness of  breath.  He remains fairly active without any significant dyspnea on  exertion.  He has not had orthopnea or PND, no ICD shocks.   CURRENT MEDICATIONS:  1. Plavix 75 mg daily.  2. Spironolactone 25 mg daily.  3. Lasix 40 mg Monday, Wednesday, Friday.  4. Aspirin 325 mg daily.  5. Vytorin 10/40.  6. Altace 5 mg b.i.d.  7. Coreg 50 mg b.i.d.  8. Amaryl 2 mg daily.   PHYSICAL EXAMINATION:  GENERAL:  He is well-appearing, no acute  distress, ambulates around the clinic without respiratory difficulty.  VITAL SIGNS:  Blood pressure 102/60, heart rate 60, weight 241.  HEENT:  Sclerae anicteric.  EOMI.  No xanthelasma.  Mucous membranes  moist.  Oropharynx clear.  NECK:  Supple.  No JVD.  Carotids are 2+ bilaterally without any bruits.  No lymphadenopathy or thyromegaly.  CARDIAC:  Regular rate and rhythm.  No murmurs, rubs or gallops.  LUNGS:  Clear.  ABDOMEN:  Obese, nontender, nondistended, no hepatosplenomegaly.  No  bruits or masses.  Good bowel sounds.  EXTREMITIES:  Warm with no cyanosis, clubbing or edema.  NEUROLOGICAL:  He is alert and oriented x3.  Cranial nerves 2-12 are  intact.  Moves all four extremities  without difficulty.  Affect is  pleasant.   ASSESSMENT/PLAN:  1. Congestive heart failure secondary to ischemic cardiomyopathy.  He      remains well-compensated.  NYHA class II.  Continue current      medications.  2. Coronary artery disease.  This is stable.  He is on a good medical      regimen.  Will continue this.  3. Hyperlipidemia.  Cholesterol is well controlled.  LDL is quite low      at 32.  We did discuss possibly stopping the Zetia and just leaving      him on Simvastatin, but he feels that he is doing well on this and      would like to continue.  Will check a lipid panel today.  4. Diabetes.  Followed by Dr. Clelia [name].  Will check a hemoglobin A1C with      his labs today and for this on to Dr. Clelia [name].   DISPOSITION:  Return to clinic in three month for  routine follow up.     Scott Buckles. Bensimhon, MD  Electronically Signed    DRB/MedQ  DD: 10/22/2006  DT: 10/22/2006  Job #: [id number]   cc:   [name], M.D.

## 2010-11-25 NOTE — Discharge Summary (Signed)
[name]               ACCOUNT NO.:  [id number]   MEDICAL RECORD NO.:  [id number]          PATIENT TYPE:  INP   LOCATION:  2030                         FACILITY:  [facility]   PHYSICIAN:  [name], M.D.  DATE OF BIRTH:  Sep 18, 1955   DATE OF ADMISSION:  06/15/2004  DATE OF DISCHARGE:  06/16/2004                                 DISCHARGE SUMMARY   DISCHARGE DIAGNOSES:  1.  Postoperative day #1 status post implantation of Guidant CONTAK RENEWAL      3 HE, Model H177 with biventricular upgrade June 15, 2004, Dr. Sherryl [name].  2.  Severe ischemic cardiomyopathy, ejection fraction is less than 25% by      echocardiogram on September of 2005.  He has 2+ moderate mitral      regurgitation.  3.  History of myocardial infarction x2 in 2001 and 2005.  He has both      anterior and inferior myocardial infarction territories.  4.  Status post percutaneous coronary intervention--stenting of the right      coronary artery January 2005 in the setting of cardiogenic shock with      ejection fraction 8%.  5.  Status post implantation of Guidant VENTAK PRIZM implantable      cardioverter defibrillator August 10, 2003.  6.  Thallium study shows no viability of anterior wall in the left anterior      descending territory at catheterization unable to cross the left      anterior descending lesion.  7.  Class III congestive heart failure.  8.  Now presenting with persistent wide QRS, left bundle branch block.   SECONDARY DIAGNOSES:  1.  Dyslipidemia.  2.  Ongoing tobacco habituation.   PROCEDURES:  June 15, 2004 a biventricular upgrade with placement of  Guidant CONTAK RENEWAL 3 HE. Model H177, Dr. Sherryl [name].   DISCHARGE DISPOSITION:  [name] ready for discharge postprocedure day  #1.  His incision looks nicely.  His x-ray has been checked and shows that  the leads are in an appropriate position.  There is no pneumothorax.  Mobility of the left arm has been  explained to [employer].  His pain is  controlling now with Tylenol only.  He is asked to keep his incision dry for  the next week.  He may begin showering Wednesday, December 14th.  He is to  call [phone] if he experiences any evidence of infection such as fevers,  chills, sweats, redness at the incision site, swelling or increased pain.  The patient goes home with the following medications:   DISCHARGE MEDICATIONS:  1.  Enteric-coated aspirin 325 mg daily.  2.  Plavix 75 mg daily.  3.  Zocor 40 mg daily at bedtime.  4.  Altace 2.5 mg twice daily.  5.  Spironolactone 25 mg daily.  6.  Coreg 12.5 mg twice daily.  7.  Lasix 40 mg twice daily.  8.  Nitroglycerin 0.4 mg 1 tablet under the tongue every 5 minutes x3 doses      as needed for chest pain.  9.  He had been previously on Coumadin and this is going to be held until he      talks to Dr. Gala [name].   DISCHARGE DIET:  Low sodium, low cholesterol with fluid intake cautions.   He has follow up at the ICD clinic [facility], [address] Tuesday, June 28, 2004, at 9:30 in the morning.  He sees Dr.  Gerri [name] Friday, July 15, 2004, at 9:15 in the morning.  He sees Dr.  Gala [name] Wednesday, July 20, 2004, at 9:15 in the morning.   BRIEF HISTORY:  Mr [name] is a 55 year old male who experienced severe  anterior wall myocardial infarction in January of 2005. By catheterization,  2 lesions were found and the right coronary artery lesion was reduced by  stenting from 80% to 20%; however, they were unable to cross the LAD lesion.  A subsequent thallium study showed no viability in the anterior wall.  He  has severe ischemic cardiomyopathy with ejection fraction less than 25% by  echocardiogram September 2005 and he has 2+ moderate mitral regurgitation.  The patient exhibits class III congestive heart failure symptoms and has not  done particularly all that well on maximal medical therapy throughout the   remainder of 2005.  Of note, he did have an ICD placed with appropriate  indication August 10, 2003.  The patient heretofore had intermittent left  bundle branch block now presents with wide QRS with persistent left bundle  branch block and is a candidate for cardiac resynchronization therapy and  presents December 7th for elective Bi-V upgrade to his Guidant ICD.   HOSPITAL COURSE:  The patient presented electively June 15, 2004.  He has  class III congestive heart failure ejection fraction 15%, prior myocardial  infarction x2.  He underwent implantation of biventricular upgrade with a  generator change June 15, 2004 by Dr. Graciela [name].  He has had no  postprocedure complications and goes home with the medications and the  follow up as dictated.  He will not be continuing his Coumadin postprocedure  until he talks with Dr. Gala [name].       GM/MEDQ  D:  06/16/2004  T:  06/16/2004  Job:  [id number]   cc:   [name], M.D. [facility]   [name], M.D. [facility]

## 2010-11-25 NOTE — Op Note (Signed)
[name], [name]                         ACCOUNT NO.:  [id number]   MEDICAL RECORD NO.:  [id number]                   PATIENT TYPE:  INP   LOCATION:  2010                                 FACILITY:  [facility]   PHYSICIAN:  Duke Salvia, M.D.               DATE OF BIRTH:  1956/05/06   DATE OF PROCEDURE:  08/10/2003  DATE OF DISCHARGE:                                 OPERATIVE REPORT   PREOPERATIVE DIAGNOSIS:  Severe ischemic cardiomyopathy with ejection  fraction of less than 10-15% with prior remote myocardial infarction.   POSTOPERATIVE DIAGNOSIS:  Severe ischemic cardiomyopathy with ejection  fraction of less than 10-15% with prior remote myocardial infarction.   PROCEDURE:  1. Single-chamber defibrillator implantation with intraoperative     defibrillation and threshold testing.  2. Contrast venography.  3. Fluoroscopy.  4. System revision.   DESCRIPTION OF PROCEDURE:  Following attainment of informed consent, the  patient was brought to the electrophysiology laboratory and placed on the  fluoroscopy table in supine position.  After routine prep and drape,  lidocaine was infiltrated in the prepectoral/subclavicular region.  An  incision was made and carried down to a layer of the prepectoral fascia  using electrocautery.  A pocket was formed similarly; hemostasis was  obtained.   Thereafter, attention was turned to gaining access to the extrathoracic left  subclavian vein.  We could facilitate via contrast venography the  ipsilateral vein.  This having been accomplished, a single guidewire was  placed and retained and a 0 silk suture was placed in a figure-of-eight  fashion and allowed to hang loosely.  A 9-0 Vicryl tearaway introducer  sheath was placed, through which was passed a Guidant 0.158 active-fixation  dual-coiled defibrillator lead, serial [id number].  Under fluoroscopic  guidance, it was manipulated into the right ventricular apex.  It was noted  that the heart  was rotated significantly anteriorly, complicating lead  deployment.  Ultimately, in the RV apex, the bipolar R wave was 8.2 mV with  a pacing impedance of 719 ohms and a pacing threshold of 0.4 volts at 0.5  msec with a currented threshold of 0.6 mA.  There was no diaphragmatic  pacing at 10 volts.   This lead was then secured to the prepectoral fascia and attached to a  Guidant Prism HE high-energy defibrillator, model [id number], serial [id number].  Through the device, the bipolar R wave was 19 mV with a pacing impedance of  630 ohms and a pacing threshold of 0.2 volts at 0.5 msec with a high-voltage  impedance of 51 ohms.   With these acceptable parameters recorded, defibrillation threshold testing  was undertaken.  Ventricular fibrillation was induced via the T wave shock.  After a total duration of 15 seconds, a 31-joule shock was delivered through  a measured resistance of 44 ohms, failing to terminate ventricular  fibrillation.  An external shock at 360 delivered in a biphasic  wave form  terminated ventricular fibrillation and restored a sinus rhythm.   THE POLARITY WAS REVERSED.  After a wait of 5 to 6 minutes, ventricular  fibrillation was re-induced via the T wave shock.  After a total duration of  14.5 seconds, a 31-joule shock was delivered through a measured resistance  of 44 ohms, terminating ventricular fibrillation and restoring sinus rhythm.   At this point, as we got ready to undertake the second reversed polarity  shock, further sedation was required (the patient required an enormous  amount of Versed and fentanyl for sedation).  The patient's blood pressure  dipped into the 90s, where it persisted.  Because of my concern about the  patient's fragility, given his ejection fraction in the 10% range, I elected  not to undertake other DFT testing.  The pocket was copiously irrigated with  antibiotic-containing saline solution, hemostasis was assured and the lead  and the pulse  generator were then placed in the pocket and secured to the  prepectoral fascia.  The wound was then closed in three layers in the normal  fashion.  The wound was washed and dried and a Benzoin and Steri-Strip  dressing was applied.  Needle counts, sponge counts and instrument counts  were correct at the end of the procedure according to the staff.  The  patient tolerated the procedure without apparent complication.                                               Duke Salvia, M.D.    SCK/MEDQ  D:  08/10/2003  T:  08/11/2003  Job:  [id number]   cc:   [facility]   [facility]

## 2010-11-25 NOTE — Assessment & Plan Note (Signed)
[facility]                              CARDIOLOGY OFFICE NOTE   NAME:[name]                        MRN:          [id number]  DATE:02/09/2006                            DOB:          August 30, 1955    PATIENT IDENTIFICATION:  [name] is a delightful 55 year old male with a  history of congestive heart failure secondary to ischemic cardiomyopathy who  presents today for routine followup.   PAST MEDICAL HISTORY:  1.  Congestive heart failure, secondary to severe ischemic cardiomyopathy      with an ejection fraction of 20%-25% by echocardiogram in April 2006,      with trace mitral regurgitation.      1.  BI-V ICD placed on June 15, 2004.  2.  Coronary artery disease, status post multiple myocardial infarctions.  3.  History of left bundle branch block.  4.  History of tobacco use, quit in 2005.  5.  Hyperlipidemia.   CURRENT MEDICATIONS:  1.  Plavix 75 mg daily.  2.  Spironolactone 25 mg daily.  3.  Lasix 40 mg daily.  4.  Aspirin 325 mg daily.  5.  Vytorin 10/40 mg daily.  6.  Altace 5 mg b.i.d.  7.  Coreg 50 mg b.i.d.   ALLERGIES:  No known drug allergies.   INTERVAL HISTORY:  [name] returns today for a routine followup.  We have not  seen him in awhile.  He says he can do most of his activities of daily  living without any problems.  In fact he was walking about 2-1/2 miles a day  every day for many months, but had to stop due to shin splints and had not  done this for the past six months.  He feels like it really did not help him  much overall.  Currently when he tries to do anything of moderate exertion  such as weed whacking or other related work, he gets tired very quickly.  He  denies any chest pain.  No real dyspnea.  He has had some upper back pain  which was his previous angina at one time, but this he does not feel has  been significant.  His main complaint that is every morning when he gets up,  he feels  pre-syncopal and has multiple episodes every morning.Marland Kitchen  He has not  had frank syncope.  His weight has been stable.  he has been checking it  religiously.  He has not been smoking.   PHYSICAL EXAMINATION:  GENERAL:  He is well-appearing, in no acute distress.  He ambulates around the clinic without difficulty.  VITAL SIGNS:  Blood pressure 118/72, heart rate 63, weight 239 pounds, which  is down 11 pounds from April.  HEENT:  Sclerae anicteric.  EOMI.  There is no xanthelasma.  Mucous  membranes are moist.  NECK:  Supple, no jugular venous distention.  Carotids are 2+ bilaterally  without bruits.  There is no lymphadenopathy.  No thyromegaly.  HEART:  A regular rate and rhythm.  No murmurs, rubs or gallops.  No S3.  LUNGS:  Clear.  ABDOMEN:  Nontender, non-distended.  No hepatosplenomegaly, no bruits, no  masses.  Good bowel sounds.  EXTREMITIES:  Warm with no clubbing, cyanosis or edema.   LABORATORY DATA:  Sodium 142, potassium 4.3, BUN 18, creatinine 1.1, glucose  145.  Liver function tests normal.  Total cholesterol 91, triglycerides 118,  HDL 29, LDL 39.   ASSESSMENT/PLAN:  1.  Congestive heart failure, secondary to severe ischemic cardiomyopathy.      He has New York Heart Association Class III symptoms.  These seem to be      fairly stable; however, he is having a significant amount of pre-      syncope.  I suspect that this is related to his medications.  I have      asked him to stop his Lasix and only use it on a p.r.n. basis.  If he      continues to be pre-syncopal, then we will need to cut back his Coreg to      25 mg b.i.d. and I instructed him on this.  Will follow him up with an      echocardiogram as well as a CTX test, to evaluate his functional      capacity.  I do not suspect that he is at a transplant level at this      point.  2.  Diabetes:  This is new onset.  I have started him on Amaryl 2 mg daily      and he will follow up with Dr. Eric Form.  3.   Hyperlipidemia:  The HDL remains mildly low.  the LDL is well-      controlled.  Continue Vytorin.  May consider Niaspan in the future.                                Bevelyn Buckles. Bensimhon, MD    DRB/MedQ  DD:  02/09/2006  DT:  02/09/2006  Job #:  [id number]   cc:   Kari Baars, MD

## 2011-01-12 ENCOUNTER — Telehealth: Payer: Self-pay | Admitting: Internal Medicine

## 2011-01-12 NOTE — Telephone Encounter (Addendum)
Spoke with Pt 01/09/11 he was asking for copy of all Appts,Address of all Doctors,called pt for Pick-Up 01/12/11.Marland Kitchen He will sign ROI upon pickup. 01/12/11/km   PT Picked up Copy Of Records Signed ROI, also signed ROI to send Records to [employer] if needed 01/12/11/km

## 2011-02-10 ENCOUNTER — Ambulatory Visit (INDEPENDENT_AMBULATORY_CARE_PROVIDER_SITE_OTHER): Payer: PRIVATE HEALTH INSURANCE | Admitting: Internal Medicine

## 2011-02-10 ENCOUNTER — Encounter: Payer: Self-pay | Admitting: Internal Medicine

## 2011-02-10 DIAGNOSIS — I5022 Chronic systolic (congestive) heart failure: Secondary | ICD-10-CM

## 2011-02-10 DIAGNOSIS — Z95810 Presence of automatic (implantable) cardiac defibrillator: Secondary | ICD-10-CM | POA: Insufficient documentation

## 2011-02-10 DIAGNOSIS — I428 Other cardiomyopathies: Secondary | ICD-10-CM

## 2011-02-10 DIAGNOSIS — F4310 Post-traumatic stress disorder, unspecified: Secondary | ICD-10-CM

## 2011-02-10 DIAGNOSIS — I2510 Atherosclerotic heart disease of native coronary artery without angina pectoris: Secondary | ICD-10-CM

## 2011-02-10 NOTE — Progress Notes (Signed)
Mr. Hardge  Seen in followup for congestive heart failure in the setting ischemic cardiomyopathy. with history of congestive heart failure secondary to ischemic cardiomyopathy with an EF of 20%.  He is status post CRT-D implantation and underwent device generator replacement July 2011. He is status post multiple myocardial infarctions and previous stents.  He has a biventricular ICD in place. He under derwent device generator replacement in July 2011.  Myoview 2011 which showed EF 17% with markedly dilated LV  EDV: 509 ml ESV: 422 ml. Extensive scar with trivial peri-infarct ischemia.  CPX  12/11 pVO2 24.5 slope 31.7 RER 1.12 O2 pulse 105%   Continue to have problems with exertional dyspnea and fatigue. Occasional CP.   He also has extreme anxiety related to inappropriate ICD discharges. ROS: All systems negative except as listed in HPI, PMH and Problem List.  Past Medical History  Diagnosis Date  . CHF (congestive heart failure)     secondary to severe ischemic cardiomyopathy.   a.    EF 20% with akinesis of the distal half to two-thirds of the ventricle.  Trivial MR   b.     Status post bi-V ICD.   c.     Cardiopulmonary exercise test (12/11) pVO2 of 24.5,VE/VCO2 slope is 31,.7  . CAD (coronary artery disease)     multiple MIs  . Hyperlipidemia   . Left bundle branch block   . Diabetes mellitus   . ICD (implantable cardiac defibrillator) in place 10/2009    shocks due to sinus tac    Current Outpatient Prescriptions  Medication Sig Dispense Refill  . aspirin 81 MG tablet Take 81 mg by mouth daily.        Marland Kitchen atorvastatin (LIPITOR) 40 MG tablet Take 40 mg by mouth daily.        . bisoprolol (ZEBETA) 10 MG tablet Take one tablet by mouth daily  30 tablet  6  . glimepiride (AMARYL) 4 MG tablet Take 4 mg by mouth daily.        . ramipril (ALTACE) 5 MG capsule Take 5 mg by mouth daily.       Marland Kitchen spironolactone (ALDACTONE) 25 MG tablet Take 25 mg by mouth daily.        Marland Kitchen zolpidem (AMBIEN CR)  12.5 MG CR tablet Take 12.5 mg by mouth as needed.           PHYSICAL EXAM: Filed Vitals:   02/10/11 0942  BP: 126/75  Pulse: 61  General:  The patient was alert and oriented in no acute distress. HEENT Normal.  Neck veins were flat, carotids were brisk.  Lungs were clear.  Heart sounds were regular without murmurs or gallops.  Abdomen was soft with active bowel sounds. There is no clubbing cyanosis or edema. Skin Warm and dry Neuro: affect normal  cranial nerves intact. Affect is anxious    ECG: NSR 61 with v-pacing    ASSESSMENT & PLAN:

## 2011-02-10 NOTE — Assessment & Plan Note (Signed)
We have had a long discussion regarding the impact of his inappropriate shocks. We have reprogrammed his ICD to both inactivate sustained rate duration and prolonged infections. I also strongly encouraged him to followup with Dr. Dellia Cloud for counseling in this regard. He is at this point reluctant. He will let us know.

## 2011-02-10 NOTE — Assessment & Plan Note (Signed)
The patient's device was interrogated.  The information was reviewed. Detections were changed as noted above

## 2011-02-10 NOTE — Assessment & Plan Note (Signed)
Continue current medications. 

## 2011-02-10 NOTE — Assessment & Plan Note (Signed)
Stable class III symptoms

## 2011-02-27 ENCOUNTER — Encounter: Payer: Self-pay | Admitting: Internal Medicine

## 2011-02-27 ENCOUNTER — Ambulatory Visit (INDEPENDENT_AMBULATORY_CARE_PROVIDER_SITE_OTHER): Payer: PRIVATE HEALTH INSURANCE | Admitting: Internal Medicine

## 2011-02-27 VITALS — BP 128/72 | HR 56 | Ht 76.0 in | Wt 242.0 lb

## 2011-02-27 DIAGNOSIS — I5022 Chronic systolic (congestive) heart failure: Secondary | ICD-10-CM

## 2011-02-27 DIAGNOSIS — I2510 Atherosclerotic heart disease of native coronary artery without angina pectoris: Secondary | ICD-10-CM

## 2011-02-27 NOTE — Progress Notes (Signed)
   Patient ID: [name], male    DOB: April 04, 1956, 55 y.o.   MRN: [id number]  HPI    Review of Systems    Physical Exam

## 2011-02-27 NOTE — Progress Notes (Deleted)
HPI:  [name] is a 55 year old male with history of congestive heart failure secondary to ischemic cardiomyopathy with an EF of 20%.  He is status post multiple myocardial infarctions and previous stents.  He has a biventricular ICD in place. He under derwent device generator replacement in July 2011.   Remainder of medical history includes diabetes, obesity, hypertension, and a low HDL.   Myoview beforehand which showed EF 17% with markedly dilated LV  EDV: 509 ml ESV: 422 ml. Extensive scar with trivial peri-infarct ischemia.  CPX  12/11 pVO2 24.5 slope 31.7 RER 1.12 O2 pulse 105%   Continue to have problems with exertional dyspnea and fatigue. Occasional CP. Can walk through grocery store without stopping but does get winded. No orthopnea, PND, edema.   ROS: All systems negative except as listed in HPI, PMH and Problem List.  Past Medical History  Diagnosis Date  . CHF (congestive heart failure)     secondary to severe ischemic cardiomyopathy.   a.    EF 20% with akinesis of the distal half to two-thirds of the ventricle.  Trivial MR   b.     Status post bi-V ICD.   c.     Cardiopulmonary exercise test (12/11) pVO2 of 24.5,VE/VCO2 slope is 31,.7  . CAD (coronary artery disease)     multiple MIs  . Hyperlipidemia   . Left bundle branch block   . Diabetes mellitus   . ICD (implantable cardiac defibrillator) in place 10/2009    shocks due to sinus tac    Current Outpatient Prescriptions  Medication Sig Dispense Refill  . aspirin 81 MG tablet Take 81 mg by mouth daily.        Marland Kitchen atorvastatin (LIPITOR) 40 MG tablet Take 40 mg by mouth daily.        . bisoprolol (ZEBETA) 10 MG tablet Take one tablet by mouth daily  30 tablet  6  . glimepiride (AMARYL) 4 MG tablet Take 4 mg by mouth daily.        . ramipril (ALTACE) 5 MG capsule Take 5 mg by mouth daily.       Marland Kitchen spironolactone (ALDACTONE) 25 MG tablet Take 25 mg by mouth daily.        Marland Kitchen zolpidem (AMBIEN CR) 12.5 MG CR tablet Take 12.5 mg by  mouth as needed.           PHYSICAL EXAM: Filed Vitals:   02/27/11 1001  BP: 128/72  General:  The patient was alert and oriented in no acute distress. HEENT Normal.  Neck veins were flat, carotids were brisk.  Lungs were clear.  Heart sounds were regular without murmurs or gallops.  Abdomen was soft with active bowel sounds. There is no clubbing cyanosis or edema. Skin Warm and dry Neuro: affect normal  cranial nerves intact.    ECG: NSR 61 with v-pacing    ASSESSMENT & PLAN:

## 2011-02-27 NOTE — Assessment & Plan Note (Signed)
No evidence of ischemia. Continue current regimen.   

## 2011-02-27 NOTE — Progress Notes (Signed)
HPI:  [name] is a 55 year old male with history of congestive heart failure secondary to ischemic cardiomyopathy with an EF of 20%.  He is status post multiple myocardial infarctions and previous stents.  He has a biventricular ICD in place. He underderwent device generator replacement in July 2011.   Remainder of medical history includes diabetes, obesity, hypertension, and a low HDL.   Myoview 7/11 which showed EF 17% with markedly dilated LV  EDV: 509 ml ESV: 422 ml. Extensive scar with trivial peri-infarct ischemia.  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 He had extensive discussion with Dr. Graciela Husbands about this recently.   From a HF standpoint continues with chronic exertional dyspnea and fatigue. Remain very nervous about future ICD shocks. Not requiring lasix.  Applied for disability and got turned down.   ROS: All systems negative except as listed in HPI, PMH and Problem List.  Past Medical History  Diagnosis Date  . CHF (congestive heart failure)     secondary to severe ischemic cardiomyopathy.   a.    EF 20% with akinesis of the distal half to two-thirds of the ventricle.  Trivial MR   b.     Status post bi-V ICD.   c.     Cardiopulmonary exercise test (12/11) pVO2 of 24.5,VE/VCO2 slope is 31,.7  . CAD (coronary artery disease)     multiple MIs  . Hyperlipidemia   . Left bundle branch block   . Diabetes mellitus   . ICD (implantable cardiac defibrillator) in place 10/2009    shocks due to sinus tac    Current Outpatient Prescriptions  Medication Sig Dispense Refill  . aspirin 81 MG tablet Take 81 mg by mouth daily.        Marland Kitchen atorvastatin (LIPITOR) 40 MG tablet Take 40 mg by mouth daily.        . bisoprolol (ZEBETA) 10 MG tablet Take one tablet by mouth daily  30 tablet  6  . glimepiride (AMARYL) 4 MG tablet Take 4 mg by mouth daily.        . ramipril (ALTACE) 5 MG capsule Take 5 mg by  mouth daily.       Marland Kitchen spironolactone (ALDACTONE) 25 MG tablet Take 25 mg by mouth daily.        Marland Kitchen zolpidem (AMBIEN CR) 12.5 MG CR tablet Take 12.5 mg by mouth as needed.           PHYSICAL EXAM: Filed Vitals:   02/27/11 1001  BP: 128/72  Pulse: 56  General:  The patient was alert and oriented in no acute distress. HEENT Normal.  Neck veins were flat, carotids were brisk.  Lungs were clear.  Heart sounds were regular without murmurs or gallops.  Abdomen was soft with active bowel sounds. There is no clubbing cyanosis or edema. Skin Warm and dry Neuro: affect normal  cranial nerves intact.    ECG: NSR 56 with v-pacing    ASSESSMENT & PLAN:

## 2011-02-27 NOTE — Assessment & Plan Note (Addendum)
Symptomatically NYHA II-III (CPX favors II). Volume status looks good. Continue current regimen. Long talk about role of ICD and risk of inappropriate shocks.

## 2011-03-06 ENCOUNTER — Encounter: Payer: Self-pay | Admitting: Internal Medicine

## 2011-05-11 ENCOUNTER — Encounter: Payer: PRIVATE HEALTH INSURANCE | Admitting: *Deleted

## 2011-05-16 ENCOUNTER — Encounter: Payer: Self-pay | Admitting: *Deleted

## 2011-05-19 ENCOUNTER — Encounter: Payer: Self-pay | Admitting: Internal Medicine

## 2011-05-19 ENCOUNTER — Other Ambulatory Visit: Payer: Self-pay | Admitting: Internal Medicine

## 2011-05-22 ENCOUNTER — Ambulatory Visit (INDEPENDENT_AMBULATORY_CARE_PROVIDER_SITE_OTHER): Payer: PRIVATE HEALTH INSURANCE | Admitting: *Deleted

## 2011-05-22 DIAGNOSIS — Z95810 Presence of automatic (implantable) cardiac defibrillator: Secondary | ICD-10-CM

## 2011-05-22 DIAGNOSIS — I5022 Chronic systolic (congestive) heart failure: Secondary | ICD-10-CM

## 2011-05-22 DIAGNOSIS — I428 Other cardiomyopathies: Secondary | ICD-10-CM

## 2011-05-26 LAB — REMOTE ICD DEVICE
AL AMPLITUDE: 6.2 mv
AL IMPEDENCE ICD: 502 Ohm
ATRIAL PACING ICD: 0 pct
BRDY-0002RA: 45 {beats}/min
BRDY-0003RA: 130 {beats}/min
CHARGE TIME: 8.6 s
FVT: 0
LV LEAD IMPEDENCE ICD: 764 Ohm
RV LEAD IMPEDENCE ICD: 512 Ohm
TOT-0006: 20121025000000
TZAT-0001FASTVT: 1
TZAT-0001SLOWVT: 1
TZAT-0001SLOWVT: 2
TZAT-0013FASTVT: 1
TZAT-0013SLOWVT: 2
TZAT-0018FASTVT: NEGATIVE
TZAT-0018SLOWVT: NEGATIVE
TZST-0001FASTVT: 3
TZST-0001FASTVT: 4
TZST-0001FASTVT: 7
TZST-0001SLOWVT: 4
TZST-0001SLOWVT: 5
TZST-0003FASTVT: 41 J
TZST-0003FASTVT: 41 J
TZST-0003FASTVT: 41 J
TZST-0003SLOWVT: 41 J
TZST-0003SLOWVT: 41 J
TZST-0003SLOWVT: 41 J

## 2011-05-31 NOTE — Progress Notes (Signed)
Remote icd check  

## 2011-06-15 ENCOUNTER — Other Ambulatory Visit: Payer: Self-pay | Admitting: Internal Medicine

## 2011-06-15 ENCOUNTER — Encounter: Payer: Self-pay | Admitting: *Deleted

## 2011-06-19 ENCOUNTER — Ambulatory Visit (HOSPITAL_COMMUNITY)
Admission: RE | Admit: 2011-06-19 | Discharge: 2011-06-19 | Disposition: A | Discharge status: Home / Self Care | Payer: PRIVATE HEALTH INSURANCE | Source: Ambulatory Visit | Attending: Internal Medicine | Admitting: Internal Medicine

## 2011-06-19 VITALS — BP 116/58 | HR 64 | Wt 246.5 lb

## 2011-06-19 DIAGNOSIS — I5022 Chronic systolic (congestive) heart failure: Secondary | ICD-10-CM | POA: Insufficient documentation

## 2011-06-19 MED ORDER — FUROSEMIDE 40 MG PO TABS: 40.0000 mg | ORAL_TABLET | ORAL | Status: DC | PRN

## 2011-06-19 NOTE — Assessment & Plan Note (Signed)
No evidence of ischemia. Continue current regimen.   

## 2011-06-19 NOTE — Assessment & Plan Note (Addendum)
Symptomatically NYHA II-III . Volume status looks good. Continue current regimen. He is instructed to take lasix if his weight goes up 3 pounds in 24 hours.  Discussed adding digoxin however, he declines.  Re-educated due to low sodium diet however he prefers diet of choice. Follow up in 3 months.  Patient seen and examined with Tonye Becket, NP. We discussed all aspects of the encounter. I agree with the assessment and plan as stated above. We had long talk about [place] of sliding scale lasix but he is very reluctant to take a diuretic as this made him feel dizzy in past. I suggested only to use it as needed for swelling and increasing dyspnea. Does not want digoxin.

## 2011-06-19 NOTE — Patient Instructions (Addendum)
Do the following things EVERYDAY: 1) Weigh yourself in the morning before breakfast. Write it down and keep it in a log. 2) Take your medicines as prescribed 3) Eat low salt foods-Limit salt (sodium) to 2000mg  per day.  4) Stay as active as you can everyday     Please take Lasix 40 mg daily if your weigh goes above 250 pounds.    Follow up in 3 months.

## 2011-06-19 NOTE — Progress Notes (Signed)
Patient ID: [name], male   DOB: 02-Jun-1956, 55 y.o.   MRN: [id number] HPI:  [name] is a 55 year old male with history of congestive heart failure secondary to ischemic cardiomyopathy with an EF of 20%.  He is status post multiple myocardial infarctions and previous stents.  He has a biventricular ICD in place. He underderwent device generator replacement in July 2011.   Remainder of medical history includes diabetes, obesity, hypertension, and a low HDL.   Myoview 7/11 which showed EF 17% with markedly dilated LV  EDV: 509 ml ESV: 422 ml. Extensive scar with trivial peri-infarct ischemia.  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 He had extensive discussion with Dr. Graciela Husbands about this recently. Tried carvedilol in past, but did not tolerate due to diarrhea.  Disability approved 04/2011  He returns for follow up today.  Denies CP/PND/ Othopnea. Occasional back pain. No shocks.  Exertional SOB. He does not exercise . He does not take diuretic. He does not want diuretics due dizziness he had in the past. Weight at home 240-247. Compliant with medications. Does not follow low salt diet.    ROS: All systems negative except as listed in HPI, PMH and Problem List.  Past Medical History  Diagnosis Date  . CHF (congestive heart failure)     secondary to severe ischemic cardiomyopathy.   a.    EF 20% with akinesis of the distal half to two-thirds of the ventricle.  Trivial MR   b.     Status post bi-V ICD.   c.     Cardiopulmonary exercise test (12/11) pVO2 of 24.5,VE/VCO2 slope is 31,.7  . CAD (coronary artery disease)     multiple MIs  . Hyperlipidemia   . Left bundle branch block   . Diabetes mellitus   . ICD (implantable cardiac defibrillator) in place 10/2009    shocks due to sinus tac    Current Outpatient Prescriptions  Medication Sig Dispense Refill  . aspirin 81 MG tablet Take 81 mg by  mouth daily.        Marland Kitchen atorvastatin (LIPITOR) 40 MG tablet Take 40 mg by mouth daily.        . bisoprolol (ZEBETA) 10 MG tablet Take one tablet by mouth daily  30 tablet  6  . glimepiride (AMARYL) 4 MG tablet Take 4 mg by mouth daily.        . ramipril (ALTACE) 5 MG capsule Take 5 mg by mouth daily.       Marland Kitchen spironolactone (ALDACTONE) 25 MG tablet TAKE ONE TABLET BY MOUTH EVERY DAY  30 tablet  8  . zolpidem (AMBIEN CR) 12.5 MG CR tablet Take 12.5 mg by mouth as needed.           PHYSICAL EXAM: Filed Vitals:   06/19/11 1004  BP: 116/58  Pulse: 64  General:  The patient was alert and oriented in no acute distress. HEENT Normal.  Neck veins were flat, carotids were brisk.  Lungs were clear.  Heart sounds were regular without murmurs or gallops.  Abdomen was soft with active bowel sounds. There is no clubbing cyanosis or edema. Skin Warm and dry Neuro: affect normal  cranial nerves intact.    ECG:    ASSESSMENT & PLAN:

## 2011-06-25 NOTE — Progress Notes (Signed)
Patient seen and examined with Amy Clegg, NP. We discussed all aspects of the encounter. I agree with the assessment and plan as stated below.   

## 2011-07-05 ENCOUNTER — Other Ambulatory Visit: Payer: Self-pay | Admitting: Internal Medicine

## 2011-08-24 ENCOUNTER — Ambulatory Visit (INDEPENDENT_AMBULATORY_CARE_PROVIDER_SITE_OTHER): Payer: PRIVATE HEALTH INSURANCE | Admitting: *Deleted

## 2011-08-24 ENCOUNTER — Encounter: Payer: Self-pay | Admitting: Internal Medicine

## 2011-08-24 DIAGNOSIS — I428 Other cardiomyopathies: Secondary | ICD-10-CM

## 2011-08-24 LAB — REMOTE ICD DEVICE
ATRIAL PACING ICD: 0 pct
DEVICE MODEL ICD: 139171
FVT: 0
LV LEAD AMPLITUDE: 22.9 mv
LV LEAD IMPEDENCE ICD: 758 Ohm
PACEART VT: 0
TOT-0006: 20121025000000
TZAT-0001FASTVT: 1
TZAT-0001SLOWVT: 1
TZAT-0013FASTVT: 1
TZAT-0013SLOWVT: 2
TZAT-0018FASTVT: NEGATIVE
TZAT-0018FASTVT: NEGATIVE
TZAT-0018SLOWVT: NEGATIVE
TZON-0003SLOWVT: 315.8 ms
TZST-0001FASTVT: 3
TZST-0001FASTVT: 4
TZST-0001FASTVT: 7
TZST-0001SLOWVT: 3
TZST-0001SLOWVT: 6
TZST-0001SLOWVT: 7
TZST-0003FASTVT: 41 J
TZST-0003FASTVT: 41 J
TZST-0003SLOWVT: 41 J
TZST-0003SLOWVT: 41 J
TZST-0003SLOWVT: 41 J
TZST-0003SLOWVT: 41 J
VF: 0

## 2011-09-01 NOTE — Progress Notes (Signed)
ICD remote 

## 2011-10-02 ENCOUNTER — Encounter: Payer: Self-pay | Admitting: Internal Medicine

## 2011-10-31 ENCOUNTER — Encounter: Payer: Self-pay | Admitting: Internal Medicine

## 2011-11-05 ENCOUNTER — Other Ambulatory Visit (HOSPITAL_COMMUNITY): Payer: Self-pay | Admitting: Internal Medicine

## 2011-11-10 ENCOUNTER — Encounter (HOSPITAL_COMMUNITY): Payer: Self-pay

## 2011-11-10 ENCOUNTER — Ambulatory Visit (HOSPITAL_COMMUNITY)
Admission: RE | Admit: 2011-11-10 | Discharge: 2011-11-10 | Disposition: A | Discharge status: Home / Self Care | Payer: PRIVATE HEALTH INSURANCE | Source: Ambulatory Visit | Attending: Internal Medicine | Admitting: Internal Medicine

## 2011-11-10 VITALS — BP 100/60 | HR 60 | Ht 76.0 in | Wt 246.0 lb

## 2011-11-10 DIAGNOSIS — I5023 Acute on chronic systolic (congestive) heart failure: Secondary | ICD-10-CM | POA: Insufficient documentation

## 2011-11-10 DIAGNOSIS — I5022 Chronic systolic (congestive) heart failure: Secondary | ICD-10-CM

## 2011-11-10 DIAGNOSIS — I2510 Atherosclerotic heart disease of native coronary artery without angina pectoris: Secondary | ICD-10-CM

## 2011-11-10 LAB — BASIC METABOLIC PANEL
BUN: 17 mg/dL (ref 6–23)
Creatinine, Ser: 0.94 mg/dL (ref 0.50–1.35)
GFR calc non Af Amer: >90 mL/min (ref >90)
Glucose, Bld: 181 mg/dL — ABNORMAL HIGH (ref 70–99)
Potassium: 4.2 mEq/L (ref 3.5–5.1)

## 2011-11-10 NOTE — Patient Instructions (Signed)
Follow up in 4 months  Do the following things EVERYDAY: 1) Weigh yourself in the morning before breakfast. Write it down and keep it in a log. 2) Take your medicines as prescribed 3) Eat low salt foods--Limit salt (sodium) to 2000mg per day.  4) Stay as active as you can everyday 

## 2011-11-10 NOTE — Assessment & Plan Note (Signed)
No evidence of ischemia. Continue current regimen.   

## 2011-11-10 NOTE — Progress Notes (Signed)
Patient ID: [name], male   DOB: 10-19-1955, 56 y.o.   MRN: [id number] HPI:  [name] is a 56 year old male with history of congestive heart failure secondary to ischemic cardiomyopathy with an EF of 20%.  He is status post multiple myocardial infarctions and previous stents.  He has a biventricular ICD in place. He underderwent device generator replacement in July 2011.   Remainder of medical history includes diabetes, obesity, hypertension, and a low HDL.   Myoview 7/11 which showed EF 17% with markedly dilated LV  EDV: 509 ml ESV: 422 ml. Extensive scar with trivial peri-infarct ischemia.  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 He had extensive discussion with Dr. Graciela Husbands about this recently. Tried carvedilol in past, but did not tolerate due to diarrhea.  Disability approved 04/2011  He returns for follow up today.  Denies CP/PND/ Othopnea. SOB after walking 500 feet. He has started taking Lasix daily over the last 2 months. Weight at home 240-245. No ICD shocks. Denies lower extremity edema. He does not want to take any additional medications. Compliant low salt diet and fluid restriction.   ROS: All systems negative except as listed in HPI, PMH and Problem List.  Past Medical History  Diagnosis Date  . CHF (congestive heart failure)     secondary to severe ischemic cardiomyopathy.   a.    EF 20% with akinesis of the distal half to two-thirds of the ventricle.  Trivial MR   b.     Status post bi-V ICD.   c.     Cardiopulmonary exercise test (12/11) pVO2 of 24.5,VE/VCO2 slope is 31,.7  . CAD (coronary artery disease)     multiple MIs  . Hyperlipidemia   . Left bundle branch block   . Diabetes mellitus   . ICD (implantable cardiac defibrillator) in place 10/2009    shocks due to sinus tac  . Diverticulosis   . Colon polyps   . Hemorrhoids     Current Outpatient Prescriptions    Medication Sig Dispense Refill  . aspirin 81 MG tablet Take 81 mg by mouth daily.        Marland Kitchen atorvastatin (LIPITOR) 40 MG tablet Take 40 mg by mouth daily. Taking 1/2 daily      . bisoprolol (ZEBETA) 10 MG tablet TAKE ONE TABLET BY MOUTH EVERY DAY  30 tablet  6  . furosemide (LASIX) 40 MG tablet Take 1 tablet (40 mg total) by mouth as needed.  30 tablet  6  . glimepiride (AMARYL) 4 MG tablet Take 4 mg by mouth daily.        . ramipril (ALTACE) 5 MG capsule Take 1 capsule (5 mg total) by mouth daily.  30 capsule  5  . spironolactone (ALDACTONE) 25 MG tablet TAKE ONE TABLET BY MOUTH EVERY DAY  30 tablet  8  . zolpidem (AMBIEN CR) 12.5 MG CR tablet Take 12.5 mg by mouth as needed.           PHYSICAL EXAM: Filed Vitals:   11/10/11 0910  BP: 100/60  Pulse: 60  Weight 246 (246) General:  The patient was alert and oriented in no acute distress. HEENT Normal.  Neck veins were flat, carotids were brisk.  Lungs were clear.  Heart sounds were regular without murmurs or gallops.  Abdomen was soft with active bowel sounds. There is no  clubbing cyanosis or edema. Skin Warm and dry Neuro: affect normal  cranial nerves intact.    ECG:    ASSESSMENT & PLAN:

## 2011-11-10 NOTE — Assessment & Plan Note (Signed)
NYHA II-III. Volume status stable. Continue current diuretic regimen. Check BMET today. Discussed addition of digoxin however he declines. Follow up in 4 months.

## 2011-11-23 ENCOUNTER — Ambulatory Visit (INDEPENDENT_AMBULATORY_CARE_PROVIDER_SITE_OTHER): Payer: PRIVATE HEALTH INSURANCE | Admitting: *Deleted

## 2011-11-23 ENCOUNTER — Encounter: Payer: Self-pay | Admitting: Internal Medicine

## 2011-11-23 DIAGNOSIS — I509 Heart failure, unspecified: Secondary | ICD-10-CM

## 2011-11-23 DIAGNOSIS — Z95810 Presence of automatic (implantable) cardiac defibrillator: Secondary | ICD-10-CM

## 2011-12-01 LAB — REMOTE ICD DEVICE
ATRIAL PACING ICD: 0 pct
CHARGE TIME: 8.7 s
FVT: 0
LV LEAD AMPLITUDE: 11.1 mv
LV LEAD IMPEDENCE ICD: 786 Ohm
PACEART VT: 0
TOT-0006: 20130214000000
TZAT-0001SLOWVT: 2
TZAT-0013FASTVT: 1
TZAT-0013SLOWVT: 2
TZAT-0018FASTVT: NEGATIVE
TZAT-0018SLOWVT: NEGATIVE
TZON-0003SLOWVT: 315.8 ms
TZST-0001FASTVT: 3
TZST-0001FASTVT: 4
TZST-0001FASTVT: 7
TZST-0001FASTVT: 8
TZST-0001SLOWVT: 6
TZST-0001SLOWVT: 7
TZST-0003FASTVT: 41 J
TZST-0003FASTVT: 41 J
TZST-0003FASTVT: 41 J
TZST-0003SLOWVT: 41 J
TZST-0003SLOWVT: 41 J
TZST-0003SLOWVT: 41 J

## 2011-12-11 ENCOUNTER — Encounter: Payer: Self-pay | Admitting: *Deleted

## 2012-02-01 ENCOUNTER — Other Ambulatory Visit: Payer: Self-pay | Admitting: Internal Medicine

## 2012-02-01 NOTE — Telephone Encounter (Signed)
..   Requested Prescriptions   Pending Prescriptions Disp Refills  . bisoprolol (ZEBETA) 10 MG tablet [Pharmacy Med Name: BISOPROL FUM 10MG    TAB] 30 tablet 3    Sig: TAKE ONE TABLET BY MOUTH EVERY DAY

## 2012-03-22 ENCOUNTER — Encounter: Payer: Self-pay | Admitting: Internal Medicine

## 2012-03-22 ENCOUNTER — Ambulatory Visit (INDEPENDENT_AMBULATORY_CARE_PROVIDER_SITE_OTHER): Payer: PRIVATE HEALTH INSURANCE | Admitting: Internal Medicine

## 2012-03-22 VITALS — BP 134/76 | HR 61 | Ht 76.0 in | Wt 244.0 lb

## 2012-03-22 DIAGNOSIS — I2589 Other forms of chronic ischemic heart disease: Secondary | ICD-10-CM

## 2012-03-22 DIAGNOSIS — I5022 Chronic systolic (congestive) heart failure: Secondary | ICD-10-CM

## 2012-03-22 DIAGNOSIS — I255 Ischemic cardiomyopathy: Secondary | ICD-10-CM

## 2012-03-22 DIAGNOSIS — Z95810 Presence of automatic (implantable) cardiac defibrillator: Secondary | ICD-10-CM

## 2012-03-22 LAB — ICD DEVICE OBSERVATION
AL IMPEDENCE ICD: 514 Ohm
HV IMPEDENCE: 64 Ohm
LV LEAD IMPEDENCE ICD: 761 Ohm
LV LEAD THRESHOLD: 1 V
RV LEAD AMPLITUDE: 25 mv
RV LEAD THRESHOLD: 0.7 V
TZAT-0001FASTVT: 1
TZAT-0001SLOWVT: 2
TZAT-0002FASTVT: NEGATIVE
TZAT-0013FASTVT: 1
TZAT-0013SLOWVT: 2
TZAT-0018FASTVT: NEGATIVE
TZAT-0018FASTVT: NEGATIVE
TZAT-0018SLOWVT: NEGATIVE
TZON-0003SLOWVT: 315.8 ms
TZST-0001FASTVT: 4
TZST-0001FASTVT: 5
TZST-0001FASTVT: 8
TZST-0001SLOWVT: 5
TZST-0001SLOWVT: 7
TZST-0003FASTVT: 41 J
TZST-0003FASTVT: 41 J
TZST-0003FASTVT: 41 J
TZST-0003SLOWVT: 41 J
TZST-0003SLOWVT: 41 J
VENTRICULAR PACING ICD: 95 pct

## 2012-03-22 NOTE — Progress Notes (Signed)
Patient Care Team: Kari Baars, MD as PCP - General (Internal Medicine)   HPI  [name] is a 56 y.o. male Seen in followup for congestive heart failure in the setting ischemic cardiomyopathy. with history of congestive heart failure secondary to ischemic cardiomyopathy with an EF of 20%.  He is status post CRT-D implantation and underwent device generator replacement July 2011. He is status post multiple myocardial infarctions and previous stents.  He has a biventricular ICD in place. He under derwent device generator replacement in July 2011.  Myoview 2011 which showed EF 17% with markedly dilated LV  EDV: 509 ml ESV: 422 ml. Extensive scar with trivial peri-infarct ischemia.  CPX  12/11 pVO2 24.5 slope 31.7 RER 1.12 O2 pulse 105%   Continue to have problems with exertional dyspnea and fatigue. Occasional CP.  Doing some better got his disability  Past Medical History  Diagnosis Date  . CHF (congestive heart failure)     secondary to severe ischemic cardiomyopathy.   a.    EF 20% with akinesis of the distal half to two-thirds of the ventricle.  Trivial MR   b.     Status post bi-V ICD.   c.     Cardiopulmonary exercise test (12/11) pVO2 of 24.5,VE/VCO2 slope is 31,.7  . CAD (coronary artery disease)     multiple MIs  . Hyperlipidemia   . Left bundle branch block   . Diabetes mellitus   . ICD (implantable cardiac defibrillator) in place 10/2009    shocks due to sinus tac  . Diverticulosis   . Colon polyps   . Hemorrhoids     Past Surgical History  Procedure Date  . Single-chamber icd implantation 08/10/2003  . Dual chamber defibrillator 06/15/2004    Current Outpatient Prescriptions  Medication Sig Dispense Refill  . aspirin 81 MG tablet Take 81 mg by mouth daily.        Marland Kitchen atorvastatin (LIPITOR) 20 MG tablet Take 20 mg by mouth daily.      . bisoprolol (ZEBETA) 10 MG tablet TAKE ONE TABLET BY MOUTH EVERY DAY  30 tablet  3  . furosemide (LASIX) 40 MG tablet Take 1  tablet (40 mg total) by mouth as needed.  30 tablet  6  . glimepiride (AMARYL) 4 MG tablet Take 4 mg by mouth daily.        . ramipril (ALTACE) 5 MG capsule Take 1 capsule (5 mg total) by mouth daily.  30 capsule  5  . spironolactone (ALDACTONE) 25 MG tablet TAKE ONE TABLET BY MOUTH EVERY DAY  30 tablet  8  . zolpidem (AMBIEN CR) 12.5 MG CR tablet Take 12.5 mg by mouth as needed.          No Known Allergies  Review of Systems negative except from HPI and PMH  Physical Exam BP 134/76  Pulse 61  Ht 6\' 4"  (1.93 m)  Wt 244 lb (110.678 kg)  BMI 29.70 kg/m2 Well developed and nourished in no acute distress HENT normal Neck supple with JVP-flat Clear Regular rate and rhythm, no murmurs or gallops Abd-soft with active BS No Clubbing cyanosis edema Skin-warm and dry A & Oriented  Grossly normal sensory and motor function     Assessment and  Plan

## 2012-03-22 NOTE — Assessment & Plan Note (Signed)
Continue current meds;  No angina

## 2012-03-22 NOTE — Patient Instructions (Signed)
Your physician wants you to follow-up in: 1 year with Dr Klein.  You will receive a reminder letter in the mail two months in advance. If you don't receive a letter, please call our office to schedule the follow-up appointment.  

## 2012-03-22 NOTE — Assessment & Plan Note (Signed)
The patient's device was interrogated.  The information was reviewed. No changes were made in the programming.    

## 2012-03-22 NOTE — Assessment & Plan Note (Signed)
Stable  And euvolemic continue current  meds

## 2012-04-03 ENCOUNTER — Other Ambulatory Visit (HOSPITAL_COMMUNITY): Payer: Self-pay | Admitting: Adult Health

## 2012-04-03 ENCOUNTER — Other Ambulatory Visit: Payer: Self-pay | Admitting: Internal Medicine

## 2012-05-05 ENCOUNTER — Other Ambulatory Visit (HOSPITAL_COMMUNITY): Payer: Self-pay | Admitting: Internal Medicine

## 2012-06-03 ENCOUNTER — Other Ambulatory Visit: Payer: Self-pay | Admitting: Internal Medicine

## 2012-06-27 ENCOUNTER — Encounter: Payer: Self-pay | Admitting: Internal Medicine

## 2012-06-27 ENCOUNTER — Ambulatory Visit (INDEPENDENT_AMBULATORY_CARE_PROVIDER_SITE_OTHER): Payer: PRIVATE HEALTH INSURANCE | Admitting: *Deleted

## 2012-06-27 DIAGNOSIS — Z95810 Presence of automatic (implantable) cardiac defibrillator: Secondary | ICD-10-CM

## 2012-06-27 DIAGNOSIS — I255 Ischemic cardiomyopathy: Secondary | ICD-10-CM

## 2012-06-27 DIAGNOSIS — I2589 Other forms of chronic ischemic heart disease: Secondary | ICD-10-CM

## 2012-07-01 ENCOUNTER — Other Ambulatory Visit: Payer: Self-pay | Admitting: Internal Medicine

## 2012-07-01 LAB — REMOTE ICD DEVICE
AL IMPEDENCE ICD: 523 Ohm
BRDY-0002RA: 45 {beats}/min
BRDY-0004RA: 130 {beats}/min
DEVICE MODEL ICD: 139171
FVT: 0
HV IMPEDENCE: 64 Ohm
TZAT-0001FASTVT: 2
TZAT-0002FASTVT: NEGATIVE
TZAT-0013FASTVT: 1
TZAT-0013SLOWVT: 2
TZAT-0018SLOWVT: NEGATIVE
TZON-0003FASTVT: 285.7 ms
TZST-0001FASTVT: 4
TZST-0001FASTVT: 5
TZST-0001SLOWVT: 3
TZST-0001SLOWVT: 4
TZST-0003FASTVT: 41 J
TZST-0003FASTVT: 41 J
TZST-0003FASTVT: 41 J
TZST-0003FASTVT: 41 J
TZST-0003SLOWVT: 41 J
TZST-0003SLOWVT: 41 J
VENTRICULAR PACING ICD: 99 pct
VF: 0

## 2012-07-08 ENCOUNTER — Encounter: Payer: Self-pay | Admitting: *Deleted

## 2012-08-05 ENCOUNTER — Other Ambulatory Visit: Payer: Self-pay | Admitting: Internal Medicine

## 2012-08-08 ENCOUNTER — Other Ambulatory Visit: Payer: Self-pay

## 2012-08-08 MED ORDER — BISOPROLOL FUMARATE 10 MG PO TABS: 10.0000 mg | ORAL_TABLET | Freq: Every day | ORAL | Status: DC

## 2012-08-26 ENCOUNTER — Ambulatory Visit (INDEPENDENT_AMBULATORY_CARE_PROVIDER_SITE_OTHER): Payer: BC Managed Care – PPO | Admitting: General Surgery

## 2012-09-03 ENCOUNTER — Encounter (INDEPENDENT_AMBULATORY_CARE_PROVIDER_SITE_OTHER): Payer: Self-pay | Admitting: General Surgery

## 2012-09-03 ENCOUNTER — Ambulatory Visit (INDEPENDENT_AMBULATORY_CARE_PROVIDER_SITE_OTHER): Payer: BC Managed Care – PPO | Admitting: General Surgery

## 2012-09-03 VITALS — BP 120/70 | HR 57 | Temp 97.8°F | Resp 18 | Ht 76.0 in | Wt 234.0 lb

## 2012-09-03 DIAGNOSIS — K648 Other hemorrhoids: Secondary | ICD-10-CM

## 2012-09-03 NOTE — Patient Instructions (Signed)

## 2012-09-03 NOTE — Progress Notes (Signed)
Patient ID: [name], male   DOB: [birth year], 57 y.o.   MRN: [id number]  Chief Complaint  Patient presents with  . New Evaluation    eval hems    HPI [name] is a 57 y.o. male.  Referred by Dr Eric Form HPI This is a 57 year old male who I do not have his notes from Dr. Clelia Croft today. He has a significant cardiac history. We discussed most of this today. His main complaint for which he comes and sees me today is what he describes her hemorrhoids. 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 This will flare once a year and cause some pressure but is otherwise not really bothering him.  He comes in today because the entire month of December he had perianal pain. He has an external tag in the anterior position that has been present for a number of years. He showed me a picture of all of this today as well that was on his phone. He had had an area that was prolapsing and painful This as well. He does require this to be pushed back in. He also has 2 small areas on the posterior area which he describes as someone has a lighter on them all the time. This is now better. These do not bother him nearly as much. He does have a history of an up-to-date colonoscopy. He has several bowel movements per day. He had been having a lot of diarrhea which she thinks is medication related. He had some spotting associated with this as well. He comes in today to discuss treatment of his hemorrhoids. Past Medical History  Diagnosis Date  . CHF (congestive heart failure)     secondary to severe ischemic cardiomyopathy.   a.    EF 20% with akinesis of the distal half to two-thirds of the ventricle.  Trivial MR   b.     Status post bi-V ICD.   c.     Cardiopulmonary exercise test (12/11) pVO2 of 24.5,VE/VCO2 slope is 31,.7  . CAD (coronary artery disease)     multiple MIs  . Hyperlipidemia   . Left bundle branch block   . Diabetes mellitus   .  ICD (implantable cardiac defibrillator) in place 10/2009    shocks due to sinus tac  . Diverticulosis   . Colon polyps   . Hemorrhoids   . Tinnitus   . Insomnia   . Prostate neoplasm     Past Surgical History  Procedure Laterality Date  . Single-chamber icd implantation  08/10/2003  . Dual chamber defibrillator  06/15/2004    Family History  Problem Relation Age of Onset  . Coronary artery disease      Social History History  Substance Use Topics  . Smoking status: Former Smoker -- 2.00 packs/day for 30 years    Types: Cigarettes    Quit date: 07/20/2003  . Smokeless tobacco: Not on file  . Alcohol Use: Yes     Comment: Occasional.    No Known Allergies  Current Outpatient Prescriptions  Medication Sig Dispense Refill  . aspirin 81 MG tablet Take 81 mg by mouth daily.        Marland Kitchen atorvastatin (LIPITOR) 20 MG tablet Take 20 mg by mouth daily.      . bisoprolol (ZEBETA) 10 MG tablet Take 1 tablet (10 mg total) by mouth daily.  30 tablet  6  . furosemide (LASIX)  40 MG tablet TAKE ONE TABLET (40MG ) BY MOUTH AS NEEDED  30 tablet  5  . glimepiride (AMARYL) 4 MG tablet Take 4 mg by mouth daily.        . hydrocortisone (ANUSOL-HC) 2.5 % rectal cream Place rectally 2 (two) times daily.      . ramipril (ALTACE) 5 MG capsule TAKE ONE CAPSULE BY MOUTH EVERY DAY  30 capsule  11  . spironolactone (ALDACTONE) 25 MG tablet TAKE ONE TABLET BY MOUTH EVERY DAY  30 tablet  7  . zolpidem (AMBIEN CR) 12.5 MG CR tablet Take 12.5 mg by mouth as needed.         No current facility-administered medications for this visit.    Review of Systems Review of Systems  Constitutional: Negative for fever, chills and unexpected weight change.  HENT: Negative for hearing loss, congestion, sore throat, trouble swallowing and voice change.   Eyes: Negative for visual disturbance.  Respiratory: Negative for cough and wheezing.   Cardiovascular: Negative for chest pain, palpitations and leg swelling.   Gastrointestinal: Negative for nausea, vomiting, abdominal pain, diarrhea, constipation, blood in stool, abdominal distention, anal bleeding and rectal pain.  Genitourinary: Negative for hematuria and difficulty urinating.  Musculoskeletal: Negative for arthralgias.  Skin: Negative for rash and wound.  Neurological: Negative for seizures, syncope, weakness and headaches.  Hematological: Negative for adenopathy. Does not bruise/bleed easily.  Psychiatric/Behavioral: Negative for confusion.    Blood pressure 120/70, pulse 57, temperature 97.8 F (36.6 C), temperature source Temporal, resp. rate 18, height 6\' 4"  (1.93 m), weight 234 lb (106.142 kg).  Physical Exam Physical Exam  Vitals reviewed. Constitutional: He appears well-developed and well-nourished.  Abdominal: Soft.  Genitourinary: Rectal exam shows external hemorrhoid (posterior tag) and internal hemorrhoid. Rectal exam shows no fissure, no mass, no tenderness and anal tone normal.      Assessment    Internal hemorrhoids External tag    Plan    He is much better now from December. We discussed some conservative measures.  I think primary problem is from prolapsing internal hemorrhoid.  I did anoscopy today and identified one large prolapsing internal hemorrhoid that I banded.  We had discussed risks of banding including some pain, bleeding, infection.  I will plan on seeing him back in 4 weeks to reassess and see if any of these other areas need to be treated.       Joie Reamer 09/03/2012, 10:56 AM

## 2012-09-07 HISTORY — PX: HEMORRHOID BANDING: SHX5850

## 2012-10-03 ENCOUNTER — Other Ambulatory Visit: Payer: Self-pay

## 2012-10-03 ENCOUNTER — Encounter (INDEPENDENT_AMBULATORY_CARE_PROVIDER_SITE_OTHER): Payer: Self-pay | Admitting: General Surgery

## 2012-10-03 ENCOUNTER — Ambulatory Visit (INDEPENDENT_AMBULATORY_CARE_PROVIDER_SITE_OTHER): Payer: BC Managed Care – PPO | Admitting: General Surgery

## 2012-10-03 ENCOUNTER — Ambulatory Visit (INDEPENDENT_AMBULATORY_CARE_PROVIDER_SITE_OTHER): Payer: BC Managed Care – HMO | Admitting: *Deleted

## 2012-10-03 VITALS — BP 126/86 | HR 60 | Temp 97.0°F | Resp 14 | Ht 76.0 in | Wt 236.0 lb

## 2012-10-03 DIAGNOSIS — I255 Ischemic cardiomyopathy: Secondary | ICD-10-CM

## 2012-10-03 DIAGNOSIS — Z95810 Presence of automatic (implantable) cardiac defibrillator: Secondary | ICD-10-CM

## 2012-10-03 DIAGNOSIS — K648 Other hemorrhoids: Secondary | ICD-10-CM

## 2012-10-03 DIAGNOSIS — I2589 Other forms of chronic ischemic heart disease: Secondary | ICD-10-CM

## 2012-10-03 NOTE — Progress Notes (Signed)
Subjective:     Patient ID: [name], male   DOB: Jan 22, 1956, 57 y.o.   MRN: [id number]  HPI 57 yom who I saw recently with complaints referable to hemorrhoids. I ended up banding a single internal complex that I thought was causing most of his complaints.  He did well with exam but stated that this was sore for two days afterwards and asked the size of the scope that I used. I assured him I used standard equipment.  He comes in today feeling much better and really does not have a lot of complaints referable to anus.  Review of Systems     Objective:   Physical Exam Deferred due to improvement    Assessment:     S/p banding    Plan:     He still has external tag but I think observation until he has some symptoms especially with cardiac history is reasonable.  He states he is due to colonoscopy with dr Marina Goodell and I encouraged him to do this and then see me if he has having trouble.

## 2012-10-05 ENCOUNTER — Other Ambulatory Visit (HOSPITAL_COMMUNITY): Payer: Self-pay | Admitting: Internal Medicine

## 2012-10-16 ENCOUNTER — Encounter: Payer: Self-pay | Admitting: Internal Medicine

## 2012-10-18 ENCOUNTER — Encounter: Payer: Self-pay | Admitting: Internal Medicine

## 2012-10-18 ENCOUNTER — Telehealth (HOSPITAL_COMMUNITY): Payer: Self-pay | Admitting: Cardiology

## 2012-10-18 NOTE — Telephone Encounter (Signed)
Pt wanted to know if Dr.Bensimhon would see him again in the CHF clinic

## 2012-10-21 NOTE — Telephone Encounter (Signed)
Yes he is our patient and we saw him in May of last year, thanks

## 2012-10-24 LAB — REMOTE ICD DEVICE
AL AMPLITUDE: 7 mv
AL IMPEDENCE ICD: 519 Ohm
ATRIAL PACING ICD: 0 pct
CHARGE TIME: 8.9 s
DEVICE MODEL ICD: 139171
HV IMPEDENCE: 62 Ohm
LV LEAD IMPEDENCE ICD: 744 Ohm
RV LEAD AMPLITUDE: 25 mv
TZAT-0001FASTVT: 1
TZAT-0001SLOWVT: 2
TZAT-0002FASTVT: NEGATIVE
TZAT-0013FASTVT: 1
TZAT-0013SLOWVT: 2
TZAT-0018FASTVT: NEGATIVE
TZAT-0018SLOWVT: NEGATIVE
TZON-0003SLOWVT: 315.8 ms
TZST-0001FASTVT: 3
TZST-0001FASTVT: 4
TZST-0001FASTVT: 5
TZST-0001FASTVT: 8
TZST-0001SLOWVT: 5
TZST-0001SLOWVT: 7
TZST-0003FASTVT: 41 J
TZST-0003FASTVT: 41 J
TZST-0003FASTVT: 41 J
TZST-0003FASTVT: 41 J
TZST-0003SLOWVT: 41 J
TZST-0003SLOWVT: 41 J
VENTRICULAR PACING ICD: 96 pct
VF: 0

## 2012-11-19 ENCOUNTER — Telehealth: Payer: Self-pay | Admitting: *Deleted

## 2012-11-19 NOTE — Telephone Encounter (Signed)
Pt scheduled for NP appt with Dr. Marina Goodell on Wed June 4 at 11:00.  Colonoscopy for 5/29 and PV for 5/15 cancelled pt aware of appt changes.

## 2012-11-19 NOTE — Telephone Encounter (Signed)
Dr Marina Goodell:  Pt is scheduled for recall colonoscopy at [facility] 5/29.  PV scheduled for 5/15.  Pt has CHF, ischemic cardiomyopathy with EF of 20%. He also has ICD.  Do you want to see this patient in office or can he be scheduled for at [facility] direct?

## 2012-11-19 NOTE — Telephone Encounter (Signed)
Needs office visit with me at his convenience. Thanks

## 2012-11-20 ENCOUNTER — Ambulatory Visit (HOSPITAL_COMMUNITY)
Admission: RE | Admit: 2012-11-20 | Discharge: 2012-11-20 | Disposition: A | Discharge status: Home / Self Care | Payer: BC Managed Care – HMO | Source: Ambulatory Visit | Attending: Internal Medicine | Admitting: Internal Medicine

## 2012-11-20 ENCOUNTER — Encounter (HOSPITAL_COMMUNITY): Payer: Self-pay

## 2012-11-20 VITALS — BP 130/76 | HR 64 | Ht 76.0 in | Wt 239.1 lb

## 2012-11-20 DIAGNOSIS — E669 Obesity, unspecified: Secondary | ICD-10-CM | POA: Insufficient documentation

## 2012-11-20 DIAGNOSIS — E119 Type 2 diabetes mellitus without complications: Secondary | ICD-10-CM | POA: Insufficient documentation

## 2012-11-20 DIAGNOSIS — I5022 Chronic systolic (congestive) heart failure: Secondary | ICD-10-CM | POA: Insufficient documentation

## 2012-11-20 DIAGNOSIS — I252 Old myocardial infarction: Secondary | ICD-10-CM | POA: Insufficient documentation

## 2012-11-20 DIAGNOSIS — I10 Essential (primary) hypertension: Secondary | ICD-10-CM | POA: Insufficient documentation

## 2012-11-20 DIAGNOSIS — R0602 Shortness of breath: Secondary | ICD-10-CM | POA: Insufficient documentation

## 2012-11-20 MED ORDER — RAMIPRIL 10 MG PO CAPS: 10.0000 mg | ORAL_CAPSULE | Freq: Every day | ORAL | Status: DC

## 2012-11-20 NOTE — Patient Instructions (Addendum)
Follow up in 1 year  Please schedule ECHO  Do the following things EVERYDAY: 1) Weigh yourself in the morning before breakfast. Write it down and keep it in a log. 2) Take your medicines as prescribed 3) Eat low salt foods-Limit salt (sodium) to 2000 mg per day.  4) Stay as active as you can everyday 5) Limit all fluids for the day to less than 2 liters

## 2012-11-20 NOTE — Assessment & Plan Note (Signed)
Patient seen and examined with Tonye Becket, NP. We discussed all aspects of the encounter. I agree with the assessment and plan as stated above.   Attending: Doing great NYHA II. Volume status looks great. Will increase ramipril to 10mg  daily. Reinforced need for daily weights and reviewed use of sliding scale diuretics. Due for repeat echo.

## 2012-11-20 NOTE — Progress Notes (Signed)
Patient ID: [name], male   DOB: Jul 09, 1956, 57 y.o.   MRN: [id number]  PCP: Dr Clelia Croft EP: Dr Graciela Husbands  HPI: [name] is a 57 year old male with history of chronic systolic heart failur EF 20% 2009, ICM, S/P 2 myocardial infarctions and previous stents, BiVi in place, S/P device generator replacement in July 2011, DM,  obesity, HTN, and a low HDL. Disabled since 2011.   Myoview 7/11 which showed EF 17% with markedly dilated LV  EDV: 509 ml ESV: 422 ml. Extensive scar with trivial peri-infarct ischemia.  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 He had extensive discussion with Dr. Graciela Husbands about this recently. Tried carvedilol in past, but did not tolerate due to diarrhea.  He returns for follow up today.  Denies CP/PND/ Othopnea. SOB after walking 600 feet. Weight at home 240-245 pounds. No ICD shocks. Denies lower extremity edema. Compliant with medications.  Compliant low salt diet and fluid restriction.   ROS: All systems negative except as listed in HPI, PMH and Problem List.  Past Medical History  Diagnosis Date  . CHF (congestive heart failure)     secondary to severe ischemic cardiomyopathy.   a.    EF 20% with akinesis of the distal half to two-thirds of the ventricle.  Trivial MR   b.     Status post bi-V ICD.   c.     Cardiopulmonary exercise test (12/11) pVO2 of 24.5,VE/VCO2 slope is 31,.7  . CAD (coronary artery disease)     multiple MIs  . Hyperlipidemia   . Left bundle branch block   . Diabetes mellitus   . ICD (implantable cardiac defibrillator) in place 10/2009    shocks due to sinus tac  . Diverticulosis   . Colon polyps   . Hemorrhoids   . Tinnitus   . Insomnia   . Prostate neoplasm     Current Outpatient Prescriptions  Medication Sig Dispense Refill  . aspirin 81 MG tablet Take 81 mg by mouth daily.        Marland Kitchen atorvastatin (LIPITOR) 20 MG tablet Take 20 mg by mouth daily.       . bisoprolol (ZEBETA) 10 MG tablet Take 1 tablet (10 mg total) by mouth daily.  30 tablet  6  . furosemide (LASIX) 40 MG tablet       . glimepiride (AMARYL) 4 MG tablet Take 4 mg by mouth daily.        . hydrocortisone (ANUSOL-HC) 2.5 % rectal cream Place 1 application rectally as needed.       . ramipril (ALTACE) 5 MG capsule TAKE ONE CAPSULE BY MOUTH EVERY DAY  30 capsule  11  . spironolactone (ALDACTONE) 25 MG tablet TAKE ONE TABLET BY MOUTH EVERY DAY  30 tablet  7  . zolpidem (AMBIEN CR) 12.5 MG CR tablet Take 12.5 mg by mouth daily.        No current facility-administered medications for this encounter.     PHYSICAL EXAM: Filed Vitals:   11/20/12 1108  BP: 130/76  Pulse: 64   General:  The patient was alert and oriented in no acute distress. HEENT Normal.  Neck veins were flat, carotids were brisk.  Lungs were clear.  Heart sounds were regular without murmurs or gallops.  Abdomen was soft with active bowel sounds. There is no clubbing cyanosis or edema. Skin Warm and  dry Neuro: affect normal  cranial nerves intact.    ASSESSMENT & PLAN:

## 2012-11-27 ENCOUNTER — Ambulatory Visit (HOSPITAL_COMMUNITY)
Admission: RE | Admit: 2012-11-27 | Discharge: 2012-11-27 | Disposition: A | Discharge status: Home / Self Care | Payer: BC Managed Care – HMO | Source: Ambulatory Visit | Attending: Internal Medicine | Admitting: Internal Medicine

## 2012-11-27 DIAGNOSIS — E119 Type 2 diabetes mellitus without complications: Secondary | ICD-10-CM | POA: Insufficient documentation

## 2012-11-27 DIAGNOSIS — I517 Cardiomegaly: Secondary | ICD-10-CM | POA: Insufficient documentation

## 2012-11-27 DIAGNOSIS — I5022 Chronic systolic (congestive) heart failure: Secondary | ICD-10-CM

## 2012-11-27 DIAGNOSIS — I5020 Unspecified systolic (congestive) heart failure: Secondary | ICD-10-CM | POA: Insufficient documentation

## 2012-11-27 DIAGNOSIS — I059 Rheumatic mitral valve disease, unspecified: Secondary | ICD-10-CM | POA: Insufficient documentation

## 2012-11-27 DIAGNOSIS — I509 Heart failure, unspecified: Secondary | ICD-10-CM | POA: Insufficient documentation

## 2012-11-27 NOTE — Progress Notes (Signed)
  Echocardiogram 2D Echocardiogram has been performed.  [name] 11/27/2012, 12:20 PM 

## 2012-12-03 ENCOUNTER — Other Ambulatory Visit: Payer: Self-pay | Admitting: Internal Medicine

## 2012-12-05 ENCOUNTER — Encounter: Payer: BC Managed Care – HMO | Admitting: Internal Medicine

## 2012-12-11 ENCOUNTER — Ambulatory Visit: Payer: BC Managed Care – HMO | Admitting: Internal Medicine

## 2012-12-12 ENCOUNTER — Encounter: Payer: Self-pay | Admitting: Internal Medicine

## 2013-01-02 ENCOUNTER — Ambulatory Visit (INDEPENDENT_AMBULATORY_CARE_PROVIDER_SITE_OTHER): Payer: BC Managed Care – HMO | Admitting: *Deleted

## 2013-01-02 ENCOUNTER — Other Ambulatory Visit: Payer: Self-pay

## 2013-01-02 DIAGNOSIS — Z95810 Presence of automatic (implantable) cardiac defibrillator: Secondary | ICD-10-CM

## 2013-01-02 DIAGNOSIS — I2589 Other forms of chronic ischemic heart disease: Secondary | ICD-10-CM

## 2013-01-02 DIAGNOSIS — I255 Ischemic cardiomyopathy: Secondary | ICD-10-CM

## 2013-01-13 LAB — REMOTE ICD DEVICE
BRDY-0002RA: 45 {beats}/min
BRDY-0003RA: 130 {beats}/min
BRDY-0004RA: 130 {beats}/min
DEVICE MODEL ICD: 139171
FVT: 0
HV IMPEDENCE: 65 Ohm
LV LEAD IMPEDENCE ICD: 853 Ohm
PACEART VT: 0
RV LEAD IMPEDENCE ICD: 490 Ohm
TZAT-0001FASTVT: 2
TZAT-0001SLOWVT: 2
TZAT-0013FASTVT: 1
TZAT-0013SLOWVT: 2
TZAT-0018FASTVT: NEGATIVE
TZAT-0018SLOWVT: NEGATIVE
TZON-0003SLOWVT: 315.8 ms
TZST-0001FASTVT: 3
TZST-0001FASTVT: 4
TZST-0001FASTVT: 7
TZST-0001FASTVT: 8
TZST-0001SLOWVT: 5
TZST-0001SLOWVT: 7
TZST-0003FASTVT: 41 J
TZST-0003FASTVT: 41 J
TZST-0003FASTVT: 41 J
TZST-0003FASTVT: 41 J
TZST-0003SLOWVT: 41 J
TZST-0003SLOWVT: 41 J
TZST-0003SLOWVT: 41 J
VF: 0

## 2013-01-29 ENCOUNTER — Encounter: Payer: Self-pay | Admitting: Internal Medicine

## 2013-03-17 ENCOUNTER — Ambulatory Visit (INDEPENDENT_AMBULATORY_CARE_PROVIDER_SITE_OTHER): Payer: Medicare Other | Admitting: Internal Medicine

## 2013-03-17 ENCOUNTER — Encounter: Payer: Self-pay | Admitting: Internal Medicine

## 2013-03-17 VITALS — BP 114/70 | HR 60 | Ht 76.0 in | Wt 239.8 lb

## 2013-03-17 DIAGNOSIS — I2589 Other forms of chronic ischemic heart disease: Secondary | ICD-10-CM

## 2013-03-17 DIAGNOSIS — Z95810 Presence of automatic (implantable) cardiac defibrillator: Secondary | ICD-10-CM

## 2013-03-17 DIAGNOSIS — I5022 Chronic systolic (congestive) heart failure: Secondary | ICD-10-CM

## 2013-03-17 DIAGNOSIS — I255 Ischemic cardiomyopathy: Secondary | ICD-10-CM

## 2013-03-17 LAB — ICD DEVICE OBSERVATION
AL AMPLITUDE: 6.3 mv
ATRIAL PACING ICD: 1 pct
DEVICE MODEL ICD: 139171
HV IMPEDENCE: 62 Ohm
LV LEAD THRESHOLD: 0.9 V
RV LEAD AMPLITUDE: 25 mv
RV LEAD IMPEDENCE ICD: 486 Ohm
TZAT-0001FASTVT: 1
TZAT-0013FASTVT: 1
TZAT-0013SLOWVT: 2
TZAT-0018FASTVT: NEGATIVE
TZON-0003SLOWVT: 315.8 ms
TZST-0001FASTVT: 3
TZST-0001FASTVT: 4
TZST-0001FASTVT: 7
TZST-0001SLOWVT: 3
TZST-0001SLOWVT: 6
TZST-0001SLOWVT: 7
TZST-0003FASTVT: 41 J
TZST-0003FASTVT: 41 J
TZST-0003FASTVT: 41 J
TZST-0003SLOWVT: 41 J
TZST-0003SLOWVT: 41 J
VENTRICULAR PACING ICD: 98 pct

## 2013-03-17 NOTE — Assessment & Plan Note (Addendum)
Stable  contiunue current symptoms  He is on Aldactone. Blood work apparently was checked by his PCP and he was (normal which I will infer   that his potassium level was also normal.

## 2013-03-17 NOTE — Assessment & Plan Note (Signed)
euvolemic  Continue current meds 

## 2013-03-17 NOTE — Assessment & Plan Note (Signed)
The patient's device was interrogated.  The information was reviewed. No changes were made in the programming.   Chest x-ray was reviewed. The LV lead is posterior not withstanding  ECG  which shows a left bundle branch block configuration

## 2013-03-17 NOTE — Patient Instructions (Addendum)
Your physician wants you to follow-up in: 1 year with Dr. Logan Bores will receive a reminder letter in the mail two months in advance. If you don't receive a letter, please call our office to schedule the follow-up appointment.  Remote monitoring is used to monitor your Pacemaker of ICD from home. This monitoring reduces the number of office visits required to check your device to one time per year. It allows [place] to keep an eye on the functioning of your device to ensure it is working properly. You are scheduled for a device check from home on 06/19/13. You may send your transmission at any time that day. If you have a wireless device, the transmission will be sent automatically. After your physician reviews your transmission, you will receive a postcard with your next transmission date.  Your physician recommends that you continue on your current medications as directed. Please refer to the Current Medication list given to you today.

## 2013-03-17 NOTE — Progress Notes (Signed)
Patient Care Team: Kari Baars, MD as PCP - General (Internal Medicine)   HPI  [name] is a 57 y.o. male Seen in followup for congestive heart failure in the setting ischemic cardiomyopathy. with history of congestive heart failure secondary to ischemic cardiomyopathy with an EF of 20%. He is status post CRT-D implantation and underwent device generator replacement July 2011.  He is status post multiple myocardial infarctions and previous stents. He has a biventricular ICD in place. He under derwent device generator replacement in July 2011.    Myoview 2011 which showed EF 17% with markedly dilated LV EDV: 509 ml ESV: 422 ml. Extensive scar with trivial peri-infarct ischemia. CPX 12/11 pVO2 24.5 slope 31.7 RER 1.12 O2 pulse 105% echocardiogram 5/14 demonstrated LVEF 20% with mild LAE RAE   Doing some better got his disability Stable symptoms  He is still a bookie   Past Medical History  Diagnosis Date  . CHF (congestive heart failure)     secondary to severe ischemic cardiomyopathy.   a.    EF 20% with akinesis of the distal half to two-thirds of the ventricle.  Trivial MR   b.     Status post bi-V ICD.   c.     Cardiopulmonary exercise test (12/11) pVO2 of 24.5,VE/VCO2 slope is 31,.7  . CAD (coronary artery disease)     multiple MIs  . Hyperlipidemia   . Left bundle branch block   . Diabetes mellitus   . ICD (implantable cardiac defibrillator) in place 10/2009    shocks due to sinus tac  . Diverticulosis   . Colon polyps   . Hemorrhoids   . Tinnitus   . Insomnia   . Prostate neoplasm     Past Surgical History  Procedure Laterality Date  . Single-chamber icd implantation  08/10/2003  . Dual chamber defibrillator  06/15/2004    Current Outpatient Prescriptions  Medication Sig Dispense Refill  . ACCU-CHEK AVIVA PLUS test strip       . aspirin 81 MG tablet Take 81 mg by mouth daily.        Marland Kitchen atorvastatin (LIPITOR) 20 MG tablet Take 20 mg by mouth daily.      .  bisoprolol (ZEBETA) 10 MG tablet Take 1 tablet (10 mg total) by mouth daily.  30 tablet  6  . furosemide (LASIX) 40 MG tablet 40 mg daily.       Marland Kitchen glimepiride (AMARYL) 4 MG tablet Take 4 mg by mouth daily.        . hydrocortisone (ANUSOL-HC) 2.5 % rectal cream Place 1 application rectally as needed.       . ramipril (ALTACE) 10 MG capsule Take 1 capsule (10 mg total) by mouth daily.  30 capsule  6  . spironolactone (ALDACTONE) 25 MG tablet TAKE ONE TABLET BY MOUTH EVERY DAY  30 tablet  5  . zolpidem (AMBIEN CR) 12.5 MG CR tablet Take 12.5 mg by mouth daily.        No current facility-administered medications for this visit.    No Known Allergies  Review of Systems negative except from HPI and PMH  Physical Exam BP 114/70  Pulse 60  Ht 6\' 4"  (1.93 m)  Wt 239 lb 12.8 oz (108.773 kg)  BMI 29.2 kg/m2 Well developed and well nourished in no acute distress HENT normal E scleral and icterus clear Neck Supple JVP flat; carotids brisk and full Clear to ausculation Device pocket well healed; without hematoma or erythema.  There is no tethering Regular rate and rhythm, no murmurs gallops or rub Soft with active bowel sounds No clubbing cyanosis none Edema Alert and oriented, grossly normal motor and sensory function Skin Warm and Dry    Assessment and  Plan

## 2013-03-26 ENCOUNTER — Encounter: Payer: Self-pay | Admitting: Internal Medicine

## 2013-04-09 ENCOUNTER — Other Ambulatory Visit: Payer: Self-pay | Admitting: Cardiology

## 2013-05-06 ENCOUNTER — Other Ambulatory Visit (HOSPITAL_COMMUNITY): Payer: Self-pay | Admitting: Internal Medicine

## 2013-05-09 ENCOUNTER — Other Ambulatory Visit: Payer: Self-pay | Admitting: Internal Medicine

## 2013-05-15 ENCOUNTER — Other Ambulatory Visit: Payer: Self-pay

## 2013-06-03 ENCOUNTER — Other Ambulatory Visit: Payer: Self-pay

## 2013-06-03 MED ORDER — SPIRONOLACTONE 25 MG PO TABS: ORAL_TABLET | ORAL | Status: DC

## 2013-06-19 ENCOUNTER — Ambulatory Visit (INDEPENDENT_AMBULATORY_CARE_PROVIDER_SITE_OTHER): Payer: Medicare Other | Admitting: *Deleted

## 2013-06-19 DIAGNOSIS — Z95810 Presence of automatic (implantable) cardiac defibrillator: Secondary | ICD-10-CM

## 2013-06-19 DIAGNOSIS — I5022 Chronic systolic (congestive) heart failure: Secondary | ICD-10-CM

## 2013-06-19 DIAGNOSIS — I2589 Other forms of chronic ischemic heart disease: Secondary | ICD-10-CM

## 2013-06-19 DIAGNOSIS — I255 Ischemic cardiomyopathy: Secondary | ICD-10-CM

## 2013-06-19 LAB — MDC_IDC_ENUM_SESS_TYPE_REMOTE
Lead Channel Impedance Value: 470 Ohm
Lead Channel Setting Pacing Amplitude: 2.4 V
Lead Channel Setting Pacing Pulse Width: 0.4 ms
Zone Setting Detection Interval: 250 ms
Zone Setting Detection Interval: 315.8 ms

## 2013-06-26 ENCOUNTER — Telehealth: Payer: Self-pay | Admitting: *Deleted

## 2013-06-26 ENCOUNTER — Encounter: Payer: Self-pay | Admitting: Internal Medicine

## 2013-06-26 NOTE — Telephone Encounter (Signed)
Called pt about ATP on 04/20/13. Pt has no recollection of an event. Pt informed of no driving x6 months.

## 2013-07-05 ENCOUNTER — Other Ambulatory Visit (HOSPITAL_COMMUNITY): Payer: Self-pay | Admitting: Internal Medicine

## 2013-07-23 ENCOUNTER — Encounter: Payer: Self-pay | Admitting: *Deleted

## 2013-08-01 ENCOUNTER — Telehealth: Payer: Self-pay | Admitting: *Deleted

## 2013-08-01 NOTE — Telephone Encounter (Signed)
Patient called stating that his copay for his bisoprolol is going from $6.00 to $45.00 a month. The letter that he got suggested that he change to atenolol or get an exception. Please advise. Thanks, MI

## 2013-08-02 NOTE — Telephone Encounter (Signed)
Please check with sally putt as atenolol is not an appropriate exception with cardiomyopathy thanks

## 2013-08-04 NOTE — Telephone Encounter (Signed)
Call insurance and initiated a tier exception.  PA#- [id number].  Pt is aware it may be a few days before we hear back from insurance.  If he does need the medication in the interim- suggested he just pay cash and that will be cheaper than a $45 co-pay

## 2013-08-12 MED ORDER — METOPROLOL SUCCINATE ER 100 MG PO TB24: 100.0000 mg | ORAL_TABLET | Freq: Every day | ORAL | Status: DC

## 2013-08-12 NOTE — Telephone Encounter (Signed)
Spoke with pt.  Received fax from insurance company that he must try carvedilol or metoprolol succinate first.  Pt just picked up this month's Rx for bisoprolol but would be willing to try metoprolol since once daily.  Will send in new Rx for him to try starting next month.

## 2013-08-12 NOTE — Addendum Note (Signed)
Addended by: Elberta Leatherwood R on: 08/12/2013 03:14 PM   Modules accepted: Orders, Medications

## 2013-09-18 ENCOUNTER — Ambulatory Visit (INDEPENDENT_AMBULATORY_CARE_PROVIDER_SITE_OTHER): Payer: Medicare Other | Admitting: *Deleted

## 2013-09-18 ENCOUNTER — Encounter: Payer: Self-pay | Admitting: Internal Medicine

## 2013-09-18 DIAGNOSIS — I2589 Other forms of chronic ischemic heart disease: Secondary | ICD-10-CM

## 2013-09-18 DIAGNOSIS — I255 Ischemic cardiomyopathy: Secondary | ICD-10-CM

## 2013-09-30 LAB — MDC_IDC_ENUM_SESS_TYPE_REMOTE
HighPow Impedance: 60 Ohm
Lead Channel Impedance Value: 459 Ohm
Lead Channel Impedance Value: 774 Ohm
Lead Channel Sensing Intrinsic Amplitude: 25 mV
Lead Channel Sensing Intrinsic Amplitude: 25 mV
Lead Channel Setting Pacing Amplitude: 2.4 V
Lead Channel Setting Pacing Amplitude: 2.4 V
Lead Channel Setting Pacing Pulse Width: 0.4 ms
MDC IDC MSMT LEADCHNL RA IMPEDANCE VALUE: 502 Ohm
MDC IDC MSMT LEADCHNL RA SENSING INTR AMPL: 5.8 mV
MDC IDC PG SERIAL: 139171
MDC IDC SET LEADCHNL RA PACING AMPLITUDE: 2 V
MDC IDC SET LEADCHNL RV PACING PULSEWIDTH: 0.4 ms
MDC IDC SET ZONE DETECTION INTERVAL: 285.7 ms
MDC IDC SET ZONE DETECTION INTERVAL: 315.8 ms
MDC IDC STAT BRADY RV PERCENT PACED: 96 %
Zone Setting Detection Interval: 250 ms

## 2013-11-12 ENCOUNTER — Ambulatory Visit (HOSPITAL_COMMUNITY)
Admission: RE | Admit: 2013-11-12 | Discharge: 2013-11-12 | Disposition: A | Discharge status: Home / Self Care | Payer: Medicare Other | Source: Ambulatory Visit | Attending: Internal Medicine | Admitting: Internal Medicine

## 2013-11-12 VITALS — BP 122/64 | HR 80 | Wt 244.5 lb

## 2013-11-12 DIAGNOSIS — I252 Old myocardial infarction: Secondary | ICD-10-CM | POA: Insufficient documentation

## 2013-11-12 DIAGNOSIS — E785 Hyperlipidemia, unspecified: Secondary | ICD-10-CM | POA: Insufficient documentation

## 2013-11-12 DIAGNOSIS — I5022 Chronic systolic (congestive) heart failure: Secondary | ICD-10-CM

## 2013-11-12 DIAGNOSIS — Z79899 Other long term (current) drug therapy: Secondary | ICD-10-CM | POA: Insufficient documentation

## 2013-11-12 DIAGNOSIS — I10 Essential (primary) hypertension: Secondary | ICD-10-CM | POA: Insufficient documentation

## 2013-11-12 DIAGNOSIS — Z7982 Long term (current) use of aspirin: Secondary | ICD-10-CM | POA: Insufficient documentation

## 2013-11-12 DIAGNOSIS — L0591 Pilonidal cyst without abscess: Secondary | ICD-10-CM | POA: Insufficient documentation

## 2013-11-12 DIAGNOSIS — Z9861 Coronary angioplasty status: Secondary | ICD-10-CM | POA: Insufficient documentation

## 2013-11-12 DIAGNOSIS — Z95810 Presence of automatic (implantable) cardiac defibrillator: Secondary | ICD-10-CM | POA: Insufficient documentation

## 2013-11-12 DIAGNOSIS — E119 Type 2 diabetes mellitus without complications: Secondary | ICD-10-CM | POA: Insufficient documentation

## 2013-11-12 DIAGNOSIS — E669 Obesity, unspecified: Secondary | ICD-10-CM | POA: Insufficient documentation

## 2013-11-12 DIAGNOSIS — I2510 Atherosclerotic heart disease of native coronary artery without angina pectoris: Secondary | ICD-10-CM | POA: Insufficient documentation

## 2013-11-12 DIAGNOSIS — D4959 Neoplasm of unspecified behavior of other genitourinary organ: Secondary | ICD-10-CM | POA: Insufficient documentation

## 2013-11-12 MED ORDER — DIGOXIN 125 MCG PO TABS: 0.1250 mg | ORAL_TABLET | Freq: Every day | ORAL | Status: DC

## 2013-11-12 NOTE — Patient Instructions (Signed)
Start Digoxin 0.125 mg daily  We will contact you in 1 year to schedule your next appointment and echocardiogram

## 2013-11-12 NOTE — Progress Notes (Signed)
Patient ID: Eamon Tantillo, male   DOB: 05-09-1956, 58 y.o.   MRN: [id number]  PCP: Dr Brigitte Pulse EP: Dr Caryl Comes  HPI: Jamarkus is a 58 year old male with history of chronic systolic heart failure EF 20% 2009, ICM s/p 2 myocardial infarctions and previous stents, [place] Sci BiVICD in place, S/P device generator replacement in July 2011, DM,  obesity, HTN, and a low HDL. Disabled since 2011.   Myoview 7/11 which showed EF 17% with markedly dilated LV  EDV: 509 ml ESV: 422 ml. Extensive scar with trivial peri-infarct ischemia.  CPX  12/11 pVO2 24.5 slope 31.7 RER 1.12 O2 pulse 105%   In April 2011 received 7 ICD shocks for SVT and now has PTSD from the event.Tried carvedilol in past, but did not tolerate due to diarrhea.  Echo 5/14: EF 20%. RV normal.   He returns for yearly follow up today.  Continues with exertional fatigue. Denies CP/PND/edema/Orthopnea. SOB after mild exertion. Weight at home 240-245 pounds. No ICD shocks.Compliant with medications.  Compliant low salt diet and fluid restriction. Follows with Dr. Lang Snow who follows lipids and DM2. Saw Dr. Caryl Comes about 6 months ago. Overall feels about the same or slightly worse since last year.   ROS: All systems negative except as listed in HPI, PMH and Problem List.  Past Medical History  Diagnosis Date  . CHF (congestive heart failure)     secondary to severe ischemic cardiomyopathy.   a.    EF 20% with akinesis of the distal half to two-thirds of the ventricle.  Trivial MR   b.     Status post bi-V ICD.   c.     Cardiopulmonary exercise test (12/11) pVO2 of 24.5,VE/VCO2 slope is 31,.7  . CAD (coronary artery disease)     multiple MIs  . Hyperlipidemia   . Left bundle branch block   . Diabetes mellitus   . ICD (implantable cardiac defibrillator) in place 10/2009    shocks due to sinus tac  . Diverticulosis   . Colon polyps   . Hemorrhoids   . Tinnitus   . Insomnia   . Prostate neoplasm     Current Outpatient Prescriptions  Medication  Sig Dispense Refill  . ACCU-CHEK AVIVA PLUS test strip       . aspirin 81 MG tablet Take 81 mg by mouth daily.        Marland Kitchen atorvastatin (LIPITOR) 20 MG tablet Take 20 mg by mouth daily.      Marland Kitchen esomeprazole (NEXIUM) 40 MG capsule Take 40 mg by mouth daily at 12 noon.      . furosemide (LASIX) 40 MG tablet TAKE ONE TABLET BY MOUTH      . glimepiride (AMARYL) 4 MG tablet Take 4 mg by mouth daily.        . hydrocortisone (ANUSOL-HC) 2.5 % rectal cream Place 1 application rectally as needed.       . metoprolol succinate (TOPROL-XL) 100 MG 24 hr tablet Take 1 tablet (100 mg total) by mouth daily. Take with or immediately following a meal.  30 tablet  6  . Probiotic Product (TRUBIOTICS PO) Take 1 capsule by mouth daily.      . ramipril (ALTACE) 10 MG capsule TAKE ONE CAPSULE BY MOUTH ONCE DAILY  30 capsule  6  . spironolactone (ALDACTONE) 25 MG tablet TAKE ONE TABLET BY MOUTH EVERY DAY  30 tablet  5  . zolpidem (AMBIEN CR) 12.5 MG CR tablet Take 12.5 mg by mouth daily.  No current facility-administered medications for this encounter.     PHYSICAL EXAM: Filed Vitals:   11/12/13 1146  BP: 122/64  Pulse: 80   General:  The patient was alert and oriented in no acute distress. HEENT Normal.  Neck veins were flat, carotids were brisk.  Lungs were clear.  Heart sounds were regular without murmurs or gallops.  Abdomen was soft with active bowel sounds. There is no clubbing cyanosis or edema. Skin Warm and dry Neuro: affect normal  cranial nerves intact.    ASSESSMENT & PLAN: 1. Chronic systolic HF due to iCM EF 20% 2. CAD 3. DM2 4. HL  Overall stable. Volume status looks good. Remains NYHA II-III. No evidence of ischemia. On good meds. Had problem with titrating b-blocker in past. Will continue current regimen with addition of digoxin 0.125 daily. Can consider repeat CPX in future if getting worse.  He is considering surgery for pilonidal cysts. I think he can tolerate surgery from a  cardiac standpoint. Does not need further cardiac testing at this point.    Shaune Pascal Eliel Dudding,MD 12:02 PM

## 2013-11-12 NOTE — Addendum Note (Signed)
Encounter addended by: Scarlette Calico, RN on: 11/12/2013 12:12 PM<BR>     Documentation filed: Patient Instructions Section, Orders

## 2013-12-02 ENCOUNTER — Ambulatory Visit (INDEPENDENT_AMBULATORY_CARE_PROVIDER_SITE_OTHER): Payer: Medicare Other | Admitting: General Surgery

## 2013-12-02 ENCOUNTER — Encounter (INDEPENDENT_AMBULATORY_CARE_PROVIDER_SITE_OTHER): Payer: Self-pay | Admitting: General Surgery

## 2013-12-02 VITALS — BP 132/80 | HR 80 | Temp 97.5°F | Ht 76.0 in | Wt 242.0 lb

## 2013-12-02 DIAGNOSIS — L988 Other specified disorders of the skin and subcutaneous tissue: Secondary | ICD-10-CM

## 2013-12-02 DIAGNOSIS — K644 Residual hemorrhoidal skin tags: Secondary | ICD-10-CM

## 2013-12-05 ENCOUNTER — Encounter: Payer: Self-pay | Admitting: Cardiology

## 2013-12-10 ENCOUNTER — Other Ambulatory Visit (HOSPITAL_COMMUNITY): Payer: Self-pay | Admitting: Internal Medicine

## 2013-12-16 ENCOUNTER — Other Ambulatory Visit (HOSPITAL_COMMUNITY): Payer: Self-pay | Admitting: *Deleted

## 2013-12-16 MED ORDER — SPIRONOLACTONE 25 MG PO TABS
ORAL_TABLET | ORAL | Status: DC
Start: 2013-12-16 — End: 2014-03-10

## 2013-12-17 NOTE — Progress Notes (Signed)
Subjective:     Patient ID: [name], male   DOB: 04-19-1956, 58 y.o.   MRN: [id number]  HPI 58 yom with significant cardiac history I have seen in past for internal hemorrhoidal disease that I have banded.  He was supposed to go see Dr Scarlette Shorts as he thought he was due for a colonoscopy.  His pilonidal disease is really not bothering him right now. The external tags are causing him some discomfort and hygiene issues.  Review of Systems     Objective:   Physical Exam Two small external tags at anus   midline pits at superior gluteal cleft Assessment:     Pilonidal disease External hemorrhoids    Plan:     I told him the pilonidal disease with his level of symptoms could best be treated with strip shaving.  He is hesitant to do this though. I think the external tags could be removed possibly just under local but I will check with Dr Henrene Pastor first as I would prefer him to get his scope.

## 2013-12-18 ENCOUNTER — Telehealth (INDEPENDENT_AMBULATORY_CARE_PROVIDER_SITE_OTHER): Payer: Self-pay

## 2013-12-18 NOTE — Telephone Encounter (Signed)
Message copied by Illene Regulus on Thu Dec 18, 2013  3:51 PM ------      Message from: Donne Hazel, MATTHEW      Created: Thu Dec 18, 2013 12:14 PM      Regarding: FW:       I think he should go get his colonoscopy first still.  Please let him know he should go see perry again and then we can take care of hemorrhoids      ----- Message -----         From: Irene Shipper, MD         Sent: 12/18/2013  11:48 AM           To: Rolm Bookbinder, MD            [facility],      His last colonoscopy was June 2008. He had a small adenoma. Followup recommended in 5 years. We sent him a recall letter in 2013 and again in 2014. He did not respond. He can feel free to contact the office to arrange for his colonoscopy. Thanks for checking.      [name]      ----- Message -----         From: Rolm Bookbinder, MD         Sent: 12/17/2013  10:27 PM           To: Irene Shipper, MD            [name]      I have been seeing him for hemorrhoids (has some external tags that bother him and internals I banded) and pilonidal disease.  He wants tags removed. He also thinks he is due a scope.  Could you check and see if that is the case?      Thanks,      [facility]       ------

## 2013-12-18 NOTE — Telephone Encounter (Signed)
LMOM for pt to call our office. I want to give him the message from Dr Donne Hazel that he did get in touch with Dr Henrene Pastor about the colonoscopy. Both physician's feel the pt really should get his colonoscopy first with Dr Henrene Pastor and then see Dr Donne Hazel for [place] to take care of the hemorrhoids. Dr Henrene Pastor said the pt could just call his office to schedule the colonoscopy. Pt can call us after the colonoscopy done.

## 2013-12-19 ENCOUNTER — Encounter: Payer: Self-pay | Admitting: Internal Medicine

## 2013-12-25 ENCOUNTER — Ambulatory Visit (INDEPENDENT_AMBULATORY_CARE_PROVIDER_SITE_OTHER): Payer: Medicare Other | Admitting: *Deleted

## 2013-12-25 ENCOUNTER — Encounter: Payer: Self-pay | Admitting: Internal Medicine

## 2013-12-25 DIAGNOSIS — I2589 Other forms of chronic ischemic heart disease: Secondary | ICD-10-CM

## 2013-12-25 DIAGNOSIS — Z95810 Presence of automatic (implantable) cardiac defibrillator: Secondary | ICD-10-CM

## 2013-12-25 DIAGNOSIS — I255 Ischemic cardiomyopathy: Secondary | ICD-10-CM

## 2013-12-25 LAB — MDC_IDC_ENUM_SESS_TYPE_REMOTE
Brady Statistic RA Percent Paced: 0 %
Brady Statistic RV Percent Paced: 98 %
HIGH POWER IMPEDANCE MEASURED VALUE: 64 Ohm
Implantable Pulse Generator Serial Number: 139171
Lead Channel Impedance Value: 527 Ohm
Lead Channel Setting Pacing Amplitude: 2 V
MDC IDC MSMT BATTERY REMAINING LONGEVITY: 84 mo
MDC IDC MSMT LEADCHNL LV IMPEDANCE VALUE: 839 Ohm
MDC IDC MSMT LEADCHNL RA SENSING INTR AMPL: 6.5 mV
MDC IDC MSMT LEADCHNL RV IMPEDANCE VALUE: 471 Ohm
MDC IDC SET LEADCHNL LV PACING AMPLITUDE: 2.4 V
MDC IDC SET LEADCHNL LV PACING PULSEWIDTH: 0.4 ms
MDC IDC SET LEADCHNL RV PACING AMPLITUDE: 2.4 V
MDC IDC SET LEADCHNL RV PACING PULSEWIDTH: 0.4 ms
MDC IDC SET ZONE DETECTION INTERVAL: 250 ms
Zone Setting Detection Interval: 285.7 ms
Zone Setting Detection Interval: 315.8 ms

## 2013-12-25 NOTE — Progress Notes (Signed)
Remote ICD transmission.   

## 2014-01-06 ENCOUNTER — Encounter: Payer: Self-pay | Admitting: Cardiology

## 2014-02-11 ENCOUNTER — Encounter: Payer: Medicare Other | Admitting: Internal Medicine

## 2014-02-13 ENCOUNTER — Other Ambulatory Visit: Payer: Self-pay | Admitting: *Deleted

## 2014-02-13 MED ORDER — RAMIPRIL 10 MG PO CAPS
ORAL_CAPSULE | ORAL | Status: DC
Start: 2014-02-13 — End: 2014-02-17

## 2014-02-17 ENCOUNTER — Other Ambulatory Visit: Payer: Self-pay | Admitting: *Deleted

## 2014-02-17 MED ORDER — RAMIPRIL 10 MG PO CAPS: ORAL_CAPSULE | ORAL | Status: DC

## 2014-03-10 ENCOUNTER — Encounter: Payer: Self-pay | Admitting: Internal Medicine

## 2014-03-10 ENCOUNTER — Encounter (HOSPITAL_COMMUNITY): Payer: Self-pay | Admitting: Pharmacy Technician

## 2014-03-10 ENCOUNTER — Encounter (HOSPITAL_COMMUNITY): Payer: Self-pay | Admitting: *Deleted

## 2014-03-10 ENCOUNTER — Ambulatory Visit (INDEPENDENT_AMBULATORY_CARE_PROVIDER_SITE_OTHER): Payer: Medicare Other | Admitting: Internal Medicine

## 2014-03-10 VITALS — BP 124/68 | HR 80 | Ht 76.0 in | Wt 246.0 lb

## 2014-03-10 DIAGNOSIS — I2510 Atherosclerotic heart disease of native coronary artery without angina pectoris: Secondary | ICD-10-CM

## 2014-03-10 DIAGNOSIS — E119 Type 2 diabetes mellitus without complications: Secondary | ICD-10-CM

## 2014-03-10 DIAGNOSIS — Z86010 Personal history of colonic polyps: Secondary | ICD-10-CM

## 2014-03-10 MED ORDER — MOVIPREP 100 G PO SOLR: 1.0000 | Freq: Once | ORAL | Status: DC

## 2014-03-10 NOTE — Progress Notes (Signed)
HISTORY OF PRESENT ILLNESS:  [name] is a 58 y.o. male with coronary artery disease status post multiple myocardial infarctions, congestive heart failure with severe ischemic cardiomyopathy (last ejection fraction 20%), status post ICD placement, diabetes mellitus, and adenomatous colon polyps. The patient presents today regarding surveillance colonoscopy. His last colonoscopy was performed 12/12/2006 at which time he was found to have a diminutive right-sided colon polyp which was removed and found to be a tubular adenoma. Followup in 5 years recommended. He was recently evaluated by his surgeon regarding pilonidal cyst. Before intervention, surveillance colonoscopy requested. Patient's GI review of systems is remarkable for chronic GERD which is completely managed with Nexium once daily. No other symptoms or complaints. His cardiac status has been stable.  REVIEW OF SYSTEMS:  All non-GI ROS negative except for exertional shortness of breath, insomnia, increased thirst, increased urination  Past Medical History  Diagnosis Date  . CHF (congestive heart failure)     secondary to severe ischemic cardiomyopathy.   a.    EF 20% with akinesis of the distal half to two-thirds of the ventricle.  Trivial MR   b.     Status post bi-V ICD.   c.     Cardiopulmonary exercise test (12/11) pVO2 of 24.5,VE/VCO2 slope is 31,.7  . CAD (coronary artery disease)     multiple MIs  . Hyperlipidemia   . Left bundle branch block   . Diabetes mellitus   . ICD (implantable cardiac defibrillator) in place 10/2009    shocks due to sinus tac  . Diverticulosis   . Colon polyps   . Hemorrhoids   . Tinnitus   . Insomnia   . Prostate neoplasm     Past Surgical History  Procedure Laterality Date  . Single-chamber icd implantation  08/10/2003  . Dual chamber defibrillator  06/15/2004    Social History [name]  reports that he quit smoking about 10 years ago. His smoking use included Cigarettes. He has a  60 pack-year smoking history. He does not have any smokeless tobacco history on file. He reports that he drinks alcohol. He reports that he uses illicit drugs.  family history includes Coronary artery disease in an other family member.  No Known Allergies     PHYSICAL EXAMINATION: Vital signs: BP 124/68  Pulse 80  Ht 6\' 4"  (1.93 m)  Wt 246 lb (111.585 kg)  BMI 29.96 kg/m2  Constitutional: generally well-appearing, no acute distress Psychiatric: alert and oriented x3, cooperative Eyes: extraocular movements intact, anicteric, conjunctiva pink Mouth: oral pharynx moist, no lesions Neck: supple no lymphadenopathy Cardiovascular: heart regular rate and rhythm, no murmur Lungs: clear to auscultation bilaterally Abdomen: soft, nontender, nondistended, no obvious ascites, no peritoneal signs, normal bowel sounds, no organomegaly Rectal: Deferred until colonoscopy Extremities: no lower extremity edema bilaterally Skin: no lesions on visible extremities Neuro: No focal deficits.   ASSESSMENT:  #1. History of diminutive tubular adenoma in June 2008. Due for surveillance colonoscopy #2. Multiple significant medical problems including congestive heart failure (ejection fraction 20% with ICD) and diabetes mellitus (on oral agent).   PLAN:  #1. Surveillance colonoscopy. Patient is high-risk given his comorbidities. Perform at the hospital with anesthesia monitored propofol.The nature of the procedure, as well as the risks, benefits, and alternatives were carefully and thoroughly reviewed with the patient. Ample time for discussion and questions allowed. The patient understood, was satisfied, and agreed to proceed. Movi prep prescribed. Patient instructed on its use #2. Hold oral diabetic medication the evening before  the exam (he takes his Amaryl at night).

## 2014-03-10 NOTE — Patient Instructions (Signed)
You have been scheduled for a colonoscopy at [facility].  Please follow written instructions given to you at your visit today.  Please pick up your prep kit at the pharmacy within the next 1-3 days. If you use inhalers (even only as needed), please bring them with you on the day of your procedure.  

## 2014-03-12 ENCOUNTER — Telehealth: Payer: Self-pay | Admitting: Internal Medicine

## 2014-03-12 NOTE — Telephone Encounter (Signed)
Pt had last colon in 2008 with adenomatous polyps. Pt should have had a recall done in 2013. Left message for pt to call back.  Pt called back and states that Insurance will not pay for his colon until 2018. Tried to explain to pt that he had adenomatous polyps last time and that his colon would not be coded as screening. Pt states he is on disability and cannot afford the colon, states he felt "bullied" by his surgeon into having this done. Pt wanted to make sure and apologize to Dr. Henrene Pastor for "wasting his time." Colon cancelled at [facility] per pt request and Dr. Henrene Pastor notified.

## 2014-03-12 NOTE — Telephone Encounter (Signed)
He understands the clinical issues. This is his decision. Thank you for the notification

## 2014-03-16 ENCOUNTER — Other Ambulatory Visit: Payer: Self-pay | Admitting: Internal Medicine

## 2014-03-19 ENCOUNTER — Ambulatory Visit (HOSPITAL_COMMUNITY): Admission: RE | Admit: 2014-03-19 | Payer: Medicare Other | Source: Ambulatory Visit | Admitting: Internal Medicine

## 2014-03-19 HISTORY — DX: Headache: R51

## 2014-03-19 HISTORY — DX: Gastro-esophageal reflux disease without esophagitis: K21.9

## 2014-03-19 SURGERY — COLONOSCOPY
Anesthesia: Monitor Anesthesia Care

## 2014-03-30 LAB — IFOBT (OCCULT BLOOD): IFOBT: NEGATIVE

## 2014-04-13 ENCOUNTER — Encounter: Payer: Self-pay | Admitting: Internal Medicine

## 2014-04-13 ENCOUNTER — Other Ambulatory Visit: Payer: Self-pay | Admitting: Internal Medicine

## 2014-04-13 ENCOUNTER — Ambulatory Visit (INDEPENDENT_AMBULATORY_CARE_PROVIDER_SITE_OTHER): Payer: Medicare Other | Admitting: Internal Medicine

## 2014-04-13 VITALS — BP 132/76 | HR 78 | Ht 76.0 in | Wt 246.6 lb

## 2014-04-13 DIAGNOSIS — I255 Ischemic cardiomyopathy: Secondary | ICD-10-CM

## 2014-04-13 DIAGNOSIS — Z95810 Presence of automatic (implantable) cardiac defibrillator: Secondary | ICD-10-CM

## 2014-04-13 DIAGNOSIS — I5022 Chronic systolic (congestive) heart failure: Secondary | ICD-10-CM

## 2014-04-13 LAB — MDC_IDC_ENUM_SESS_TYPE_INCLINIC
Battery Remaining Longevity: 7
HIGH POWER IMPEDANCE MEASURED VALUE: 47 Ohm
HighPow Impedance: 63 Ohm
Implantable Pulse Generator Serial Number: 139171
Lead Channel Impedance Value: 471 Ohm
Lead Channel Impedance Value: 504 Ohm
Lead Channel Pacing Threshold Amplitude: 0.6 V
Lead Channel Pacing Threshold Pulse Width: 0.4 ms
Lead Channel Sensing Intrinsic Amplitude: 24.4 mV
Lead Channel Sensing Intrinsic Amplitude: 25 mV
Lead Channel Sensing Intrinsic Amplitude: 6.1 mV
Lead Channel Setting Pacing Amplitude: 2.4 V
Lead Channel Setting Sensing Sensitivity: 1 mV
MDC IDC MSMT LEADCHNL LV IMPEDANCE VALUE: 762 Ohm
MDC IDC MSMT LEADCHNL LV PACING THRESHOLD AMPLITUDE: 1 V
MDC IDC MSMT LEADCHNL LV PACING THRESHOLD PULSEWIDTH: 0.4 ms
MDC IDC MSMT LEADCHNL RV PACING THRESHOLD AMPLITUDE: 0.8 V
MDC IDC MSMT LEADCHNL RV PACING THRESHOLD PULSEWIDTH: 0.4 ms
MDC IDC SESS DTM: 20151005040000
MDC IDC SET LEADCHNL LV PACING AMPLITUDE: 2.4 V
MDC IDC SET LEADCHNL LV PACING PULSEWIDTH: 0.4 ms
MDC IDC SET LEADCHNL RA PACING AMPLITUDE: 2 V
MDC IDC SET LEADCHNL RV PACING PULSEWIDTH: 0.4 ms
MDC IDC SET LEADCHNL RV SENSING SENSITIVITY: 0.5 mV
MDC IDC SET ZONE DETECTION INTERVAL: 250 ms
MDC IDC SET ZONE DETECTION INTERVAL: 316 ms
Zone Setting Detection Interval: 286 ms

## 2014-04-13 MED ORDER — NITROGLYCERIN 0.4 MG SL SUBL: 0.4000 mg | SUBLINGUAL_TABLET | SUBLINGUAL | Status: DC | PRN

## 2014-04-13 NOTE — Patient Instructions (Addendum)
A chest x-ray takes a picture of the organs and structures inside the chest, including the heart, lungs, and blood vessels. This test can show several things, including, whether the heart is enlarges; whether fluid is building up in the lungs; and whether pacemaker / defibrillator leads are still in place.  Remote monitoring is used to monitor your ICD from home. This monitoring reduces the number of office visits required to check your device to one time per year. It allows [place] to keep an eye on the functioning of your device to ensure it is working properly. You are scheduled for a device check from home on 07-14-2014. You may send your transmission at any time that day. If you have a wireless device, the transmission will be sent automatically. After your physician reviews your transmission, you will receive a postcard with your next transmission date.  Your physician wants you to follow-up in: 6 months with Dr. Haroldine Laws.  You will receive a reminder letter in the mail two months in advance. If you don't receive a letter, please call our office to schedule the follow-up appointment.  Your physician recommends that you schedule a follow-up appointment in: 12 months with Dr.Klein

## 2014-04-13 NOTE — Progress Notes (Signed)
Patient Care Team: Marton Redwood, MD as PCP - General (Internal Medicine)   HPI  [name] is a 58 y.o. male Seen in followup for congestive heart failure in the setting ischemic cardiomyopathy. with history of congestive heart failure secondary to ischemic cardiomyopathy with an EF of 20%. He is status post CRT-D implantation and underwent device generator replacement July 2011.  He is status post multiple myocardial infarctions and previous stents. He has a biventricular ICD in place. He under derwent device generator replacement in July 2011.    Myoview 2011 which showed EF 17% with markedly dilated LV EDV: 509 ml ESV: 422 ml. Extensive scar with trivial peri-infarct ischemia. CPX 12/11 pVO2 24.5 slope 31.7 RER 1.12 O2 pulse 105% echocardiogram 5/14 demonstrated LVEF 20% with mild LAE RAE   Doing some better got his disability Stable symptoms  He is still a bookie   Past Medical History  Diagnosis Date  . CHF (congestive heart failure)     secondary to severe ischemic cardiomyopathy.   a.    EF 20% with akinesis of the distal half to two-thirds of the ventricle.  Trivial MR   b.     Status post bi-V ICD.   c.     Cardiopulmonary exercise test (12/11) pVO2 of 24.5,VE/VCO2 slope is 31,.7  . Hyperlipidemia   . Left bundle branch block   . Diabetes mellitus   . ICD (implantable cardiac defibrillator) in place 10/2009    shocks due to sinus tac  . Diverticulosis   . Colon polyps   . Tinnitus     comes and goes  . Insomnia   . Prostate neoplasm   . CAD (coronary artery disease) last 2005    multiple MIs  . GERD (gastroesophageal reflux disease)   . Headache(784.0)   . Hemorrhoids     Past Surgical History  Procedure Laterality Date  . Single-chamber icd implantation  08/10/2003  . Dual chamber defibrillator  06/15/2004  . Hemorrhoid banding  09-2012  . Cardiac catheterization  2005    stent x 1 placed  . Tonsillectomy  age 84  . Colonscopy      x 2    Current  Outpatient Prescriptions  Medication Sig Dispense Refill  . aspirin 81 MG tablet Take 81 mg by mouth daily.        Marland Kitchen atorvastatin (LIPITOR) 40 MG tablet Take 40 mg by mouth daily.      Marland Kitchen esomeprazole (NEXIUM) 20 MG capsule Take 20 mg by mouth daily at 12 noon.      . furosemide (LASIX) 40 MG tablet Take 40 mg by mouth every morning.      Marland Kitchen glimepiride (AMARYL) 4 MG tablet Take 4 mg by mouth daily.        . metoprolol succinate (TOPROL-XL) 100 MG 24 hr tablet Take 100 mg by mouth every morning. Take with or immediately following a meal.      . metoprolol succinate (TOPROL-XL) 100 MG 24 hr tablet TAKE ONE TABLET BY MOUTH ONCE DAILY. TAKE WITH OR IMMEDIATELY FOLLOWING A MEAL  30 tablet  0  . MOVIPREP 100 G SOLR Take 1 kit (200 g total) by mouth once.  1 kit  0  . Probiotic Product (TRUBIOTICS PO) Take 1 capsule by mouth daily.      . ramipril (ALTACE) 10 MG capsule Take 10 mg by mouth every morning.      Marland Kitchen spironolactone (ALDACTONE) 25 MG tablet Take 25 mg by mouth every  morning.      . zolpidem (AMBIEN CR) 12.5 MG CR tablet Take 12.5 mg by mouth at bedtime.        No current facility-administered medications for this visit.    No Known Allergies  Review of Systems negative except from HPI and PMH  Physical Exam BP 132/76  Pulse 78  Ht '6\' 4"'  (1.93 m)  Wt 246 lb 9.6 oz (111.857 kg)  BMI 30.03 kg/m2 Well developed and well nourished in no acute distress HENT normal E scleral and icterus clear Neck Supple JVP flat; carotids brisk and full Clear to ausculation Device pocket well healed; without hematoma or erythema.  There is no tethering Regular rate and rhythm, no murmurs gallops or rub Soft with active bowel sounds No clubbing cyanosis none Edema Alert and oriented, grossly normal motor and sensory function Skin Warm and Dry  ECG demonstrates P. synchronous pacing with a QRS duration that was negative in lead V1  Assessment and  Plan  Ischemic heart myopathy  Congestive  heart failure-chronic-systolic  Implantable defibrillator-CRT  He is euvolemic. We'll continue him on his current medications.  I will need to look to see whether there was an AV optimization echo; I do not see one.  Chest x-ray was last in  2011  not withstanding the unusual ECG pattern sowed good posterolateral location  Comparison with ECG 2011 demonstrated that time there was a upright QRS in V1. This raises the possibility of lead dislodgment.  Have asked DB regarding entresto

## 2014-07-14 ENCOUNTER — Other Ambulatory Visit (HOSPITAL_COMMUNITY): Payer: Self-pay | Admitting: Internal Medicine

## 2014-07-14 ENCOUNTER — Ambulatory Visit (INDEPENDENT_AMBULATORY_CARE_PROVIDER_SITE_OTHER): Payer: Medicare Other | Admitting: *Deleted

## 2014-07-14 ENCOUNTER — Encounter: Payer: Self-pay | Admitting: Internal Medicine

## 2014-07-14 DIAGNOSIS — I255 Ischemic cardiomyopathy: Secondary | ICD-10-CM

## 2014-07-14 DIAGNOSIS — I5022 Chronic systolic (congestive) heart failure: Secondary | ICD-10-CM

## 2014-07-14 NOTE — Progress Notes (Signed)
Remote ICD transmission.   

## 2014-07-22 LAB — MDC_IDC_ENUM_SESS_TYPE_REMOTE
Battery Remaining Longevity: 78 mo
HIGH POWER IMPEDANCE MEASURED VALUE: 60 Ohm
Lead Channel Impedance Value: 776 Ohm
Lead Channel Pacing Threshold Amplitude: 0.6 V
Lead Channel Pacing Threshold Amplitude: 0.8 V
Lead Channel Pacing Threshold Pulse Width: 0.4 ms
Lead Channel Pacing Threshold Pulse Width: 0.4 ms
Lead Channel Setting Pacing Amplitude: 2 V
Lead Channel Setting Pacing Amplitude: 2.4 V
Lead Channel Setting Pacing Pulse Width: 0.4 ms
Lead Channel Setting Sensing Sensitivity: 0.5 mV
MDC IDC MSMT BATTERY REMAINING PERCENTAGE: 100 %
MDC IDC MSMT LEADCHNL LV PACING THRESHOLD AMPLITUDE: 1 V
MDC IDC MSMT LEADCHNL LV PACING THRESHOLD PULSEWIDTH: 0.4 ms
MDC IDC MSMT LEADCHNL RA IMPEDANCE VALUE: 501 Ohm
MDC IDC MSMT LEADCHNL RV IMPEDANCE VALUE: 446 Ohm
MDC IDC PG SERIAL: 139171
MDC IDC SESS DTM: 20160105055100
MDC IDC SET LEADCHNL LV SENSING SENSITIVITY: 1 mV
MDC IDC SET LEADCHNL RV PACING AMPLITUDE: 2.4 V
MDC IDC SET LEADCHNL RV PACING PULSEWIDTH: 0.4 ms
MDC IDC SET ZONE DETECTION INTERVAL: 250 ms
MDC IDC STAT BRADY RA PERCENT PACED: 0 %
MDC IDC STAT BRADY RV PERCENT PACED: 97 %
Zone Setting Detection Interval: 286 ms
Zone Setting Detection Interval: 316 ms

## 2014-08-11 ENCOUNTER — Other Ambulatory Visit: Payer: Self-pay | Admitting: Internal Medicine

## 2014-08-17 ENCOUNTER — Encounter: Payer: Self-pay | Admitting: Cardiology

## 2014-09-01 ENCOUNTER — Telehealth: Payer: Self-pay | Admitting: Internal Medicine

## 2014-09-01 NOTE — Telephone Encounter (Signed)
New message     Pt said he dropped a CD of his chest off to our office on 2-10.  Did Dr Caryl Comes get it and did he review it.  Dr Caryl Comes wanted pt to have a chest xray and it had it at another doctor's office

## 2014-09-01 NOTE — Telephone Encounter (Signed)
Informed patient we received CXR CD.  Will have Dr. Caryl Comes review when he returns next week.  Patient aware.

## 2014-10-19 ENCOUNTER — Ambulatory Visit (INDEPENDENT_AMBULATORY_CARE_PROVIDER_SITE_OTHER): Payer: Medicare Other | Admitting: *Deleted

## 2014-10-19 DIAGNOSIS — I255 Ischemic cardiomyopathy: Secondary | ICD-10-CM | POA: Diagnosis not present

## 2014-10-19 LAB — MDC_IDC_ENUM_SESS_TYPE_REMOTE
Battery Remaining Longevity: 78 mo
Brady Statistic RV Percent Paced: 98 %
Date Time Interrogation Session: 20160411045200
HighPow Impedance: 61 Ohm
Implantable Pulse Generator Serial Number: 139171
Lead Channel Impedance Value: 445 Ohm
Lead Channel Impedance Value: 495 Ohm
Lead Channel Impedance Value: 820 Ohm
Lead Channel Setting Pacing Amplitude: 2 V
Lead Channel Setting Pacing Amplitude: 2.4 V
Lead Channel Setting Pacing Amplitude: 2.4 V
Lead Channel Setting Pacing Pulse Width: 0.4 ms
Lead Channel Setting Pacing Pulse Width: 0.4 ms
MDC IDC MSMT BATTERY REMAINING PERCENTAGE: 100 %
MDC IDC SET LEADCHNL LV SENSING SENSITIVITY: 1 mV
MDC IDC SET LEADCHNL RV SENSING SENSITIVITY: 0.5 mV
MDC IDC SET ZONE DETECTION INTERVAL: 250 ms
MDC IDC SET ZONE DETECTION INTERVAL: 316 ms
MDC IDC STAT BRADY RA PERCENT PACED: 0 %
Zone Setting Detection Interval: 286 ms

## 2014-10-19 NOTE — Progress Notes (Signed)
Remote ICD transmission.   

## 2014-11-04 ENCOUNTER — Encounter (HOSPITAL_COMMUNITY): Payer: Self-pay

## 2014-11-04 ENCOUNTER — Ambulatory Visit (HOSPITAL_COMMUNITY)
Admission: RE | Admit: 2014-11-04 | Discharge: 2014-11-04 | Disposition: A | Discharge status: Home / Self Care | Payer: Medicare Other | Source: Ambulatory Visit | Attending: Internal Medicine | Admitting: Internal Medicine

## 2014-11-04 VITALS — BP 116/70 | HR 74 | Wt 245.0 lb

## 2014-11-04 DIAGNOSIS — Z87891 Personal history of nicotine dependence: Secondary | ICD-10-CM | POA: Insufficient documentation

## 2014-11-04 DIAGNOSIS — I2510 Atherosclerotic heart disease of native coronary artery without angina pectoris: Secondary | ICD-10-CM | POA: Insufficient documentation

## 2014-11-04 DIAGNOSIS — I5022 Chronic systolic (congestive) heart failure: Secondary | ICD-10-CM | POA: Diagnosis not present

## 2014-11-04 DIAGNOSIS — E785 Hyperlipidemia, unspecified: Secondary | ICD-10-CM | POA: Diagnosis not present

## 2014-11-04 DIAGNOSIS — E119 Type 2 diabetes mellitus without complications: Secondary | ICD-10-CM | POA: Insufficient documentation

## 2014-11-04 MED ORDER — ATORVASTATIN CALCIUM 40 MG PO TABS: 40.0000 mg | ORAL_TABLET | Freq: Every day | ORAL | Status: DC

## 2014-11-04 MED ORDER — SACUBITRIL-VALSARTAN 49-51 MG PO TABS: 1.0000 | ORAL_TABLET | Freq: Two times a day (BID) | ORAL | Status: DC

## 2014-11-04 NOTE — Patient Instructions (Addendum)
STOP Altace.  START Entresto 49/51 mg tablet twice daily.  Will schedule you for an echocardiogram.  Follow up 2 months.  Do the following things EVERYDAY: 1) Weigh yourself in the morning before breakfast. Write it down and keep it in a log. 2) Take your medicines as prescribed 3) Eat low salt foods-Limit salt (sodium) to 2000 mg per day.  4) Stay as active as you can everyday 5) Limit all fluids for the day to less than 2 liters

## 2014-11-04 NOTE — Addendum Note (Signed)
Encounter addended by: Effie Berkshire, RN on: 11/04/2014 11:46 AM<BR>     Documentation filed: Medications, Patient Instructions Section, Dx Association, Orders

## 2014-11-04 NOTE — Progress Notes (Signed)
Patient ID: [name], male   DOB: February 03, 1956, 59 y.o.   MRN: [id number]  PCP: Dr Brigitte Pulse EP: Dr Caryl Comes  HPI: [name] is a 59 year old male with history of chronic systolic heart failure EF 20% 2009, ICM s/p 2 myocardial infarctions and previous stents, [place] Sci BiVICD in place, S/P device generator replacement in July 2011, DM,  obesity, HTN, and a low HDL. Disabled since 2011.   Myoview 7/11 which showed EF 17% with markedly dilated LV  EDV: 509 ml ESV: 422 ml. Extensive scar with trivial peri-infarct ischemia.  CPX  12/11 pVO2 24.5 slope 31.7 RER 1.12 O2 pulse 105%   In April 2011 received 7 ICD shocks for SVT and now has PTSD from the event.Tried increasing carvedilol in past, but did not tolerate due to diarrhea.  Echo 5/14: EF 20%. RV normal.   He returns for yearly follow up today. Doing well.  Follows with Dr. Lang Snow who follows lipids and DM2. Last A1c is 6.4. Stopped taking atorvastatin completely because he was worried about side effects. Continues with exertional fatigue. Denies CP/PND/edema/Orthopnea. Weight at home 240-245 pounds. No ICD shocks. Compliant with medications.  Compliant low salt diet and fluid restriction.  Saw Dr. Caryl Comes about 6 months ago who suggested Entresto. Overall feels about the same or slightly worse since last year. Mother recently died from Alzheimer's. He is helping to take care of his father.   ROS: All systems negative except as listed in HPI, PMH and Problem List.  Past Medical History  Diagnosis Date  . CHF (congestive heart failure)     secondary to severe ischemic cardiomyopathy.   a.    EF 20% with akinesis of the distal half to two-thirds of the ventricle.  Trivial MR   b.     Status post bi-V ICD.   c.     Cardiopulmonary exercise test (12/11) pVO2 of 24.5,VE/VCO2 slope is 31,.7  . Hyperlipidemia   . Left bundle branch block   . Diabetes mellitus   . ICD (implantable cardiac defibrillator) in place 10/2009    shocks due to sinus tac  .  Diverticulosis   . Colon polyps   . Tinnitus     comes and goes  . Insomnia   . Prostate neoplasm   . CAD (coronary artery disease) last 2005    multiple MIs  . GERD (gastroesophageal reflux disease)   . Headache(784.0)   . Hemorrhoids     Current Outpatient Prescriptions  Medication Sig Dispense Refill  . aspirin 81 MG tablet Take 81 mg by mouth daily.      Marland Kitchen esomeprazole (NEXIUM) 20 MG capsule Take 20 mg by mouth daily at 12 noon.    . furosemide (LASIX) 40 MG tablet Take 40 mg by mouth every morning.    Marland Kitchen glimepiride (AMARYL) 4 MG tablet Take 4 mg by mouth daily.      . metoprolol succinate (TOPROL-XL) 100 MG 24 hr tablet TAKE ONE TABLET BY MOUTH ONCE DAILY. TAKE  WITH  OR  IMMEDIATELY  FOLLOWING  A  MEAL 30 tablet 6  . MOVIPREP 100 G SOLR Take 1 kit (200 g total) by mouth once. 1 kit 0  . nitroGLYCERIN (NITROSTAT) 0.4 MG SL tablet Place 1 tablet (0.4 mg total) under the tongue every 5 (five) minutes as needed for chest pain. 25 tablet 1  . Probiotic Product (TRUBIOTICS PO) Take 1 capsule by mouth daily.    . ramipril (ALTACE) 10 MG capsule TAKE ONE  CAPSULE BY MOUTH ONCE DAILY 30 capsule 6  . spironolactone (ALDACTONE) 25 MG tablet Take 25 mg by mouth every morning.    . zolpidem (AMBIEN CR) 12.5 MG CR tablet Take 12.5 mg by mouth at bedtime.      No current facility-administered medications for this encounter.     PHYSICAL EXAM: Filed Vitals:   11/04/14 1045  BP: 116/70  Pulse: 74   General:  The patient was alert and oriented in no acute distress. HEENT Normal.  Neck veins were flat, carotids were brisk.  Lungs were clear.  Heart sounds were regular without murmurs or gallops.  Abdomen was soft with active bowel sounds. There is no clubbing cyanosis or edema. Skin Warm and dry Neuro: affect normal  cranial nerves intact.   ECG: NSR 75 with v-pacing  ASSESSMENT & PLAN: 1. Chronic systolic HF due to iCM EF 20% 2. CAD 3. DM2 4. HL  Overall stable. Volume status  looks good. Remains NYHA II-III. No evidence of ischemia. On good meds. Had problem with titrating b-blocker in past. Have suggested repeat echo. He is not interested in ever repeating CPX. Will switch Altace to Entresto 100 bid. Will see back in 2 months to titrate. Resume atorvastatin 40 (refuses 80 mg daily).   [name],MD 11:15 AM

## 2014-11-05 NOTE — Addendum Note (Signed)
Encounter addended by: Yehuda Mao on: 11/05/2014  8:09 AM<BR>     Documentation filed: Charges VN

## 2014-11-10 ENCOUNTER — Other Ambulatory Visit (HOSPITAL_COMMUNITY): Payer: Self-pay | Admitting: *Deleted

## 2014-11-10 ENCOUNTER — Telehealth (HOSPITAL_COMMUNITY): Payer: Self-pay | Admitting: *Deleted

## 2014-11-10 NOTE — Telephone Encounter (Signed)
Faxed Entresto approval to pharmacy.  Approved through 11/05/2015.

## 2014-11-17 ENCOUNTER — Encounter: Payer: Self-pay | Admitting: Cardiology

## 2014-11-18 ENCOUNTER — Encounter: Payer: Self-pay | Admitting: Internal Medicine

## 2014-12-01 ENCOUNTER — Encounter: Payer: Self-pay | Admitting: Cardiology

## 2014-12-04 ENCOUNTER — Encounter: Payer: Self-pay | Admitting: Internal Medicine

## 2014-12-09 ENCOUNTER — Other Ambulatory Visit: Payer: Self-pay | Admitting: Internal Medicine

## 2014-12-09 DIAGNOSIS — Z139 Encounter for screening, unspecified: Secondary | ICD-10-CM

## 2015-01-04 ENCOUNTER — Other Ambulatory Visit: Payer: Self-pay

## 2015-01-19 ENCOUNTER — Ambulatory Visit (INDEPENDENT_AMBULATORY_CARE_PROVIDER_SITE_OTHER): Payer: Medicare Other | Admitting: *Deleted

## 2015-01-19 ENCOUNTER — Telehealth: Payer: Self-pay | Admitting: Cardiology

## 2015-01-19 DIAGNOSIS — I255 Ischemic cardiomyopathy: Secondary | ICD-10-CM | POA: Diagnosis not present

## 2015-01-19 NOTE — Telephone Encounter (Signed)
Attempted to confirm remote transmission with pt. No answer and was unable to leave a message.   

## 2015-01-20 NOTE — Progress Notes (Signed)
Remote ICD transmission.   

## 2015-01-28 LAB — CUP PACEART REMOTE DEVICE CHECK
Brady Statistic RA Percent Paced: 0 %
Date Time Interrogation Session: 20160712051600
HighPow Impedance: 62 Ohm
Lead Channel Pacing Threshold Pulse Width: 0.4 ms
Lead Channel Setting Pacing Amplitude: 2 V
Lead Channel Setting Pacing Amplitude: 2.4 V
Lead Channel Setting Pacing Pulse Width: 0.4 ms
Lead Channel Setting Pacing Pulse Width: 0.4 ms
MDC IDC MSMT BATTERY REMAINING LONGEVITY: 78 mo
MDC IDC MSMT BATTERY REMAINING PERCENTAGE: 100 %
MDC IDC MSMT LEADCHNL LV IMPEDANCE VALUE: 755 Ohm
MDC IDC MSMT LEADCHNL LV PACING THRESHOLD AMPLITUDE: 1 V
MDC IDC MSMT LEADCHNL RA IMPEDANCE VALUE: 506 Ohm
MDC IDC MSMT LEADCHNL RV IMPEDANCE VALUE: 446 Ohm
MDC IDC PG SERIAL: 139171
MDC IDC SET LEADCHNL LV SENSING SENSITIVITY: 1 mV
MDC IDC SET LEADCHNL RV PACING AMPLITUDE: 2.4 V
MDC IDC SET LEADCHNL RV SENSING SENSITIVITY: 0.5 mV
MDC IDC SET ZONE DETECTION INTERVAL: 316 ms
MDC IDC STAT BRADY RV PERCENT PACED: 98 %
Zone Setting Detection Interval: 250 ms
Zone Setting Detection Interval: 286 ms

## 2015-02-17 ENCOUNTER — Encounter: Payer: Self-pay | Admitting: Cardiology

## 2015-02-23 ENCOUNTER — Encounter: Payer: Self-pay | Admitting: Internal Medicine

## 2015-03-03 ENCOUNTER — Encounter: Payer: Self-pay | Admitting: Cardiology

## 2015-03-15 ENCOUNTER — Other Ambulatory Visit: Payer: Self-pay | Admitting: Internal Medicine

## 2015-04-13 ENCOUNTER — Other Ambulatory Visit: Payer: Self-pay | Admitting: Internal Medicine

## 2015-05-17 ENCOUNTER — Ambulatory Visit (INDEPENDENT_AMBULATORY_CARE_PROVIDER_SITE_OTHER): Payer: Medicare Other | Admitting: Internal Medicine

## 2015-05-17 ENCOUNTER — Encounter: Payer: Self-pay | Admitting: Internal Medicine

## 2015-05-17 VITALS — BP 130/80 | HR 70 | Ht 76.0 in | Wt 264.0 lb

## 2015-05-17 DIAGNOSIS — I255 Ischemic cardiomyopathy: Secondary | ICD-10-CM

## 2015-05-17 DIAGNOSIS — I5022 Chronic systolic (congestive) heart failure: Secondary | ICD-10-CM | POA: Diagnosis not present

## 2015-05-17 DIAGNOSIS — Z95810 Presence of automatic (implantable) cardiac defibrillator: Secondary | ICD-10-CM

## 2015-05-17 NOTE — Progress Notes (Signed)
Patient Care Team: Marton Redwood, MD as PCP - General (Internal Medicine)   HPI  [name] is a 59 y.o. male Seen in followup for congestive heart failure in the setting ischemic cardiomyopathy. with history of congestive heart failure secondary to ischemic cardiomyopathy with an EF of 20%. He is status post CRT-D implantation and underwent device generator replacement July 2011.  He is status post multiple myocardial infarctions and previous stents. He has a biventricular ICD in place. He under derwent device generator replacement in July 2011.  Chronically does not feel well but no complaints of chest pain and shortness of breath or edema apart from the baselein     Myoview 2011 which showed EF 17% with markedly dilated LV EDV: 509 ml ESV: 422 ml. Extensive scar with trivial peri-infarct ischemia. CPX 12/11 pVO2 24.5 slope 31.7 RER 1.12 O2 pulse 105% echocardiogram 5/14 demonstrated LVEF 20% with mild LAE RAE     Stable symptoms  He is still a bookie   Past Medical History  Diagnosis Date  . CHF (congestive heart failure) (HCC)     secondary to severe ischemic cardiomyopathy.   a.    EF 20% with akinesis of the distal half to two-thirds of the ventricle.  Trivial MR   b.     Status post bi-V ICD.   c.     Cardiopulmonary exercise test (12/11) pVO2 of 24.5,VE/VCO2 slope is 31,.7  . Hyperlipidemia   . Left bundle branch block   . Diabetes mellitus   . ICD (implantable cardiac defibrillator) in place 10/2009    shocks due to sinus tac  . Diverticulosis   . Colon polyps   . Tinnitus     comes and goes  . Insomnia   . Prostate neoplasm   . CAD (coronary artery disease) last 2005    multiple MIs  . GERD (gastroesophageal reflux disease)   . Headache(784.0)   . Hemorrhoids     Past Surgical History  Procedure Laterality Date  . Single-chamber icd implantation  08/10/2003  . Dual chamber defibrillator  06/15/2004  . Hemorrhoid banding  09-2012  . Cardiac catheterization   2005    stent x 1 placed  . Tonsillectomy  age 8  . Colonscopy      x 2    Current Outpatient Prescriptions  Medication Sig Dispense Refill  . aspirin 81 MG tablet Take 81 mg by mouth daily.      Marland Kitchen esomeprazole (NEXIUM) 20 MG capsule Take 20 mg by mouth daily at 12 noon.    . furosemide (LASIX) 40 MG tablet Take 40 mg by mouth every morning.    Marland Kitchen glimepiride (AMARYL) 4 MG tablet Take 4 mg by mouth daily.      . metoprolol succinate (TOPROL-XL) 100 MG 24 hr tablet TAKE ONE TABLET BY MOUTH ONCE DAILY. TAKE  WITH  OR  IMMEDIATELY  FOLLOWING  A  MEAL. 30 tablet 1  . nitroGLYCERIN (NITROSTAT) 0.4 MG SL tablet Place 1 tablet (0.4 mg total) under the tongue every 5 (five) minutes as needed for chest pain. 25 tablet 1  . ramipril (ALTACE) 10 MG capsule Take 10 mg by mouth daily.    Marland Kitchen spironolactone (ALDACTONE) 25 MG tablet Take 25 mg by mouth every morning.    . zolpidem (AMBIEN CR) 12.5 MG CR tablet Take 12.5 mg by mouth at bedtime.      No current facility-administered medications for this visit.    No Known Allergies  Review  of Systems negative except from HPI and PMH  Physical Exam BP 130/80 mmHg  Pulse 70  Ht 6\' 4"  (1.93 m)  Wt 264 lb (119.75 kg)  BMI 32.15 kg/m2 Well developed and well nourished in no acute distress HENT normal E scleral and icterus clear Neck Supple JVP flat; carotids brisk and full Clear to ausculation Device pocket well healed; without hematoma or erythema.  There is no tethering Regular rate and rhythm, no murmurs gallops or rub Soft with active bowel sounds No clubbing cyanosis none Edema Alert and oriented, grossly normal motor and sensory function Skin Warm and Dry  ECG demonstrates P. synchronous pacing with a QRS duration that was negative in lead V1  Assessment and  Plan  Ischemic heart myopathy  Congestive heart failure-chronic-systolic  Euvolemic continue current meds  Implantable defibrillator-CRT  The patient's device was interrogated.   The information was reviewed. No changes were made in the programming.      Blood work was checked by PCP and kidney function potassium function were normal in May.  We'll continue his current guidelines directed medical therapy.

## 2015-05-17 NOTE — Patient Instructions (Signed)
Medication Instructions: - no changes  Labwork: - none  Procedures/Testing: - none  Follow-Up: - Remote monitoring is used to monitor your Pacemaker of ICD from home. This monitoring reduces the number of office visits required to check your device to one time per year. It allows [place] to keep an eye on the functioning of your device to ensure it is working properly. You are scheduled for a device check from home on 08/16/15. You may send your transmission at any time that day. If you have a wireless device, the transmission will be sent automatically. After your physician reviews your transmission, you will receive a postcard with your next transmission date.  - Your physician wants you to follow-up in: 1 year with Dr. Caryl Comes. You will receive a reminder letter in the mail two months in advance. If you don't receive a letter, please call our office to schedule the follow-up appointment.  Any Additional Special Instructions Will Be Listed Below (If Applicable). - none

## 2015-05-20 LAB — CUP PACEART REMOTE DEVICE CHECK
Battery Remaining Longevity: 72 mo
Battery Remaining Percentage: 100 %
Brady Statistic RA Percent Paced: 0 %
Brady Statistic RV Percent Paced: 96 %
HIGH POWER IMPEDANCE MEASURED VALUE: 63 Ohm
Implantable Lead Implant Date: 20051207
Implantable Lead Location: 753858
Implantable Lead Location: 753859
Implantable Lead Location: 753860
Implantable Lead Serial Number: 148938
Lead Channel Impedance Value: 445 Ohm
Lead Channel Impedance Value: 505 Ohm
Lead Channel Impedance Value: 865 Ohm
Lead Channel Pacing Threshold Amplitude: 0.6 V
Lead Channel Pacing Threshold Amplitude: 1 V
Lead Channel Pacing Threshold Pulse Width: 0.4 ms
Lead Channel Pacing Threshold Pulse Width: 0.4 ms
Lead Channel Setting Pacing Amplitude: 2 V
Lead Channel Setting Pacing Pulse Width: 0.4 ms
Lead Channel Setting Pacing Pulse Width: 0.4 ms
Lead Channel Setting Sensing Sensitivity: 0.5 mV
Lead Channel Setting Sensing Sensitivity: 1 mV
MDC IDC LEAD IMPLANT DT: 20051207
MDC IDC LEAD IMPLANT DT: 20110721
MDC IDC LEAD SERIAL: 205859
MDC IDC MSMT LEADCHNL RV PACING THRESHOLD AMPLITUDE: 0.8 V
MDC IDC MSMT LEADCHNL RV PACING THRESHOLD PULSEWIDTH: 0.4 ms
MDC IDC SESS DTM: 20161107184400
MDC IDC SET LEADCHNL LV PACING AMPLITUDE: 2.4 V
MDC IDC SET LEADCHNL RV PACING AMPLITUDE: 2.4 V
Pulse Gen Serial Number: 139171

## 2015-06-09 ENCOUNTER — Other Ambulatory Visit: Payer: Self-pay | Admitting: Internal Medicine

## 2015-07-05 ENCOUNTER — Other Ambulatory Visit (HOSPITAL_COMMUNITY): Payer: Self-pay | Admitting: Internal Medicine

## 2015-07-13 ENCOUNTER — Other Ambulatory Visit (HOSPITAL_COMMUNITY): Payer: Self-pay | Admitting: *Deleted

## 2015-07-13 MED ORDER — SPIRONOLACTONE 25 MG PO TABS: 25.0000 mg | ORAL_TABLET | Freq: Every morning | ORAL | Status: DC

## 2015-07-13 MED ORDER — FUROSEMIDE 40 MG PO TABS: 40.0000 mg | ORAL_TABLET | Freq: Every morning | ORAL | Status: DC

## 2015-08-12 ENCOUNTER — Other Ambulatory Visit: Payer: Self-pay | Admitting: Internal Medicine

## 2015-08-13 NOTE — Addendum Note (Signed)
Addended by: Derl Barrow on: 08/13/2015 02:18 PM   Modules accepted: Orders, Medications

## 2015-08-16 ENCOUNTER — Telehealth: Payer: Self-pay | Admitting: Cardiology

## 2015-08-16 ENCOUNTER — Ambulatory Visit (INDEPENDENT_AMBULATORY_CARE_PROVIDER_SITE_OTHER): Payer: Medicare Other | Admitting: *Deleted

## 2015-08-16 DIAGNOSIS — I255 Ischemic cardiomyopathy: Secondary | ICD-10-CM

## 2015-08-16 NOTE — Telephone Encounter (Signed)
Spoke with pt and reminded pt of remote transmission that is due today. Pt verbalized understanding.   

## 2015-08-17 NOTE — Progress Notes (Signed)
Remote ICD transmission.   

## 2015-09-03 ENCOUNTER — Encounter: Payer: Self-pay | Admitting: Gastroenterology

## 2015-09-11 LAB — CUP PACEART REMOTE DEVICE CHECK
Brady Statistic RA Percent Paced: 0 %
HighPow Impedance: 65 Ohm
Implantable Lead Implant Date: 20051207
Implantable Lead Implant Date: 20051207
Implantable Lead Implant Date: 20110721
Implantable Lead Location: 753859
Implantable Lead Location: 753860
Implantable Lead Model: 5076
Implantable Lead Serial Number: 148938
Implantable Lead Serial Number: 205859
Lead Channel Impedance Value: 460 Ohm
Lead Channel Impedance Value: 525 Ohm
Lead Channel Impedance Value: 884 Ohm
Lead Channel Pacing Threshold Amplitude: 0.8 V
Lead Channel Pacing Threshold Pulse Width: 0.4 ms
Lead Channel Setting Pacing Amplitude: 2.4 V
Lead Channel Setting Pacing Amplitude: 2.4 V
Lead Channel Setting Pacing Pulse Width: 0.4 ms
Lead Channel Setting Pacing Pulse Width: 0.4 ms
MDC IDC LEAD LOCATION: 753858
MDC IDC MSMT BATTERY REMAINING LONGEVITY: 66 mo
MDC IDC MSMT BATTERY REMAINING PERCENTAGE: 100 %
MDC IDC MSMT LEADCHNL LV PACING THRESHOLD AMPLITUDE: 1 V
MDC IDC MSMT LEADCHNL LV PACING THRESHOLD PULSEWIDTH: 0.4 ms
MDC IDC MSMT LEADCHNL RA PACING THRESHOLD AMPLITUDE: 0.6 V
MDC IDC MSMT LEADCHNL RA PACING THRESHOLD PULSEWIDTH: 0.4 ms
MDC IDC PG SERIAL: 139171
MDC IDC SESS DTM: 20170207135400
MDC IDC SET LEADCHNL LV SENSING SENSITIVITY: 1 mV
MDC IDC SET LEADCHNL RA PACING AMPLITUDE: 2 V
MDC IDC SET LEADCHNL RV SENSING SENSITIVITY: 0.5 mV
MDC IDC STAT BRADY RV PERCENT PACED: 98 %

## 2015-09-11 NOTE — Progress Notes (Signed)
Normal remote reviewed.  Next Lat[employer] 11/15/15 

## 2015-09-12 ENCOUNTER — Encounter: Payer: Self-pay | Admitting: Cardiology

## 2015-09-29 ENCOUNTER — Encounter: Payer: Self-pay | Admitting: Cardiology

## 2015-11-10 ENCOUNTER — Other Ambulatory Visit (HOSPITAL_COMMUNITY): Payer: Self-pay | Admitting: Internal Medicine

## 2015-11-15 ENCOUNTER — Ambulatory Visit (INDEPENDENT_AMBULATORY_CARE_PROVIDER_SITE_OTHER): Payer: Medicare Other | Admitting: *Deleted

## 2015-11-15 DIAGNOSIS — Z95810 Presence of automatic (implantable) cardiac defibrillator: Secondary | ICD-10-CM | POA: Diagnosis not present

## 2015-11-15 DIAGNOSIS — I255 Ischemic cardiomyopathy: Secondary | ICD-10-CM | POA: Diagnosis not present

## 2015-11-16 NOTE — Progress Notes (Signed)
Remote ICD transmission.   

## 2015-12-22 ENCOUNTER — Encounter: Payer: Self-pay | Admitting: Cardiology

## 2015-12-23 LAB — CUP PACEART REMOTE DEVICE CHECK
Battery Remaining Longevity: 66 mo
Battery Remaining Percentage: 100 %
Brady Statistic RA Percent Paced: 0 %
HighPow Impedance: 68 Ohm
Implantable Lead Implant Date: 20051207
Implantable Lead Implant Date: 20051207
Implantable Lead Implant Date: 20110721
Implantable Lead Location: 753859
Implantable Lead Location: 753860
Implantable Lead Model: 158
Implantable Lead Model: 4543
Implantable Lead Serial Number: 148938
Implantable Lead Serial Number: 205859
Lead Channel Impedance Value: 544 Ohm
Lead Channel Impedance Value: 909 Ohm
Lead Channel Pacing Threshold Amplitude: 0.8 V
Lead Channel Pacing Threshold Amplitude: 1 V
Lead Channel Pacing Threshold Pulse Width: 0.4 ms
Lead Channel Setting Pacing Amplitude: 2.4 V
Lead Channel Setting Sensing Sensitivity: 1 mV
MDC IDC LEAD LOCATION: 753858
MDC IDC MSMT LEADCHNL RA PACING THRESHOLD AMPLITUDE: 0.6 V
MDC IDC MSMT LEADCHNL RA PACING THRESHOLD PULSEWIDTH: 0.4 ms
MDC IDC MSMT LEADCHNL RV IMPEDANCE VALUE: 476 Ohm
MDC IDC MSMT LEADCHNL RV PACING THRESHOLD PULSEWIDTH: 0.4 ms
MDC IDC PG SERIAL: 139171
MDC IDC SESS DTM: 20170502151800
MDC IDC SET LEADCHNL LV PACING PULSEWIDTH: 0.4 ms
MDC IDC SET LEADCHNL RA PACING AMPLITUDE: 2 V
MDC IDC SET LEADCHNL RV PACING AMPLITUDE: 2.4 V
MDC IDC SET LEADCHNL RV PACING PULSEWIDTH: 0.4 ms
MDC IDC SET LEADCHNL RV SENSING SENSITIVITY: 0.5 mV
MDC IDC STAT BRADY RV PERCENT PACED: 98 %

## 2016-01-05 ENCOUNTER — Encounter: Payer: Self-pay | Admitting: Cardiology

## 2016-01-29 ENCOUNTER — Other Ambulatory Visit: Payer: Self-pay | Admitting: Internal Medicine

## 2016-01-29 DIAGNOSIS — F17211 Nicotine dependence, cigarettes, in remission: Secondary | ICD-10-CM

## 2016-02-14 ENCOUNTER — Ambulatory Visit (INDEPENDENT_AMBULATORY_CARE_PROVIDER_SITE_OTHER): Payer: Medicare Other | Admitting: *Deleted

## 2016-02-14 DIAGNOSIS — I255 Ischemic cardiomyopathy: Secondary | ICD-10-CM | POA: Diagnosis not present

## 2016-02-14 DIAGNOSIS — Z95810 Presence of automatic (implantable) cardiac defibrillator: Secondary | ICD-10-CM

## 2016-02-15 NOTE — Progress Notes (Signed)
Remote ICD transmission.   

## 2016-02-16 ENCOUNTER — Encounter: Payer: Self-pay | Admitting: Cardiology

## 2016-02-22 LAB — CUP PACEART REMOTE DEVICE CHECK
HighPow Impedance: 67 Ohm
Implantable Lead Implant Date: 20051207
Implantable Lead Location: 753858
Implantable Lead Model: 158
Implantable Lead Model: 4543
Implantable Lead Serial Number: 205859
Lead Channel Impedance Value: 532 Ohm
Lead Channel Pacing Threshold Amplitude: 1 V
Lead Channel Pacing Threshold Pulse Width: 0.4 ms
Lead Channel Setting Pacing Amplitude: 2 V
Lead Channel Setting Sensing Sensitivity: 0.5 mV
Lead Channel Setting Sensing Sensitivity: 1 mV
MDC IDC LEAD IMPLANT DT: 20051207
MDC IDC LEAD IMPLANT DT: 20110721
MDC IDC LEAD LOCATION: 753859
MDC IDC LEAD LOCATION: 753860
MDC IDC LEAD SERIAL: 148938
MDC IDC MSMT BATTERY REMAINING LONGEVITY: 60 mo
MDC IDC MSMT BATTERY REMAINING PERCENTAGE: 95 %
MDC IDC MSMT LEADCHNL LV IMPEDANCE VALUE: 871 Ohm
MDC IDC MSMT LEADCHNL RV IMPEDANCE VALUE: 476 Ohm
MDC IDC SESS DTM: 20170808150400
MDC IDC SET LEADCHNL LV PACING AMPLITUDE: 2.4 V
MDC IDC SET LEADCHNL LV PACING PULSEWIDTH: 0.4 ms
MDC IDC SET LEADCHNL RV PACING AMPLITUDE: 2.4 V
MDC IDC SET LEADCHNL RV PACING PULSEWIDTH: 0.4 ms
MDC IDC STAT BRADY RA PERCENT PACED: 0 %
MDC IDC STAT BRADY RV PERCENT PACED: 98 %
Pulse Gen Serial Number: 139171

## 2016-03-03 ENCOUNTER — Encounter: Payer: Self-pay | Admitting: Cardiology

## 2016-03-07 ENCOUNTER — Other Ambulatory Visit (HOSPITAL_COMMUNITY): Payer: Self-pay | Admitting: Cardiology

## 2016-03-20 ENCOUNTER — Ambulatory Visit (HOSPITAL_COMMUNITY)
Admission: RE | Admit: 2016-03-20 | Discharge: 2016-03-20 | Disposition: A | Discharge status: Home / Self Care | Payer: Medicare Other | Source: Ambulatory Visit | Attending: Internal Medicine | Admitting: Internal Medicine

## 2016-03-20 ENCOUNTER — Encounter (HOSPITAL_COMMUNITY): Payer: Self-pay | Admitting: Internal Medicine

## 2016-03-20 VITALS — BP 126/72 | HR 79 | Wt 239.0 lb

## 2016-03-20 DIAGNOSIS — I2510 Atherosclerotic heart disease of native coronary artery without angina pectoris: Secondary | ICD-10-CM | POA: Diagnosis not present

## 2016-03-20 DIAGNOSIS — I255 Ischemic cardiomyopathy: Secondary | ICD-10-CM | POA: Insufficient documentation

## 2016-03-20 DIAGNOSIS — Z7982 Long term (current) use of aspirin: Secondary | ICD-10-CM | POA: Insufficient documentation

## 2016-03-20 DIAGNOSIS — H9319 Tinnitus, unspecified ear: Secondary | ICD-10-CM | POA: Insufficient documentation

## 2016-03-20 DIAGNOSIS — K219 Gastro-esophageal reflux disease without esophagitis: Secondary | ICD-10-CM | POA: Diagnosis not present

## 2016-03-20 DIAGNOSIS — Z955 Presence of coronary angioplasty implant and graft: Secondary | ICD-10-CM | POA: Diagnosis not present

## 2016-03-20 DIAGNOSIS — I110 Hypertensive heart disease with heart failure: Secondary | ICD-10-CM | POA: Diagnosis not present

## 2016-03-20 DIAGNOSIS — E669 Obesity, unspecified: Secondary | ICD-10-CM | POA: Diagnosis not present

## 2016-03-20 DIAGNOSIS — R51 Headache: Secondary | ICD-10-CM | POA: Diagnosis not present

## 2016-03-20 DIAGNOSIS — I252 Old myocardial infarction: Secondary | ICD-10-CM | POA: Diagnosis not present

## 2016-03-20 DIAGNOSIS — I5022 Chronic systolic (congestive) heart failure: Secondary | ICD-10-CM | POA: Insufficient documentation

## 2016-03-20 DIAGNOSIS — F4310 Post-traumatic stress disorder, unspecified: Secondary | ICD-10-CM | POA: Diagnosis not present

## 2016-03-20 DIAGNOSIS — Z86010 Personal history of colonic polyps: Secondary | ICD-10-CM | POA: Diagnosis not present

## 2016-03-20 DIAGNOSIS — I447 Left bundle-branch block, unspecified: Secondary | ICD-10-CM | POA: Insufficient documentation

## 2016-03-20 DIAGNOSIS — Z95810 Presence of automatic (implantable) cardiac defibrillator: Secondary | ICD-10-CM | POA: Diagnosis not present

## 2016-03-20 DIAGNOSIS — E785 Hyperlipidemia, unspecified: Secondary | ICD-10-CM | POA: Diagnosis not present

## 2016-03-20 DIAGNOSIS — E119 Type 2 diabetes mellitus without complications: Secondary | ICD-10-CM | POA: Insufficient documentation

## 2016-03-20 NOTE — Progress Notes (Signed)
Patient ID: [name], male   DOB: Apr 04, 1956, 60 y.o.   MRN: [id number]   [facility] CLINIC NOTE  PCP: Dr Brigitte Pulse EP: Dr Caryl Comes Cardiologist: Dr Haroldine Laws  HPI: [name] is a 60 year old male with history of chronic systolic heart failure EF 20% 2009, ICM s/p 2 myocardial infarctions and previous stents, [place] [employer] BiVICD in place, S/P device generator replacement in July 2011, DM,  obesity, HTN, and a low HDL. Disabled since 2011.   Myoview 7/11 which showed EF 17% with markedly dilated LV  EDV: 509 ml ESV: 422 ml. Extensive scar with trivial peri-infarct ischemia.  CPX  12/11 pVO2 24.5 slope 31.7 RER 1.12 O2 pulse 105%   In April 2011 received 7 ICD shocks for SVT and now has PTSD from the event.Tried increasing carvedilol in past, but did not tolerate due to diarrhea.   Echo 5/14: EF 20%. RV normal.   He returns for yearly follow up today. Last visit he was switched to entresto however he deveoped elbow pain so he went back on ramipril and felt better. Overall feeling ok. SOB walking 1000 feet. No change from last year. Denies PND/Orthopnea. No CP.  Drinks whiskey 8 ounces daily. Smokes a cigar nightly. Taking all medications. Lives alone.   ROS: All systems negative except as listed in HPI, PMH and Problem List.  Past Medical History:  Diagnosis Date  . CAD (coronary artery disease) last 2005   multiple MIs  . CHF (congestive heart failure) (HCC)    secondary to severe ischemic cardiomyopathy.   a.    EF 20% with akinesis of the distal half to two-thirds of the ventricle.  Trivial MR   b.     Status post bi-V ICD.   c.     Cardiopulmonary exercise test (12/11) pVO2 of 24.5,VE/VCO2 slope is 31,.7  . Colon polyps   . Diabetes mellitus   . Diverticulosis   . GERD (gastroesophageal reflux disease)   . Headache(784.0)   . Hemorrhoids   . Hyperlipidemia   . 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 Insomnia   . Left bundle branch block   .  Prostate neoplasm   . Tinnitus    comes and goes    Current Outpatient Prescriptions  Medication Sig Dispense Refill  . aspirin 81 MG tablet Take 81 mg by mouth daily.      Marland Kitchen esomeprazole (NEXIUM) 20 MG capsule Take 20 mg by mouth daily at 12 noon.    . furosemide (LASIX) 40 MG tablet TAKE ONE TABLET BY MOUTH IN THE MORNING 30 tablet 3  . glimepiride (AMARYL) 4 MG tablet Take 4 mg by mouth daily.      . metoprolol succinate (TOPROL-XL) 100 MG 24 hr tablet TAKE ONE TABLET BY MOUTH ONCE DAILY. TAKE WITH OR IMMEDIATELY FOLLOWING A MEAL 30 tablet 11  . ramipril (ALTACE) 10 MG capsule TAKE ONE CAPSULE BY MOUTH ONCE DAILY 30 capsule 10  . spironolactone (ALDACTONE) 25 MG tablet TAKE ONE TABLET BY MOUTH ONCE DAILY 30 tablet 11  . zolpidem (AMBIEN CR) 12.5 MG CR tablet Take 12.5 mg by mouth at bedtime.     . nitroGLYCERIN (NITROSTAT) 0.4 MG SL tablet Place 1 tablet (0.4 mg total) under the tongue every 5 (five) minutes as needed for chest pain. (Patient not taking: Reported on 03/20/2016) 25 tablet 1   No current facility-administered medications for this encounter.      PHYSICAL EXAM:  Vitals:   03/20/16 1424  BP: 126/72  Pulse: 79   General:  NAD. Ambulated in the clinic without difficulty HEENT Normal.  Neck veins were flat, carotids were brisk.  Lungs were clear.  Heart sounds were regular without murmurs or gallops.  Abdomen was soft with active bowel sounds. There is no clubbing cyanosis or edema. Skin Warm and dry Neuro: affect normal  cranial nerves intact.   ECG: NSR 72 with v-pacing  ASSESSMENT & PLAN: 1. Chronic systolic HF due to iCM EF 20% Continue current dose to toprol xl and altace.  Continue 25 mg spiro daily. Recent labs with PCP ok.  He was intolerant entresto due to joint pain. Doubt that is related but he refuses to re-challenge.  Refuse CPX or repeat ECHO.  2. CAD Continue aspirin and bb. He refuses statin due to GI upset.  3. DM2 4. HL- Refuses statin due  to GI upset. .   Follow up 1 year.   Darrick Grinder, NP-C  3:00 PM  Patient seen and examined with Darrick Grinder, NP. We discussed all aspects of the encounter. I agree with the assessment and plan as stated above.   He continue to do well despite severe LV dysfunction. NYHA II. Volume status ok. Intolerant of Entresto. No ischemic symptoms. Refuses repeat echo or CPX at this time. Will continue current regimen. He knows to call if he is feeling worse.Discussed need to cut back on whiskey intake.   Ramsha Lonigro,MD 11:11 PM

## 2016-03-20 NOTE — Patient Instructions (Signed)
Follow up in 1 year.

## 2016-05-29 ENCOUNTER — Ambulatory Visit (INDEPENDENT_AMBULATORY_CARE_PROVIDER_SITE_OTHER): Payer: Medicare Other | Admitting: Internal Medicine

## 2016-05-29 ENCOUNTER — Encounter: Payer: Self-pay | Admitting: Internal Medicine

## 2016-05-29 VITALS — BP 122/74 | HR 69 | Ht 76.0 in | Wt 244.2 lb

## 2016-05-29 DIAGNOSIS — I255 Ischemic cardiomyopathy: Secondary | ICD-10-CM

## 2016-05-29 DIAGNOSIS — I5022 Chronic systolic (congestive) heart failure: Secondary | ICD-10-CM

## 2016-05-29 DIAGNOSIS — Z95810 Presence of automatic (implantable) cardiac defibrillator: Secondary | ICD-10-CM | POA: Diagnosis not present

## 2016-05-29 LAB — CUP PACEART INCLINIC DEVICE CHECK
Brady Statistic RA Percent Paced: 1 % — CL
Brady Statistic RV Percent Paced: 98 %
HIGH POWER IMPEDANCE MEASURED VALUE: 47 Ohm
HIGH POWER IMPEDANCE MEASURED VALUE: 69 Ohm
Implantable Lead Implant Date: 20051207
Implantable Lead Implant Date: 20051207
Implantable Lead Location: 753859
Implantable Lead Location: 753860
Implantable Lead Model: 4543
Implantable Lead Model: 5076
Implantable Lead Serial Number: 148938
Implantable Pulse Generator Implant Date: 20110721
Lead Channel Impedance Value: 464 Ohm
Lead Channel Pacing Threshold Amplitude: 0.6 V
Lead Channel Sensing Intrinsic Amplitude: 25 mV
Lead Channel Sensing Intrinsic Amplitude: 5.7 mV
Lead Channel Setting Pacing Amplitude: 1.9 V
Lead Channel Setting Pacing Amplitude: 2.4 V
Lead Channel Setting Pacing Pulse Width: 0.4 ms
Lead Channel Setting Sensing Sensitivity: 1 mV
MDC IDC LEAD IMPLANT DT: 20110721
MDC IDC LEAD LOCATION: 753858
MDC IDC LEAD SERIAL: 205859
MDC IDC MSMT LEADCHNL LV IMPEDANCE VALUE: 891 Ohm
MDC IDC MSMT LEADCHNL LV PACING THRESHOLD AMPLITUDE: 0.9 V
MDC IDC MSMT LEADCHNL LV PACING THRESHOLD PULSEWIDTH: 0.4 ms
MDC IDC MSMT LEADCHNL LV SENSING INTR AMPL: 24.2 mV
MDC IDC MSMT LEADCHNL RA IMPEDANCE VALUE: 527 Ohm
MDC IDC MSMT LEADCHNL RA PACING THRESHOLD PULSEWIDTH: 0.4 ms
MDC IDC MSMT LEADCHNL RV PACING THRESHOLD AMPLITUDE: 0.8 V
MDC IDC MSMT LEADCHNL RV PACING THRESHOLD PULSEWIDTH: 0.4 ms
MDC IDC PG SERIAL: 139171
MDC IDC SESS DTM: 20171120050000
MDC IDC SET LEADCHNL LV PACING PULSEWIDTH: 0.4 ms
MDC IDC SET LEADCHNL RA PACING AMPLITUDE: 2 V
MDC IDC SET LEADCHNL RV SENSING SENSITIVITY: 0.5 mV

## 2016-05-29 LAB — BASIC METABOLIC PANEL
BUN: 17 mg/dL (ref 7–25)
CALCIUM: 9.6 mg/dL (ref 8.6–10.3)
CO2: 28 mmol/L (ref 20–31)
Chloride: 100 mmol/L (ref 98–110)
Creat: 0.87 mg/dL (ref 0.70–1.25)
Glucose, Bld: 130 mg/dL — ABNORMAL HIGH (ref 65–99)
POTASSIUM: 4.3 mmol/L (ref 3.5–5.3)
SODIUM: 136 mmol/L (ref 135–146)

## 2016-05-29 MED ORDER — CARVEDILOL 25 MG PO TABS: 25.0000 mg | ORAL_TABLET | Freq: Two times a day (BID) | ORAL | 6 refills | Status: DC

## 2016-05-29 NOTE — Patient Instructions (Signed)
Medication Instructions: Your physician has recommended you make the following change in your medication:  --1. STOP Metoprolol --2. START Carvedilol 25 mg - Take 1 tablet by mouth twice daily.   Labwork: TODAY : BMET  Procedures/Testing: None Ordered  Follow-Up: Remote monitoring is used to monitor your  ICD from home. This monitoring reduces the number of office visits required to check your device to one time per year. It allows [place] to keep an eye on the functioning of your device to ensure it is working properly. You are scheduled for a device check from home on 08/28/16. You may send your transmission at any time that day. If you have a wireless device, the transmission will be sent automatically. After your physician reviews your transmission, you will receive a postcard with your next transmission date.  Your physician wants you to follow-up in: 1 YEAR with Chanetta Marshall, NP. You will receive a reminder letter in the mail two months in advance. If you don't receive a letter, please call our office to schedule the follow-up appointment.   Any Additional Special Instructions Will Be Listed Below (If Applicable).     If you need a refill on your cardiac medications before your next appointment, please call your pharmacy.

## 2016-05-29 NOTE — Progress Notes (Signed)
Patient Care Team: Marton Redwood, MD as PCP - General (Internal Medicine)   HPI  [name] is a 60 y.o. male Seen in followup for congestive heart failure in the setting ischemic cardiomyopathy. with history of congestive heart failure secondary to ischemic cardiomyopathy with an EF of 20%. He is status post CRT-D implantation and underwent device generator replacement July 2011.  He is status post multiple myocardial infarctions and previous stents. He has a biventricular ICD in place. He under derwent device generator replacement in July 2011.  Chronically  shortness of breath no edema or chest pain   Myoview 2011 which showed EF 17% with markedly dilated LV EDV: 509 ml ESV: 422 ml. Extensive scar with trivial peri-infarct ischemia. CPX 12/11 pVO2 24.5 slope 31.7 RER 1.12 O2 pulse 105% echocardiogram 5/14 demonstrated LVEF 20% with mild LAE RAE   He was seen by the heart failure clinic 9/17. No medication changes or testing done. (Refused)   Stable symptoms are below Neck is here  He is still a bookie   Past Medical History:  Diagnosis Date  . CAD (coronary artery disease) last 2005   multiple MIs  . CHF (congestive heart failure) (HCC)    secondary to severe ischemic cardiomyopathy.   a.    EF 20% with akinesis of the distal half to two-thirds of the ventricle.  Trivial MR   b.     Status post bi-V ICD.   c.     Cardiopulmonary exercise test (12/11) pVO2 of 24.5,VE/VCO2 slope is 31,.7  . Colon polyps   . Diabetes mellitus   . Diverticulosis   . GERD (gastroesophageal reflux disease)   . Headache(784.0)   . Hemorrhoids   . Hyperlipidemia   . 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 Insomnia   . Left bundle branch block   . Prostate neoplasm   . Tinnitus    comes and goes    Past Surgical History:  Procedure Laterality Date  . CARDIAC CATHETERIZATION  2005   stent x 1 placed  . colonscopy     x 2  . dual chamber  defibrillator  06/15/2004  . HEMORRHOID BANDING  09-2012  . single-chamber ICD implantation  08/10/2003  . TONSILLECTOMY  age 33    Current Outpatient Prescriptions  Medication Sig Dispense Refill  . aspirin 81 MG tablet Take 81 mg by mouth daily.      Marland Kitchen esomeprazole (NEXIUM) 20 MG capsule Take 20 mg by mouth daily at 12 noon.    . furosemide (LASIX) 40 MG tablet TAKE ONE TABLET BY MOUTH IN THE MORNING 30 tablet 3  . glimepiride (AMARYL) 4 MG tablet Take 4 mg by mouth daily.      . nitroGLYCERIN (NITROSTAT) 0.4 MG SL tablet Place 1 tablet (0.4 mg total) under the tongue every 5 (five) minutes as needed for chest pain. 25 tablet 1  . ramipril (ALTACE) 10 MG capsule TAKE ONE CAPSULE BY MOUTH ONCE DAILY 30 capsule 10  . spironolactone (ALDACTONE) 25 MG tablet TAKE ONE TABLET BY MOUTH ONCE DAILY 30 tablet 11  . zolpidem (AMBIEN CR) 12.5 MG CR tablet Take 12.5 mg by mouth at bedtime.     . carvedilol (COREG) 25 MG tablet Take 1 tablet (25 mg total) by mouth 2 (two) times daily. 60 tablet 6   No current facility-administered medications for this visit.   Reviewed  No Known Allergies  Review of Systems negative  except from HPI and PMH  Physical Exam BP 122/74   Pulse 69   Ht 6\' 4"  (1.93 m)   Wt 244 lb 3.2 oz (110.8 kg)   SpO2 95%   BMI 29.72 kg/m  Well developed and well nourished in no acute distress HENT normal E scleral and icterus clear Neck Supple JVP flat; carotids brisk and full Clear to ausculation Device pocket well healed; without hematoma or erythema.  There is no tethering Regular rate and rhythm, no murmurs gallops or rub Soft with active bowel sounds No clubbing cyanosis none Edema Alert and oriented, grossly normal motor and sensory function Skin Warm and Dry  ECG demonstrates P. synchronous pacing with a QRS duration that was upright in lead V1 and neg 1  Assessment and  Plan  Ischemic cardiomyopathy  High Risk Medication Surveillance  Congestive heart  failure-chronic-systolic  Euvolemic continue current meds  Implantable defibrillator-CRT  The patient's device was interrogated.  The information was reviewed. No changes were made in the programming.     Insomnia  Could this be retlatd to his betablocker  Will try an alternative BB  Carvedilol 12.5 >> 25 bid .  Without symptoms of ischemia Check blood work on spironolactone and reviewed ints benefit

## 2016-06-06 ENCOUNTER — Other Ambulatory Visit: Payer: Self-pay | Admitting: Internal Medicine

## 2016-06-06 ENCOUNTER — Telehealth: Payer: Self-pay | Admitting: Internal Medicine

## 2016-06-06 MED ORDER — METOPROLOL SUCCINATE ER 100 MG PO TB24: 100.0000 mg | ORAL_TABLET | Freq: Every day | ORAL | 3 refills | Status: DC

## 2016-06-06 NOTE — Telephone Encounter (Signed)
The patient called the office today to report that he has been on coreg before and has not able to tolerate this. He was seen on 05/29/16 by Dr. Caryl Comes and Dr. Caryl Comes was going to have the patient stop metoprolol and start coreg due to insomnia. Per the patient, Dr. Caryl Comes referred to the medication as carvedilol and he did not recognize it by that name. However, he states he was on this about 4-5 years ago and by process of elimination, he discovered this was giving him daily diarrhea. He wants to continue his metoprolol succ 100 mg once daily and did not want me to speak with Dr. Caryl Comes about an alternative drug.  I advised I will send this refill in for him. He states he has also had intolerances to atorvastatin- diarrhea & entresto- joint pain. I advised I will add these 3 medications to his allergy list. He is agreeable.

## 2016-06-07 NOTE — Telephone Encounter (Signed)
metoprolol succinate (TOPROL-XL) 100 MG 24 hr tablet 90 tablet 3 06/06/2016 09/04/2016   Sig - Route: Take 1 tablet (100 mg total) by mouth daily. Take with or immediately following a meal. - Oral   Notes to Pharmacy: Patient cannot tolerate coreg- switching back to metoprolol   E-Prescribing Status: Receipt confirmed by pharmacy (06/06/2016 4:27 PM EST)   Pharmacy   [facility] [place], [place]

## 2016-06-12 ENCOUNTER — Other Ambulatory Visit: Payer: Self-pay | Admitting: Cardiology

## 2016-06-12 MED ORDER — RAMIPRIL 10 MG PO CAPS: 10.0000 mg | ORAL_CAPSULE | Freq: Every day | ORAL | 3 refills | Status: DC

## 2016-07-05 ENCOUNTER — Other Ambulatory Visit (HOSPITAL_COMMUNITY): Payer: Self-pay | Admitting: Cardiology

## 2016-07-26 ENCOUNTER — Ambulatory Visit: Payer: Medicare Other

## 2016-07-26 ENCOUNTER — Inpatient Hospital Stay
Admission: RE | Admit: 2016-07-26 | Discharge: 2016-07-26 | Disposition: A | Discharge status: Home / Self Care | Payer: Medicare Other | Source: Ambulatory Visit | Attending: Internal Medicine | Admitting: Internal Medicine

## 2016-07-31 ENCOUNTER — Ambulatory Visit
Admission: RE | Admit: 2016-07-31 | Discharge: 2016-07-31 | Disposition: A | Discharge status: Home / Self Care | Payer: Medicare Other | Source: Ambulatory Visit | Attending: Internal Medicine | Admitting: Internal Medicine

## 2016-07-31 DIAGNOSIS — F17211 Nicotine dependence, cigarettes, in remission: Secondary | ICD-10-CM

## 2016-08-28 ENCOUNTER — Ambulatory Visit (INDEPENDENT_AMBULATORY_CARE_PROVIDER_SITE_OTHER): Payer: Medicare Other | Admitting: *Deleted

## 2016-08-28 DIAGNOSIS — I255 Ischemic cardiomyopathy: Secondary | ICD-10-CM | POA: Diagnosis not present

## 2016-09-01 ENCOUNTER — Encounter: Payer: Self-pay | Admitting: Cardiology

## 2016-09-01 LAB — CUP PACEART REMOTE DEVICE CHECK
Battery Remaining Percentage: 87 %
Brady Statistic RA Percent Paced: 0 %
Brady Statistic RV Percent Paced: 96 %
Date Time Interrogation Session: 20180221184700
HighPow Impedance: 67 Ohm
Implantable Lead Implant Date: 20051207
Implantable Lead Implant Date: 20051207
Implantable Lead Location: 753858
Implantable Lead Location: 753860
Implantable Lead Model: 158
Implantable Lead Serial Number: 148938
Lead Channel Impedance Value: 469 Ohm
Lead Channel Impedance Value: 521 Ohm
Lead Channel Pacing Threshold Amplitude: 0.6 V
Lead Channel Pacing Threshold Pulse Width: 0.4 ms
Lead Channel Pacing Threshold Pulse Width: 0.4 ms
Lead Channel Setting Pacing Amplitude: 1.9 V
Lead Channel Setting Pacing Amplitude: 2 V
Lead Channel Setting Pacing Pulse Width: 0.4 ms
Lead Channel Setting Sensing Sensitivity: 1 mV
MDC IDC LEAD IMPLANT DT: 20110721
MDC IDC LEAD LOCATION: 753859
MDC IDC LEAD SERIAL: 205859
MDC IDC MSMT BATTERY REMAINING LONGEVITY: 54 mo
MDC IDC MSMT LEADCHNL LV IMPEDANCE VALUE: 885 Ohm
MDC IDC MSMT LEADCHNL LV PACING THRESHOLD AMPLITUDE: 0.9 V
MDC IDC MSMT LEADCHNL RV PACING THRESHOLD AMPLITUDE: 0.8 V
MDC IDC MSMT LEADCHNL RV PACING THRESHOLD PULSEWIDTH: 0.4 ms
MDC IDC PG IMPLANT DT: 20110721
MDC IDC SET LEADCHNL LV PACING PULSEWIDTH: 0.4 ms
MDC IDC SET LEADCHNL RV PACING AMPLITUDE: 2.4 V
MDC IDC SET LEADCHNL RV SENSING SENSITIVITY: 0.5 mV
Pulse Gen Serial Number: 139171

## 2016-09-01 NOTE — Progress Notes (Signed)
Remote ICD transmission.   

## 2016-09-15 ENCOUNTER — Encounter: Payer: Self-pay | Admitting: Cardiology

## 2016-10-11 ENCOUNTER — Telehealth: Payer: Self-pay

## 2016-10-11 NOTE — Telephone Encounter (Signed)
Pt stated that he no longer has a land line. Will order a cell adapter. Verified home address

## 2016-12-05 ENCOUNTER — Ambulatory Visit (INDEPENDENT_AMBULATORY_CARE_PROVIDER_SITE_OTHER): Payer: Medicare Other | Admitting: *Deleted

## 2016-12-05 DIAGNOSIS — I255 Ischemic cardiomyopathy: Secondary | ICD-10-CM | POA: Diagnosis not present

## 2016-12-05 NOTE — Progress Notes (Signed)
Remote ICD transmission.   

## 2016-12-06 LAB — CUP PACEART REMOTE DEVICE CHECK
Implantable Lead Implant Date: 20110721
Implantable Lead Location: 753859
Implantable Lead Location: 753860
Implantable Lead Model: 5076
Implantable Lead Serial Number: 148938
Implantable Pulse Generator Implant Date: 20110721
Lead Channel Sensing Intrinsic Amplitude: 5.1 mV
Lead Channel Setting Pacing Amplitude: 2.4 V
Lead Channel Setting Pacing Pulse Width: 0.4 ms
Lead Channel Setting Pacing Pulse Width: 0.4 ms
MDC IDC LEAD IMPLANT DT: 20051207
MDC IDC LEAD IMPLANT DT: 20051207
MDC IDC LEAD LOCATION: 753858
MDC IDC LEAD SERIAL: 205859
MDC IDC SESS DTM: 20180530102256
MDC IDC SET LEADCHNL LV PACING AMPLITUDE: 1.9 V
MDC IDC SET LEADCHNL LV SENSING SENSITIVITY: 1 mV
MDC IDC SET LEADCHNL RA PACING AMPLITUDE: 2 V
MDC IDC SET LEADCHNL RV SENSING SENSITIVITY: 0.5 mV
MDC IDC STAT BRADY RA PERCENT PACED: 0 %
MDC IDC STAT BRADY RV PERCENT PACED: 97 %
Pulse Gen Serial Number: 139171

## 2016-12-12 ENCOUNTER — Encounter: Payer: Self-pay | Admitting: Cardiology

## 2016-12-26 ENCOUNTER — Encounter: Payer: Self-pay | Admitting: Cardiology

## 2017-01-25 ENCOUNTER — Other Ambulatory Visit: Payer: Self-pay | Admitting: Internal Medicine

## 2017-01-25 DIAGNOSIS — R911 Solitary pulmonary nodule: Secondary | ICD-10-CM

## 2017-01-26 ENCOUNTER — Ambulatory Visit
Admission: RE | Admit: 2017-01-26 | Discharge: 2017-01-26 | Disposition: A | Discharge status: Home / Self Care | Payer: Medicare Other | Source: Ambulatory Visit | Attending: Internal Medicine | Admitting: Internal Medicine

## 2017-01-26 DIAGNOSIS — R911 Solitary pulmonary nodule: Secondary | ICD-10-CM

## 2017-03-06 ENCOUNTER — Ambulatory Visit (INDEPENDENT_AMBULATORY_CARE_PROVIDER_SITE_OTHER): Payer: Medicare Other | Admitting: *Deleted

## 2017-03-06 DIAGNOSIS — I255 Ischemic cardiomyopathy: Secondary | ICD-10-CM | POA: Diagnosis not present

## 2017-03-06 NOTE — Progress Notes (Signed)
Remote ICD transmission.   

## 2017-03-09 LAB — CUP PACEART REMOTE DEVICE CHECK
Brady Statistic RA Percent Paced: 0 %
Date Time Interrogation Session: 20180828080500
HighPow Impedance: 70 Ohm
Implantable Lead Implant Date: 20051207
Implantable Lead Implant Date: 20051207
Implantable Lead Location: 753859
Implantable Lead Location: 753860
Implantable Lead Model: 158
Implantable Lead Model: 5076
Implantable Lead Serial Number: 148938
Implantable Pulse Generator Implant Date: 20110721
Lead Channel Impedance Value: 489 Ohm
Lead Channel Impedance Value: 534 Ohm
Lead Channel Impedance Value: 897 Ohm
Lead Channel Pacing Threshold Amplitude: 0.8 V
Lead Channel Pacing Threshold Pulse Width: 0.4 ms
Lead Channel Setting Pacing Amplitude: 1.9 V
Lead Channel Setting Pacing Amplitude: 2.4 V
Lead Channel Setting Pacing Pulse Width: 0.4 ms
Lead Channel Setting Pacing Pulse Width: 0.4 ms
MDC IDC LEAD IMPLANT DT: 20110721
MDC IDC LEAD LOCATION: 753858
MDC IDC LEAD SERIAL: 205859
MDC IDC MSMT BATTERY REMAINING LONGEVITY: 54 mo
MDC IDC MSMT BATTERY REMAINING PERCENTAGE: 83 %
MDC IDC MSMT LEADCHNL LV PACING THRESHOLD AMPLITUDE: 0.9 V
MDC IDC MSMT LEADCHNL LV PACING THRESHOLD PULSEWIDTH: 0.4 ms
MDC IDC MSMT LEADCHNL RA PACING THRESHOLD AMPLITUDE: 0.6 V
MDC IDC MSMT LEADCHNL RA PACING THRESHOLD PULSEWIDTH: 0.4 ms
MDC IDC SET LEADCHNL LV SENSING SENSITIVITY: 1 mV
MDC IDC SET LEADCHNL RA PACING AMPLITUDE: 2 V
MDC IDC SET LEADCHNL RV SENSING SENSITIVITY: 0.5 mV
MDC IDC STAT BRADY RV PERCENT PACED: 97 %
Pulse Gen Serial Number: 139171

## 2017-03-16 ENCOUNTER — Encounter: Payer: Self-pay | Admitting: Cardiology

## 2017-04-09 ENCOUNTER — Telehealth: Payer: Self-pay | Admitting: *Deleted

## 2017-04-09 NOTE — Telephone Encounter (Signed)
Called patient about AF alert. Persistent AF since 03/11/17. Patient denies any increased sx's of ShOB, fatigue, or palps over the past month. Patient states that overall he has no new c/o. Patient is not currently taking an [place]. Will forward information to Dr.Klein and notify patient if anything further is recommended. Patient verbalized understanding.

## 2017-04-10 ENCOUNTER — Telehealth: Payer: Self-pay

## 2017-04-10 NOTE — Telephone Encounter (Signed)
Had an extensive conversation with patient regarding afib and Dr. Aquilla Hacker recommendation that patient be seen in the afib clinic. Patient was somewhat resistant but agreed to an appointment to discuss afib and potential treatment plans. Appointment made for 10/11 at 10am. Patient verbalized understanding.

## 2017-04-11 ENCOUNTER — Other Ambulatory Visit: Payer: Self-pay | Admitting: Internal Medicine

## 2017-04-19 ENCOUNTER — Other Ambulatory Visit: Payer: Self-pay

## 2017-04-19 ENCOUNTER — Encounter (HOSPITAL_COMMUNITY): Payer: Self-pay | Admitting: Nurse Practitioner

## 2017-04-19 ENCOUNTER — Ambulatory Visit (HOSPITAL_COMMUNITY)
Admission: RE | Admit: 2017-04-19 | Discharge: 2017-04-19 | Disposition: A | Discharge status: Home / Self Care | Payer: Medicare Other | Source: Ambulatory Visit | Attending: Nurse Practitioner | Admitting: Nurse Practitioner

## 2017-04-19 VITALS — BP 118/64 | HR 71 | Ht 76.0 in | Wt 228.6 lb

## 2017-04-19 DIAGNOSIS — E785 Hyperlipidemia, unspecified: Secondary | ICD-10-CM | POA: Diagnosis not present

## 2017-04-19 DIAGNOSIS — Z79899 Other long term (current) drug therapy: Secondary | ICD-10-CM | POA: Diagnosis not present

## 2017-04-19 DIAGNOSIS — I481 Persistent atrial fibrillation: Secondary | ICD-10-CM | POA: Insufficient documentation

## 2017-04-19 DIAGNOSIS — E119 Type 2 diabetes mellitus without complications: Secondary | ICD-10-CM | POA: Insufficient documentation

## 2017-04-19 DIAGNOSIS — Z87891 Personal history of nicotine dependence: Secondary | ICD-10-CM | POA: Diagnosis not present

## 2017-04-19 DIAGNOSIS — K219 Gastro-esophageal reflux disease without esophagitis: Secondary | ICD-10-CM | POA: Insufficient documentation

## 2017-04-19 DIAGNOSIS — I447 Left bundle-branch block, unspecified: Secondary | ICD-10-CM | POA: Diagnosis not present

## 2017-04-19 DIAGNOSIS — I4892 Unspecified atrial flutter: Secondary | ICD-10-CM | POA: Insufficient documentation

## 2017-04-19 DIAGNOSIS — Z7982 Long term (current) use of aspirin: Secondary | ICD-10-CM | POA: Diagnosis not present

## 2017-04-19 DIAGNOSIS — I509 Heart failure, unspecified: Secondary | ICD-10-CM | POA: Diagnosis not present

## 2017-04-19 DIAGNOSIS — G4700 Insomnia, unspecified: Secondary | ICD-10-CM | POA: Insufficient documentation

## 2017-04-19 DIAGNOSIS — I255 Ischemic cardiomyopathy: Secondary | ICD-10-CM | POA: Diagnosis not present

## 2017-04-19 DIAGNOSIS — I2510 Atherosclerotic heart disease of native coronary artery without angina pectoris: Secondary | ICD-10-CM | POA: Insufficient documentation

## 2017-04-19 DIAGNOSIS — Z950 Presence of cardiac pacemaker: Secondary | ICD-10-CM | POA: Insufficient documentation

## 2017-04-19 NOTE — Progress Notes (Signed)
Primary Care Physician: Marton Redwood, MD Referring Physician: [facility] Device clinic EP: Dr. Caryl Comes AHP: Dr. Modesta Messing Desha is a 61 y.o. male with a h/o congestive heart failure in the setting ischemic cardiomyopathy.with an EF of 20%. He is status post CRT-D implantation and underwent device generator replacement July 2011. He is status post multiple myocardial infarctions and previous stents. He has a biventricular ICD in place. He under derwent device generator replacement in July 2011. He is followed by Dr. Caryl Comes and Dr. Haroldine Laws in the HF clinic.  He is in the afib clinic today, after it was noted on his remote device check that he has had persistent afib since 03/11/17. He is not on anticoagulation and has a chadsvasc score of 3. Pt denies that he is having any symptoms of fatigue, swelling, over the last month. He thinks the arhythmia was caused by change in his DM med to [place]. Discussed indication for anticoagulation with chadsvasc score of 3 and he is strongly against any use of anticoagulation. He was at one time on coumadin and got tired of dealing with nicks and bleeding. Also concerned re his riding a motorcycle. Did not have any major issues with bleeding on coumadin and does not give a bleeding history. He had labs drawn by PCP in August with a normal TSH. He has intentionally lost form 244 to 228 over the last year. He does drink bourbon nightly. Denies snoring but lives alone and states he overall sleeps poorly. Minimal caffeine, no tobacco.  Today, he denies symptoms of palpitations, chest pain, shortness of breath, orthopnea, PND, lower extremity edema, dizziness, presyncope, syncope, or neurologic sequela. The patient is tolerating medications without difficulties and is otherwise without complaint today.   Past Medical History:  Diagnosis Date  . CAD (coronary artery disease) last 2005   multiple MIs  . CHF (congestive heart failure) (HCC)    secondary to severe  ischemic cardiomyopathy.   a.    EF 20% with akinesis of the distal half to two-thirds of the ventricle.  Trivial MR   b.     Status post bi-V ICD.   c.     Cardiopulmonary exercise test (12/11) pVO2 of 24.5,VE/VCO2 slope is 31,.7  . Colon polyps   . Diabetes mellitus   . Diverticulosis   . GERD (gastroesophageal reflux disease)   . Headache(784.0)   . Hemorrhoids   . Hyperlipidemia   . 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 Insomnia   . Left bundle branch block   . Prostate neoplasm   . Tinnitus    comes and goes   Past Surgical History:  Procedure Laterality Date  . CARDIAC CATHETERIZATION  2005   stent x 1 placed  . colonscopy     x 2  . dual chamber defibrillator  06/15/2004  . HEMORRHOID BANDING  09-2012  . single-chamber ICD implantation  08/10/2003  . TONSILLECTOMY  age 55    Current Outpatient Prescriptions  Medication Sig Dispense Refill  . aspirin 81 MG tablet Take 81 mg by mouth daily.      . empagliflozin (JARDIANCE) 25 MG TABS tablet Take 25 mg by mouth daily.    Marland Kitchen esomeprazole (NEXIUM) 20 MG capsule Take 20 mg by mouth daily at 12 noon.    . furosemide (LASIX) 40 MG tablet Take 1 tablet (40 mg total) by mouth every morning. 90 tablet 3  . metoprolol succinate (TOPROL-XL)  100 MG 24 hr tablet Take 1 tablet (100 mg total) by mouth daily. Take with or immediately following a meal. 90 tablet 3  . nitroGLYCERIN (NITROSTAT) 0.4 MG SL tablet Place 1 tablet (0.4 mg total) under the tongue every 5 (five) minutes as needed for chest pain. 25 tablet 1  . ramipril (ALTACE) 10 MG capsule Take 1 capsule (10 mg total) by mouth daily. 90 capsule 3  . spironolactone (ALDACTONE) 25 MG tablet TAKE ONE TABLET BY MOUTH ONCE DAILY 30 tablet 11  . spironolactone (ALDACTONE) 25 MG tablet TAKE ONE TABLET BY MOUTH ONCE DAILY IN THE MORNING 90 tablet 3  . zolpidem (AMBIEN CR) 12.5 MG CR tablet Take 12.5 mg by mouth at bedtime.      No current  facility-administered medications for this encounter.     Allergies  Allergen Reactions  . Coreg [Carvedilol] Diarrhea  . Entresto [Sacubitril-Valsartan] Other (See Comments)    Joint pain  . Lipitor [Atorvastatin] Diarrhea    Social History   Social History  . Marital status: Single    Spouse name: N/A  . Number of children: N/A  . Years of education: N/A   Occupational History  . Not on file.   Social History Main Topics  . Smoking status: Former Smoker    Packs/day: 2.00    Years: 30.00    Types: Cigarettes    Quit date: 07/20/2003  . Smokeless tobacco: Never Used  . Alcohol use Yes     Comment: Occasional.  . Drug use: Yes     Comment: admits to cocaine in the early 1980s and not since  . Sexual activity: Not on file   Other Topics Concern  . Not on file   Social History Narrative  . No narrative on file    Family History  Problem Relation Age of Onset  . Coronary artery disease Unknown     ROS- All systems are reviewed and negative except as per the HPI above  Physical Exam: Vitals:   04/19/17 1005  BP: 118/64  Pulse: 71  Weight: 228 lb 9.6 oz (103.7 kg)  Height: 6\' 4"  (1.93 m)   Wt Readings from Last 3 Encounters:  04/19/17 228 lb 9.6 oz (103.7 kg)  05/29/16 244 lb 3.2 oz (110.8 kg)  03/20/16 239 lb (108.4 kg)    Labs: Lab Results  Component Value Date   NA 136 05/29/2016   K 4.3 05/29/2016   CL 100 05/29/2016   CO2 28 05/29/2016   GLUCOSE 130 (H) 05/29/2016   BUN 17 05/29/2016   CREATININE 0.87 05/29/2016   CALCIUM 9.6 05/29/2016   Lab Results  Component Value Date   INR 1.0 ratio 01/25/2010   Lab Results  Component Value Date   CHOL 99 12/02/2008   HDL 39.60 12/02/2008   LDLCALC 36 12/02/2008   TRIG 118.0 12/02/2008     GEN- The patient is well appearing, alert and oriented x 3 today.   Head- normocephalic, atraumatic Eyes-  Sclera clear, conjunctiva pink Ears- hearing intact Oropharynx- clear Neck- supple, no  JVP Lymph- no cervical lymphadenopathy Lungs- Clear to ausculation bilaterally, normal work of breathing Heart- Regular rate and rhythm, no murmurs, rubs or gallops, PMI not laterally displaced GI- soft, NT, ND, + BS Extremities- no clubbing, cyanosis, or edema MS- no significant deformity or atrophy Skin- no rash or lesion Psych- euthymic mood, full affect Neuro- strength and sensation are intact  EKG-v paced rhythm with what appears to be atrial  flutter at 71 bpm    Assessment and Plan:  1. New onset afib/flutter x one month Pt states that he is asymptomatic, rate controlled. He believes this happened because of a change in his DM medicine and has gone back to his previous DM med, to see if his will convert back to SR Chadsvasc score of at least  3 Very adamant/argumentative that he is not going on anticoagulation after I patiently described his stroke risk and that he could use DOAC, did not have to go back to coumadin, He said he has seen the TV commercials on xarelto /law suits and he does not want any part of that drug Eliquis also discussed, he declined  He does drink alcohol nightly and I recommended reducing to no more thatn 2 a week  2. H/o ischemic cardiomyopathy I worry that the arrhythmia could worsen his EF So far states no symptoms Weight stable I explained for this purpose, it would be important to return to SR, but he would have to be on anticoagulation x at least 3 weeks before we could try to intervene He still declines any intervention  He does have a f/u with Chanetta Marshall, NP in [place] clinic  11/7 He missed his September appointment with Dr. Haroldine Laws and I will request rescheduling  afib clinic as needed  Butch Penny C. Jaelle Campanile, [facility] [address] [place], [place] [address] [phone]

## 2017-04-22 NOTE — Telephone Encounter (Signed)
Was referred to afib clinic for anticoaglaiton

## 2017-05-15 ENCOUNTER — Encounter: Payer: Self-pay | Admitting: Nurse Practitioner

## 2017-05-15 NOTE — H&P (View-Only) (Signed)
Electrophysiology Office Note Date: 05/16/2017  ID:  [name], DOB 07/18/1955, MRN [id number]  PCP: Marton Redwood, MD Primary Cardiologist: [place] Electrophysiologist: Caryl Comes  CC: Routine ICD follow-up  [name] is a 61 y.o. male seen today for Dr Caryl Comes.  He presents today for routine electrophysiology followup.  Since last being seen in our clinic, the patient reports doing reasonably well.  He feels that his shortness of breath is worse since being seen in AF clinic.  He is discouraged that his AF has persisted. He was sure it was because of change from [place] to [place].  He denies chest pain,PND, orthopnea, nausea, vomiting, dizziness, syncope, edema, weight gain, or early satiety.  He has not had ICD shocks.   Device History: BSX CRTD implanted 2005 for ICM, CHF; gen change 2011 History of appropriate therapy: No History of AAD therapy: No   Past Medical History:  Diagnosis Date  . CAD (coronary artery disease) last 2005   multiple MIs  . CHF (congestive heart failure) (HCC)    secondary to severe ischemic cardiomyopathy.   a.    EF 20% with akinesis of the distal half to two-thirds of the ventricle.  Trivial MR   b.     Status post bi-V ICD.   c.     Cardiopulmonary exercise test (12/11) pVO2 of 24.5,VE/VCO2 slope is 31,.7  . Colon polyps   . Diabetes mellitus   . Diverticulosis   . GERD (gastroesophageal reflux disease)   . Headache(784.0)   . Hemorrhoids   . Hyperlipidemia   . 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 Insomnia   . Left bundle branch block   . Persistent atrial fibrillation ([place])   . Prostate neoplasm   . Tinnitus    comes and goes   Past Surgical History:  Procedure Laterality Date  . CARDIAC CATHETERIZATION  2005   stent x 1 placed  . colonscopy     x 2  . dual chamber defibrillator  06/15/2004  . HEMORRHOID BANDING  09-2012  . single-chamber ICD implantation  08/10/2003  .  TONSILLECTOMY  age 49    Current Outpatient Medications  Medication Sig Dispense Refill  . apixaban (ELIQUIS) 5 MG TABS tablet Take 1 tablet (5 mg total) 2 (two) times daily by mouth. 60 tablet 5  . esomeprazole (NEXIUM) 20 MG capsule Take 20 mg by mouth daily at 12 noon.    . furosemide (LASIX) 40 MG tablet Take 1 tablet (40 mg total) by mouth every morning. 90 tablet 3  . glimepiride (AMARYL) 4 MG tablet Take 4 mg daily by mouth.    . nitroGLYCERIN (NITROSTAT) 0.4 MG SL tablet Place 1 tablet (0.4 mg total) under the tongue every 5 (five) minutes as needed for chest pain. 25 tablet 1  . ramipril (ALTACE) 10 MG capsule Take 1 capsule (10 mg total) by mouth daily. 90 capsule 3  . spironolactone (ALDACTONE) 25 MG tablet TAKE ONE TABLET BY MOUTH ONCE DAILY IN THE MORNING 90 tablet 3  . zolpidem (AMBIEN CR) 12.5 MG CR tablet Take 12.5 mg by mouth at bedtime.     . metoprolol succinate (TOPROL-XL) 100 MG 24 hr tablet Take 1 tablet (100 mg total) by mouth daily. Take with or immediately following a meal. 90 tablet 3   No current facility-administered medications for this visit.     Allergies:   Coreg [carvedilol]; Entresto [sacubitril-valsartan]; and Lipitor [atorvastatin]  Social History: Social History   Socioeconomic History  . Marital status: Single    Spouse name: Not on file  . Number of children: Not on file  . Years of education: Not on file  . Highest education level: Not on file  Social Needs  . Financial resource strain: Not on file  . Food insecurity - worry: Not on file  . Food insecurity - inability: Not on file  . Transportation needs - medical: Not on file  . Transportation needs - non-medical: Not on file  Occupational History  . Not on file  Tobacco Use  . Smoking status: Former Smoker    Packs/day: 2.00    Years: 30.00    Pack years: 60.00    Types: Cigarettes    Last attempt to quit: 07/20/2003    Years since quitting: 13.8  . Smokeless tobacco: Never Used    Substance and Sexual Activity  . Alcohol use: Yes    Comment: Occasional.  . 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 Sexual activity: Not on file  Other Topics Concern  . Not on file  Social History Narrative  . Not on file    Family History: Family History  Problem Relation Age of Onset  . Coronary artery disease Unknown     Review of Systems: All other systems reviewed and are otherwise negative except as noted above.   Physical Exam: VS:  BP 140/60   Pulse 75   Ht 6\' 4"  (1.93 m)   Wt 230 lb 12.8 oz (104.7 kg)   BMI 28.09 kg/m  , BMI Body mass index is 28.09 kg/m.  GEN- The patient is well appearing, alert and oriented x 3 today.   HEENT: normocephalic, atraumatic; sclera clear, conjunctiva pink; hearing intact; oropharynx clear; neck supple  Lungs- Clear to ausculation bilaterally, normal work of breathing.  No wheezes, rales, rhonchi Heart- Regular rate and rhythm (paced) GI- soft, non-tender, non-distended, bowel sounds present  Extremities- no clubbing, cyanosis, or edema  MS- no significant deformity or atrophy Skin- warm and dry, no rash or lesion; ICD pocket well healed Psych- euthymic mood, full affect Neuro- strength and sensation are intact  ICD interrogation- reviewed in detail today,  See PACEART report  EKG:  EKG is not ordered today.  Recent Labs: 05/29/2016: BUN 17; Creat 0.87; Potassium 4.3; Sodium 136   Wt Readings from Last 3 Encounters:  05/16/17 230 lb 12.8 oz (104.7 kg)  04/19/17 228 lb 9.6 oz (103.7 kg)  05/29/16 244 lb 3.2 oz (110.8 kg)     Other studies Reviewed: Additional studies/ records that were reviewed today include: AF clinic notes, Dr Olin Pia office notes   Assessment and Plan:  1.  Chronic systolic dysfunction euvolemic today Stable on an appropriate medical regimen Normal ICD function See Pace Art report No changes today  2.  Persistent AF Burden by device interrogation  100% since seen in AF clinic V rates controlled Butch Penny spent a lot of time going over rationale for [facility] at last office visit. He declined at that time. We discussed again today that with worsening HF symptoms, restoration of SR is important. He is now willing to proceed. CHADS2VASC is at least 3 Will start Eliquis 5mg  twice daily today and plan DCCV in 3 weeks CBC, BMET today   3.  HTN Stable No change required today    Current medicines are reviewed at length with the  patient today.   The patient does not have concerns regarding his medicines.  The following changes were made today:  Start Eliquis 5mg  twice daily, stop ASA   Labs/ tests ordered today include: none Orders Placed This Encounter  Procedures  . Basic metabolic panel  . CBC  . CUP PACEART INCLINIC DEVICE CHECK     Disposition:   Follow up with [facility], Dr Caryl Comes 6-8 weeks   Signed, Chanetta Marshall, NP 05/16/2017 3:04 PM  [facility] [address] [place] [address] [phone] (office) [phone] (fax)

## 2017-05-15 NOTE — Progress Notes (Signed)
Electrophysiology Office Note Date: 05/16/2017  ID:  Michaeljames Milnes, DOB 04-02-1956, MRN [id number]  PCP: Marton Redwood, MD Primary Cardiologist: [place] Electrophysiologist: Caryl Comes  CC: Routine ICD follow-up  Taequan Stockhausen is a 61 y.o. male seen today for Dr Caryl Comes.  He presents today for routine electrophysiology followup.  Since last being seen in our clinic, the patient reports doing reasonably well.  He feels that his shortness of breath is worse since being seen in AF clinic.  He is discouraged that his AF has persisted. He was sure it was because of change from Glimeperide to [place].  He denies chest pain,PND, orthopnea, nausea, vomiting, dizziness, syncope, edema, weight gain, or early satiety.  He has not had ICD shocks.   Device History: BSX CRTD implanted 2005 for ICM, CHF; gen change 2011 History of appropriate therapy: No History of AAD therapy: No   Past Medical History:  Diagnosis Date  . CAD (coronary artery disease) last 2005   multiple MIs  . CHF (congestive heart failure) (HCC)    secondary to severe ischemic cardiomyopathy.   a.    EF 20% with akinesis of the distal half to two-thirds of the ventricle.  Trivial MR   b.     Status post bi-V ICD.   c.     Cardiopulmonary exercise test (12/11) pVO2 of 24.5,VE/VCO2 slope is 31,.7  . Colon polyps   . Diabetes mellitus   . Diverticulosis   . GERD (gastroesophageal reflux disease)   . Headache(784.0)   . Hemorrhoids   . Hyperlipidemia   . 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 Insomnia   . Left bundle branch block   . Persistent atrial fibrillation ([place])   . Prostate neoplasm   . Tinnitus    comes and goes   Past Surgical History:  Procedure Laterality Date  . CARDIAC CATHETERIZATION  2005   stent x 1 placed  . colonscopy     x 2  . dual chamber defibrillator  06/15/2004  . HEMORRHOID BANDING  09-2012  . single-chamber ICD implantation  08/10/2003  .  TONSILLECTOMY  age 69    Current Outpatient Medications  Medication Sig Dispense Refill  . apixaban (ELIQUIS) 5 MG TABS tablet Take 1 tablet (5 mg total) 2 (two) times daily by mouth. 60 tablet 5  . esomeprazole (NEXIUM) 20 MG capsule Take 20 mg by mouth daily at 12 noon.    . furosemide (LASIX) 40 MG tablet Take 1 tablet (40 mg total) by mouth every morning. 90 tablet 3  . glimepiride (AMARYL) 4 MG tablet Take 4 mg daily by mouth.    . nitroGLYCERIN (NITROSTAT) 0.4 MG SL tablet Place 1 tablet (0.4 mg total) under the tongue every 5 (five) minutes as needed for chest pain. 25 tablet 1  . ramipril (ALTACE) 10 MG capsule Take 1 capsule (10 mg total) by mouth daily. 90 capsule 3  . spironolactone (ALDACTONE) 25 MG tablet TAKE ONE TABLET BY MOUTH ONCE DAILY IN THE MORNING 90 tablet 3  . zolpidem (AMBIEN CR) 12.5 MG CR tablet Take 12.5 mg by mouth at bedtime.     . metoprolol succinate (TOPROL-XL) 100 MG 24 hr tablet Take 1 tablet (100 mg total) by mouth daily. Take with or immediately following a meal. 90 tablet 3   No current facility-administered medications for this visit.     Allergies:   Coreg [carvedilol]; Entresto [sacubitril-valsartan]; and Lipitor [atorvastatin]  Social History: Social History   Socioeconomic History  . Marital status: Single    Spouse name: Not on file  . Number of children: Not on file  . Years of education: Not on file  . Highest education level: Not on file  Social Needs  . Financial resource strain: Not on file  . Food insecurity - worry: Not on file  . Food insecurity - inability: Not on file  . Transportation needs - medical: Not on file  . Transportation needs - non-medical: Not on file  Occupational History  . Not on file  Tobacco Use  . Smoking status: Former Smoker    Packs/day: 2.00    Years: 30.00    Pack years: 60.00    Types: Cigarettes    Last attempt to quit: 07/20/2003    Years since quitting: 13.8  . Smokeless tobacco: Never Used    Substance and Sexual Activity  . Alcohol use: Yes    Comment: Occasional.  . 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 Sexual activity: Not on file  Other Topics Concern  . Not on file  Social History Narrative  . Not on file    Family History: Family History  Problem Relation Age of Onset  . Coronary artery disease Unknown     Review of Systems: All other systems reviewed and are otherwise negative except as noted above.   Physical Exam: VS:  BP 140/60   Pulse 75   Ht 6\' 4"  (1.93 m)   Wt 230 lb 12.8 oz (104.7 kg)   BMI 28.09 kg/m  , BMI Body mass index is 28.09 kg/m.  GEN- The patient is well appearing, alert and oriented x 3 today.   HEENT: normocephalic, atraumatic; sclera clear, conjunctiva pink; hearing intact; oropharynx clear; neck supple  Lungs- Clear to ausculation bilaterally, normal work of breathing.  No wheezes, rales, rhonchi Heart- Regular rate and rhythm (paced) GI- soft, non-tender, non-distended, bowel sounds present  Extremities- no clubbing, cyanosis, or edema  MS- no significant deformity or atrophy Skin- warm and dry, no rash or lesion; ICD pocket well healed Psych- euthymic mood, full affect Neuro- strength and sensation are intact  ICD interrogation- reviewed in detail today,  See PACEART report  EKG:  EKG is not ordered today.  Recent Labs: 05/29/2016: BUN 17; Creat 0.87; Potassium 4.3; Sodium 136   Wt Readings from Last 3 Encounters:  05/16/17 230 lb 12.8 oz (104.7 kg)  04/19/17 228 lb 9.6 oz (103.7 kg)  05/29/16 244 lb 3.2 oz (110.8 kg)     Other studies Reviewed: Additional studies/ records that were reviewed today include: AF clinic notes, Dr Olin Pia office notes   Assessment and Plan:  1.  Chronic systolic dysfunction euvolemic today Stable on an appropriate medical regimen Normal ICD function See Pace Art report No changes today  2.  Persistent AF Burden by device interrogation  100% since seen in AF clinic V rates controlled Butch Penny spent a lot of time going over rationale for [facility] at last office visit. He declined at that time. We discussed again today that with worsening HF symptoms, restoration of SR is important. He is now willing to proceed. CHADS2VASC is at least 3 Will start Eliquis 5mg  twice daily today and plan DCCV in 3 weeks CBC, BMET today   3.  HTN Stable No change required today    Current medicines are reviewed at length with the  patient today.   The patient does not have concerns regarding his medicines.  The following changes were made today:  Start Eliquis 5mg  twice daily, stop ASA   Labs/ tests ordered today include: none Orders Placed This Encounter  Procedures  . Basic metabolic panel  . CBC  . CUP PACEART INCLINIC DEVICE CHECK     Disposition:   Follow up with [facility], Dr Caryl Comes 6-8 weeks   Signed, Chanetta Marshall, NP 05/16/2017 3:04 PM  [facility] [address] [place] [address] [phone] (office) [phone] (fax)

## 2017-05-16 ENCOUNTER — Ambulatory Visit: Payer: Medicare Other | Admitting: Nurse Practitioner

## 2017-05-16 ENCOUNTER — Encounter: Payer: Self-pay | Admitting: *Deleted

## 2017-05-16 ENCOUNTER — Encounter: Payer: Self-pay | Admitting: Nurse Practitioner

## 2017-05-16 VITALS — BP 140/60 | HR 75 | Ht 76.0 in | Wt 230.8 lb

## 2017-05-16 DIAGNOSIS — I5022 Chronic systolic (congestive) heart failure: Secondary | ICD-10-CM | POA: Diagnosis not present

## 2017-05-16 DIAGNOSIS — I4891 Unspecified atrial fibrillation: Secondary | ICD-10-CM

## 2017-05-16 LAB — CUP PACEART INCLINIC DEVICE CHECK
Implantable Lead Implant Date: 20051207
Implantable Lead Implant Date: 20110721
Implantable Lead Location: 753858
Implantable Lead Location: 753859
Implantable Lead Location: 753860
Implantable Lead Model: 4543
Implantable Lead Model: 5076
Implantable Pulse Generator Implant Date: 20110721
MDC IDC LEAD IMPLANT DT: 20051207
MDC IDC LEAD SERIAL: 148938
MDC IDC LEAD SERIAL: 205859
MDC IDC PG SERIAL: 139171
MDC IDC SESS DTM: 20181107110643

## 2017-05-16 MED ORDER — APIXABAN 5 MG PO TABS: 5.0000 mg | ORAL_TABLET | Freq: Two times a day (BID) | ORAL | 5 refills | Status: DC

## 2017-05-16 NOTE — Patient Instructions (Addendum)
Medication Instructions:   STOP TAKING ASPIRIN 81 MG ONCE A DAY   START TAKING ELIQUIS 5 MG TWICE DAY    If you need a refill on your cardiac medications before your next appointment, please call your pharmacy.  Labwork: CBC AND BMET  TODAY     Testing/Procedures: SEE LETTER  FOR CARDIOVERSION ON 06-06-17     Follow-Up: 6 TO 8 WEEKS WITH DR Caryl Comes  POST CARDIOVERSION      Any Other Special Instructions Will Be Listed Below (If Applicable).

## 2017-05-17 LAB — CBC
HEMATOCRIT: 44.8 % (ref 37.5–51.0)
HEMOGLOBIN: 14.4 g/dL (ref 13.0–17.7)
MCH: 28.4 pg (ref 26.6–33.0)
MCHC: 32.1 g/dL (ref 31.5–35.7)
MCV: 88 fL (ref 79–97)
PLATELETS: 263 10*3/uL (ref 150–379)
RBC: 5.07 x10E6/uL (ref 4.14–5.80)
RDW: 14.7 % (ref 12.3–15.4)
WBC: 7.3 10*3/uL (ref 3.4–10.8)

## 2017-05-17 LAB — BASIC METABOLIC PANEL
BUN / CREAT RATIO: 16 (ref 10–24)
BUN: 16 mg/dL (ref 8–27)
CHLORIDE: 100 mmol/L (ref 96–106)
CO2: 24 mmol/L (ref 20–29)
Calcium: 9.5 mg/dL (ref 8.6–10.2)
Creatinine, Ser: 0.99 mg/dL (ref 0.76–1.27)
GFR, EST AFRICAN AMERICAN: 95 mL/min/{1.73_m2} (ref >59)
GFR, EST NON AFRICAN AMERICAN: 82 mL/min/{1.73_m2} (ref >59)
Glucose: 129 mg/dL — ABNORMAL HIGH (ref 65–99)
Potassium: 4.3 mmol/L (ref 3.5–5.2)
Sodium: 138 mmol/L (ref 134–144)

## 2017-06-05 ENCOUNTER — Ambulatory Visit (INDEPENDENT_AMBULATORY_CARE_PROVIDER_SITE_OTHER): Payer: Medicare Other | Admitting: *Deleted

## 2017-06-05 ENCOUNTER — Other Ambulatory Visit: Payer: Self-pay | Admitting: Nurse Practitioner

## 2017-06-05 DIAGNOSIS — I255 Ischemic cardiomyopathy: Secondary | ICD-10-CM

## 2017-06-06 ENCOUNTER — Ambulatory Visit (HOSPITAL_COMMUNITY)
Admission: RE | Admit: 2017-06-06 | Discharge: 2017-06-06 | Disposition: A | Discharge status: Home / Self Care | Payer: Medicare Other | Source: Ambulatory Visit | Attending: Cardiology | Admitting: Cardiology

## 2017-06-06 ENCOUNTER — Encounter (HOSPITAL_COMMUNITY)
Admission: RE | Disposition: A | Discharge status: Home / Self Care | Payer: Self-pay | Source: Ambulatory Visit | Attending: Cardiology

## 2017-06-06 ENCOUNTER — Encounter (HOSPITAL_COMMUNITY): Payer: Self-pay

## 2017-06-06 ENCOUNTER — Ambulatory Visit (HOSPITAL_COMMUNITY): Payer: Medicare Other | Admitting: Anesthesiology

## 2017-06-06 DIAGNOSIS — Z86010 Personal history of colonic polyps: Secondary | ICD-10-CM

## 2017-06-06 DIAGNOSIS — I447 Left bundle-branch block, unspecified: Secondary | ICD-10-CM | POA: Insufficient documentation

## 2017-06-06 DIAGNOSIS — I4891 Unspecified atrial fibrillation: Secondary | ICD-10-CM | POA: Diagnosis not present

## 2017-06-06 DIAGNOSIS — G4700 Insomnia, unspecified: Secondary | ICD-10-CM | POA: Insufficient documentation

## 2017-06-06 DIAGNOSIS — I252 Old myocardial infarction: Secondary | ICD-10-CM | POA: Insufficient documentation

## 2017-06-06 DIAGNOSIS — E785 Hyperlipidemia, unspecified: Secondary | ICD-10-CM

## 2017-06-06 DIAGNOSIS — K219 Gastro-esophageal reflux disease without esophagitis: Secondary | ICD-10-CM

## 2017-06-06 DIAGNOSIS — Z79899 Other long term (current) drug therapy: Secondary | ICD-10-CM | POA: Insufficient documentation

## 2017-06-06 DIAGNOSIS — I110 Hypertensive heart disease with heart failure: Secondary | ICD-10-CM | POA: Insufficient documentation

## 2017-06-06 DIAGNOSIS — I509 Heart failure, unspecified: Secondary | ICD-10-CM | POA: Insufficient documentation

## 2017-06-06 DIAGNOSIS — E119 Type 2 diabetes mellitus without complications: Secondary | ICD-10-CM

## 2017-06-06 DIAGNOSIS — I472 Ventricular tachycardia: Secondary | ICD-10-CM | POA: Diagnosis not present

## 2017-06-06 DIAGNOSIS — I2510 Atherosclerotic heart disease of native coronary artery without angina pectoris: Secondary | ICD-10-CM

## 2017-06-06 DIAGNOSIS — I481 Persistent atrial fibrillation: Secondary | ICD-10-CM

## 2017-06-06 DIAGNOSIS — Z95810 Presence of automatic (implantable) cardiac defibrillator: Secondary | ICD-10-CM | POA: Insufficient documentation

## 2017-06-06 DIAGNOSIS — Z7984 Long term (current) use of oral hypoglycemic drugs: Secondary | ICD-10-CM

## 2017-06-06 DIAGNOSIS — I255 Ischemic cardiomyopathy: Secondary | ICD-10-CM

## 2017-06-06 DIAGNOSIS — Z87891 Personal history of nicotine dependence: Secondary | ICD-10-CM

## 2017-06-06 HISTORY — PX: CARDIOVERSION: SHX1299

## 2017-06-06 SURGERY — CARDIOVERSION
Anesthesia: General

## 2017-06-06 MED ORDER — SODIUM CHLORIDE 0.9 % IV SOLN: INTRAVENOUS | Status: DC

## 2017-06-06 MED ORDER — SODIUM CHLORIDE 0.9 % IV SOLN
INTRAVENOUS | Status: DC
  Administered 2017-06-06: 13:00:00 via INTRAVENOUS

## 2017-06-06 NOTE — Progress Notes (Signed)
Remote ICD transmission.   

## 2017-06-06 NOTE — Discharge Instructions (Signed)
Electrical Cardioversion, Care After °This sheet gives you information about how to care for yourself after your procedure. Your health care provider may also give you more specific instructions. If you have problems or questions, contact your health care provider. °What can I expect after the procedure? °After the procedure, it is common to have: °· Some redness on the skin where the shocks were given. ° °Follow these instructions at home: °· Do not drive for 24 hours if you were given a medicine to help you relax (sedative). °· Take over-the-counter and prescription medicines only as told by your health care provider. °· Ask your health care provider how to check your pulse. Check it often. °· Rest for 48 hours after the procedure or as told by your health care provider. °· Avoid or limit your caffeine use as told by your health care provider. °Contact a health care provider if: °· You feel like your heart is beating too quickly or your pulse is not regular. °· You have a serious muscle cramp that does not go away. °Get help right away if: °· You have discomfort in your chest. °· You are dizzy or you feel faint. °· You have trouble breathing or you are short of breath. °· Your speech is slurred. °· You have trouble moving an arm or leg on one side of your body. °· Your fingers or toes turn cold or blue. °This information is not intended to replace advice given to you by your health care provider. Make sure you discuss any questions you have with your health care provider. °Document Released: 04/16/2013 Document Revised: 01/28/2016 Document Reviewed: 12/31/2015 °Elsevier Interactive Patient Education © 2018 [employer] ° °

## 2017-06-06 NOTE — Anesthesia Postprocedure Evaluation (Signed)
Anesthesia Post Note  Patient: [name]  Procedure(s) Performed: CARDIOVERSION (N/A )     Patient location during evaluation: PACU Anesthesia Type: General Level of consciousness: awake and alert Pain management: pain level controlled Vital Signs Assessment: post-procedure vital signs reviewed and stable Respiratory status: spontaneous breathing, nonlabored ventilation, respiratory function stable and patient connected to nasal cannula oxygen Cardiovascular status: blood pressure returned to baseline and stable Postop Assessment: no apparent nausea or vomiting Anesthetic complications: no    Last Vitals:  Vitals:   06/06/17 1332 06/06/17 1340  BP: (!) 113/58 118/86  Pulse: 93 89  Resp: (!) 25 16  Temp: 36.6 C   SpO2: 98% 97%    Last Pain: There were no vitals filed for this visit.               Ferdie Bakken

## 2017-06-06 NOTE — Interval H&P Note (Signed)
History and Physical Interval Note:  06/06/2017 1:15 PM  [name]  has presented today for surgery, with the diagnosis of A-FIB  The various methods of treatment have been discussed with the patient and family. After consideration of risks, benefits and other options for treatment, the patient has consented to  Procedure(s): CARDIOVERSION (N/A) as a surgical intervention .  The patient's history has been reviewed, patient examined, no change in status, stable for surgery.  I have reviewed the patient's chart and labs.  Questions were answered to the patient's satisfaction.     Arvin Abello [employer]

## 2017-06-06 NOTE — Procedures (Signed)
Electrical Cardioversion Procedure Note Anes Rigel [id number] [birth year]  Procedure: Electrical Cardioversion Indications:  Atrial Fibrillation  Procedure Details Consent: Risks of procedure as well as the alternatives and risks of each were explained to the (patient/caregiver).  Consent for procedure obtained. Time Out: Verified patient identification, verified procedure, site/side was marked, verified correct patient position, special equipment/implants available, medications/allergies/relevent history reviewed, required imaging and test results available.  Performed  Patient placed on cardiac monitor, pulse oximetry, supplemental oxygen as necessary.  Sedation given: Propofol per anesthesiology Pacer pads placed anterior and posterior chest.  Cardioverted 1 time(s).  Cardioverted at [place].  Evaluation Findings: Post procedure EKG shows: NSR Complications: None Patient did tolerate procedure well.   Loralie Champagne 06/06/2017, 1:22 PM

## 2017-06-06 NOTE — Transfer of Care (Signed)
Immediate Anesthesia Transfer of Care Note  Patient: [name]  Procedure(s) Performed: CARDIOVERSION (N/A )  Patient Location: Endoscopy Unit  Anesthesia Type:General  Level of Consciousness: awake, alert , oriented and patient cooperative  Airway & Oxygen Therapy: Patient Spontanous Breathing and Patient connected to nasal cannula oxygen  Post-op Assessment: Report given to RN and Post -op Vital signs reviewed and stable  Post vital signs: Reviewed and stable  Last Vitals:  Vitals:   06/06/17 1238  BP: 131/81  Pulse: 75  Resp: 17  SpO2: 97%    Last Pain: There were no vitals filed for this visit.       Complications: No apparent anesthesia complications

## 2017-06-06 NOTE — Anesthesia Preprocedure Evaluation (Addendum)
Anesthesia Evaluation    Airway Mallampati: II  TM Distance: >3 FB Neck ROM: Full    Dental no notable dental hx.    Pulmonary former smoker,    Pulmonary exam normal breath sounds clear to auscultation       Cardiovascular hypertension, Pt. on medications and Pt. on home beta blockers + CAD and +CHF  Normal cardiovascular exam+ dysrhythmias Atrial Fibrillation  Rhythm:Regular Rate:Normal     Neuro/Psych    GI/Hepatic GERD  ,  Endo/Other  diabetes  Renal/GU      Musculoskeletal   Abdominal   Peds  Hematology   Anesthesia Other Findings   Reproductive/Obstetrics                             Anesthesia Physical Anesthesia Plan  ASA: IV  Anesthesia Plan: MAC   Post-op Pain Management:    Induction: Intravenous  PONV Risk Score and Plan: 1 and Treatment may vary due to age or medical condition  Airway Management Planned: Mask, Natural Airway and Nasal Cannula  Additional Equipment:   Intra-op Plan:   Post-operative Plan:   Informed Consent:   Plan Discussed with:   Anesthesia Plan Comments:         Anesthesia Quick Evaluation

## 2017-06-07 ENCOUNTER — Encounter (HOSPITAL_COMMUNITY): Payer: Self-pay | Admitting: Cardiology

## 2017-06-07 ENCOUNTER — Telehealth: Payer: Self-pay | Admitting: Internal Medicine

## 2017-06-07 ENCOUNTER — Inpatient Hospital Stay (HOSPITAL_COMMUNITY)
Admission: EM | Admit: 2017-06-07 | Discharge: 2017-06-11 | DRG: 309 | Disposition: A | Discharge status: Home / Self Care | Payer: Medicare Other | Attending: Internal Medicine | Admitting: Internal Medicine

## 2017-06-07 ENCOUNTER — Other Ambulatory Visit: Payer: Self-pay

## 2017-06-07 ENCOUNTER — Emergency Department (HOSPITAL_COMMUNITY): Payer: Medicare Other

## 2017-06-07 DIAGNOSIS — Z7982 Long term (current) use of aspirin: Secondary | ICD-10-CM

## 2017-06-07 DIAGNOSIS — Z95810 Presence of automatic (implantable) cardiac defibrillator: Secondary | ICD-10-CM

## 2017-06-07 DIAGNOSIS — Z87891 Personal history of nicotine dependence: Secondary | ICD-10-CM | POA: Diagnosis not present

## 2017-06-07 DIAGNOSIS — E785 Hyperlipidemia, unspecified: Secondary | ICD-10-CM | POA: Diagnosis present

## 2017-06-07 DIAGNOSIS — I110 Hypertensive heart disease with heart failure: Secondary | ICD-10-CM | POA: Diagnosis present

## 2017-06-07 DIAGNOSIS — Z7901 Long term (current) use of anticoagulants: Secondary | ICD-10-CM | POA: Diagnosis not present

## 2017-06-07 DIAGNOSIS — I481 Persistent atrial fibrillation: Secondary | ICD-10-CM | POA: Diagnosis present

## 2017-06-07 DIAGNOSIS — I472 Ventricular tachycardia, unspecified: Secondary | ICD-10-CM

## 2017-06-07 DIAGNOSIS — Z86010 Personal history of colonic polyps: Secondary | ICD-10-CM

## 2017-06-07 DIAGNOSIS — Z888 Allergy status to other drugs, medicaments and biological substances status: Secondary | ICD-10-CM | POA: Diagnosis not present

## 2017-06-07 DIAGNOSIS — Z955 Presence of coronary angioplasty implant and graft: Secondary | ICD-10-CM | POA: Diagnosis not present

## 2017-06-07 DIAGNOSIS — K219 Gastro-esophageal reflux disease without esophagitis: Secondary | ICD-10-CM | POA: Diagnosis present

## 2017-06-07 DIAGNOSIS — G4700 Insomnia, unspecified: Secondary | ICD-10-CM | POA: Diagnosis present

## 2017-06-07 DIAGNOSIS — F419 Anxiety disorder, unspecified: Secondary | ICD-10-CM | POA: Diagnosis present

## 2017-06-07 DIAGNOSIS — Z79899 Other long term (current) drug therapy: Secondary | ICD-10-CM

## 2017-06-07 DIAGNOSIS — I255 Ischemic cardiomyopathy: Secondary | ICD-10-CM | POA: Diagnosis present

## 2017-06-07 DIAGNOSIS — I2510 Atherosclerotic heart disease of native coronary artery without angina pectoris: Secondary | ICD-10-CM | POA: Diagnosis present

## 2017-06-07 DIAGNOSIS — I447 Left bundle-branch block, unspecified: Secondary | ICD-10-CM | POA: Diagnosis present

## 2017-06-07 DIAGNOSIS — M2550 Pain in unspecified joint: Secondary | ICD-10-CM | POA: Diagnosis present

## 2017-06-07 DIAGNOSIS — I480 Paroxysmal atrial fibrillation: Secondary | ICD-10-CM | POA: Diagnosis not present

## 2017-06-07 DIAGNOSIS — E119 Type 2 diabetes mellitus without complications: Secondary | ICD-10-CM | POA: Diagnosis present

## 2017-06-07 DIAGNOSIS — I5022 Chronic systolic (congestive) heart failure: Secondary | ICD-10-CM | POA: Diagnosis present

## 2017-06-07 DIAGNOSIS — I5023 Acute on chronic systolic (congestive) heart failure: Secondary | ICD-10-CM | POA: Diagnosis not present

## 2017-06-07 DIAGNOSIS — I4729 Other ventricular tachycardia: Secondary | ICD-10-CM

## 2017-06-07 DIAGNOSIS — Z23 Encounter for immunization: Secondary | ICD-10-CM | POA: Diagnosis present

## 2017-06-07 DIAGNOSIS — I361 Nonrheumatic tricuspid (valve) insufficiency: Secondary | ICD-10-CM | POA: Diagnosis not present

## 2017-06-07 LAB — CBC
HEMATOCRIT: 43.7 % (ref 39.0–52.0)
Hemoglobin: 14 g/dL (ref 13.0–17.0)
MCH: 28.5 pg (ref 26.0–34.0)
MCHC: 32 g/dL (ref 30.0–36.0)
MCV: 88.8 fL (ref 78.0–100.0)
PLATELETS: 224 10*3/uL (ref 150–400)
RBC: 4.92 MIL/uL (ref 4.22–5.81)
RDW: 14.7 % (ref 11.5–15.5)
WBC: 7.7 10*3/uL (ref 4.0–10.5)

## 2017-06-07 LAB — BASIC METABOLIC PANEL
Anion gap: 9 (ref 5–15)
BUN: 16 mg/dL (ref 6–20)
CO2: 21 mmol/L — ABNORMAL LOW (ref 22–32)
CREATININE: 1 mg/dL (ref 0.61–1.24)
Calcium: 9.1 mg/dL (ref 8.9–10.3)
Chloride: 106 mmol/L (ref 101–111)
GFR calc Af Amer: >60 mL/min (ref >60)
Glucose, Bld: 160 mg/dL — ABNORMAL HIGH (ref 65–99)
Potassium: 4.1 mmol/L (ref 3.5–5.1)
Sodium: 136 mmol/L (ref 135–145)

## 2017-06-07 LAB — POCT I-STAT 4, (NA,K, GLUC, HGB,HCT)
Glucose, Bld: 152 mg/dL — ABNORMAL HIGH (ref 65–99)
HCT: 44 % (ref 39.0–52.0)
HEMOGLOBIN: 15 g/dL (ref 13.0–17.0)
Potassium: 4.6 mmol/L (ref 3.5–5.1)
SODIUM: 140 mmol/L (ref 135–145)

## 2017-06-07 LAB — MRSA PCR SCREENING: MRSA by PCR: NEGATIVE

## 2017-06-07 MED ORDER — ASPIRIN 81 MG PO CHEW
324.0000 mg | CHEWABLE_TABLET | ORAL | Status: AC
  Administered 2017-06-07: 324 mg via ORAL
  Filled 2017-06-07: qty 4

## 2017-06-07 MED ORDER — PNEUMOCOCCAL VAC POLYVALENT 25 MCG/0.5ML IJ INJ: 0.5000 mL | INJECTION | INTRAMUSCULAR | Status: DC

## 2017-06-07 MED ORDER — APIXABAN 5 MG PO TABS
5.0000 mg | ORAL_TABLET | Freq: Two times a day (BID) | ORAL | Status: DC
  Administered 2017-06-07: 5 mg via ORAL
  Filled 2017-06-07: qty 1

## 2017-06-07 MED ORDER — NITROGLYCERIN 0.4 MG SL SUBL
0.4000 mg | SUBLINGUAL_TABLET | SUBLINGUAL | Status: DC | PRN
Start: 2017-06-07 — End: 2017-06-07

## 2017-06-07 MED ORDER — INFLUENZA VAC SPLIT QUAD 0.5 ML IM SUSY
0.5000 mL | PREFILLED_SYRINGE | INTRAMUSCULAR | Status: AC | PRN
  Administered 2017-06-11: 0.5 mL via INTRAMUSCULAR
  Filled 2017-06-07: qty 0.5

## 2017-06-07 MED ORDER — ACETAMINOPHEN 325 MG PO TABS: 650.0000 mg | ORAL_TABLET | ORAL | Status: DC | PRN

## 2017-06-07 MED ORDER — AMIODARONE HCL IN DEXTROSE 360-4.14 MG/200ML-% IV SOLN
30.0000 mg/h | INTRAVENOUS | Status: DC
  Administered 2017-06-07 – 2017-06-08 (×2): 30 mg/h via INTRAVENOUS
  Filled 2017-06-07 (×2): qty 200

## 2017-06-07 MED ORDER — HEPARIN (PORCINE) IN NACL 100-0.45 UNIT/ML-% IJ SOLN
1200.0000 [IU]/h | INTRAMUSCULAR | Status: DC
  Administered 2017-06-08: 1200 [IU]/h via INTRAVENOUS
  Filled 2017-06-07: qty 250

## 2017-06-07 MED ORDER — AMIODARONE HCL IN DEXTROSE 360-4.14 MG/200ML-% IV SOLN
60.0000 mg/h | INTRAVENOUS | Status: AC
  Administered 2017-06-07: 60 mg/h via INTRAVENOUS
  Filled 2017-06-07 (×2): qty 200

## 2017-06-07 MED ORDER — AMIODARONE LOAD VIA INFUSION
150.0000 mg | Freq: Once | INTRAVENOUS | Status: AC
  Administered 2017-06-07: 150 mg via INTRAVENOUS
  Filled 2017-06-07: qty 83.34

## 2017-06-07 MED ORDER — ACETAMINOPHEN 500 MG PO TABS: 1000.0000 mg | ORAL_TABLET | Freq: Every day | ORAL | Status: DC | PRN

## 2017-06-07 MED ORDER — PANTOPRAZOLE SODIUM 40 MG PO TBEC
40.0000 mg | DELAYED_RELEASE_TABLET | Freq: Every day | ORAL | Status: DC
  Administered 2017-06-08 – 2017-06-11 (×4): 40 mg via ORAL
  Filled 2017-06-07 (×4): qty 1

## 2017-06-07 MED ORDER — ASPIRIN 300 MG RE SUPP: 300.0000 mg | RECTAL | Status: AC

## 2017-06-07 MED ORDER — ONDANSETRON HCL 4 MG/2ML IJ SOLN: 4.0000 mg | Freq: Four times a day (QID) | INTRAMUSCULAR | Status: DC | PRN

## 2017-06-07 MED ORDER — METOPROLOL SUCCINATE ER 100 MG PO TB24
100.0000 mg | ORAL_TABLET | Freq: Every day | ORAL | Status: DC
  Administered 2017-06-07 – 2017-06-11 (×5): 100 mg via ORAL
  Filled 2017-06-07: qty 2
  Filled 2017-06-07: qty 1
  Filled 2017-06-07: qty 2
  Filled 2017-06-07: qty 1
  Filled 2017-06-07: qty 2

## 2017-06-07 MED ORDER — RAMIPRIL 10 MG PO CAPS
10.0000 mg | ORAL_CAPSULE | Freq: Every day | ORAL | Status: DC
  Administered 2017-06-07 – 2017-06-08 (×2): 10 mg via ORAL
  Filled 2017-06-07 (×2): qty 1

## 2017-06-07 MED ORDER — FUROSEMIDE 40 MG PO TABS
40.0000 mg | ORAL_TABLET | Freq: Every morning | ORAL | Status: DC
  Administered 2017-06-07 – 2017-06-11 (×5): 40 mg via ORAL
  Filled 2017-06-07 (×2): qty 1
  Filled 2017-06-07: qty 2
  Filled 2017-06-07 (×3): qty 1

## 2017-06-07 MED ORDER — NITROGLYCERIN 0.4 MG SL SUBL: 0.4000 mg | SUBLINGUAL_TABLET | SUBLINGUAL | Status: DC | PRN

## 2017-06-07 MED ORDER — SPIRONOLACTONE 25 MG PO TABS
25.0000 mg | ORAL_TABLET | Freq: Every day | ORAL | Status: DC
  Administered 2017-06-09 – 2017-06-11 (×3): 25 mg via ORAL
  Filled 2017-06-07 (×4): qty 1

## 2017-06-07 NOTE — Telephone Encounter (Signed)
Scott Dupont, RN with [facility] was able to reach the patient. Patient was instructed to send in a transmission that showed the patient was still abnormal. Patient told her that he felt okay. Patient was instructed to go to the ER given his rhythm and that he should not drive. Patient states that he is going to have his neighbor take him. Called Trish at the hospital and made her aware that he is on his way.

## 2017-06-07 NOTE — Telephone Encounter (Signed)
[name] vis calling because he had a cardioversion on yesterday and last night his heart was racing all night . His pulse was running from 158-170 .Marland KitchenNot Sure if this is normal after a cardioversion . Please call

## 2017-06-07 NOTE — Progress Notes (Signed)
ANTICOAGULATION CONSULT NOTE - Initial Consult  Pharmacy Consult for Heparin Indication: chest pain/ACS  Allergies  Allergen Reactions  . Coreg [Carvedilol] Diarrhea  . Entresto [Sacubitril-Valsartan] Other (See Comments)    Joint pain  . Jardiance [Empagliflozin] Other (See Comments)    Possible afib, definitely cost  . Lipitor [Atorvastatin] Diarrhea    Patient Measurements: Height: 6\' 4"  (193 cm) Weight: 230 lb (104.3 kg) IBW/kg (Calculated) : 86.8 Heparin Dosing Weight: 104 kg   Vital Signs: Temp: 97.7 F (36.5 C) (11/29 1053) Temp Source: Oral (11/29 1053) BP: 120/86 (11/29 1400) Pulse Rate: 83 (11/29 1400)  Labs: Recent Labs    06/06/17 1251 06/07/17 1052  HGB 15.0 14.0  HCT 44.0 43.7  PLT  --  224  CREATININE  --  1.00    Estimated Creatinine Clearance: 102.9 mL/min (by C-G formula based on SCr of 1 mg/dL).   Medical History: Past Medical History:  Diagnosis Date  . CAD (coronary artery disease) last 2005   multiple MIs  . CHF (congestive heart failure) (HCC)    secondary to severe ischemic cardiomyopathy.   a.    EF 20% with akinesis of the distal half to two-thirds of the ventricle.  Trivial MR   b.     Status post bi-V ICD.   c.     Cardiopulmonary exercise test (12/11) pVO2 of 24.5,VE/VCO2 slope is 31,.7  . Colon polyps   . Diabetes mellitus   . Diverticulosis   . GERD (gastroesophageal reflux disease)   . Headache(784.0)   . Hemorrhoids   . Hyperlipidemia   . 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 Insomnia   . Left bundle branch block   . Persistent atrial fibrillation ([place])   . Prostate neoplasm   . Tinnitus    comes and goes    Medications:   (Not in a hospital admission)  Assessment: 51 YOM with h/o of Afib s/p DCCV on 06/06/2017 seen today for evaluation of VT. Patient is on apixaban at home and given a dose in the ED today at 1250. Pharmacy consulted to start IV heparin per EP. H/H  and Plt wnl.   Goal of Therapy:  Heparin level 0.3-0.7 units/ml  APTT 66-102s  Monitor platelets by anticoagulation protocol: Yes   Plan:  -Start IV heparin at 1200 units/hr at 0100 on 11/30 (12 hours after apixaban dose). No heparin bolus -D/c apixaban  -F/u 6 hr HL/aPTT level -Monitor daily aPTT, HL, CBC and s/s of bleeding    Albertina Parr, PharmD., BCPS Clinical Pharmacist Pager [phone]

## 2017-06-07 NOTE — Telephone Encounter (Signed)
Transmission received via [employer] home monitor at 4am 06/07/17, presenting rhythm VT @ approx. 165bpm.  Called Mr. Lazenby- he reports he feels like his heart is still racing but he's alert, he is concerned that his defibrillator is going to fire. He sent a repeat transmission while on the phone with me- transmission received, pt still in VT.  I have advised him to proceed to the ER, not to drive himself. He reports that he will see if his neighbor can take him, he would like to avoid the ambulance bill. I advised him to call 911 if he starts to feel light-headed/dizzy or receives a shock. He verbalizes understanding and is appreciative of call.  Drue Novel will contact Trish @ [facility] to alert her of patient's impending arrival.

## 2017-06-07 NOTE — ED Notes (Signed)
Pharmacy notified for need of eliquis

## 2017-06-07 NOTE — H&P (Signed)
ELECTROPHYSIOLOGY HISTORY AND PHYSICAL    Patient ID: [name] MRN: [id number], DOB/AGE: 62-13-1957 62 y.o.  Admit date: 06/07/2017 Date of Consult: 06/07/2017  Primary Physician: Marton Redwood, MD Primary Cardiologist: [place] Electrophysiologist: Caryl Comes  Patient Profile: [name] is a 62 y.o. male with a history of ICM, CHF, atrial fibrillation status post DCCV 06/06/17 who is being seen today for the evaluation of VT at the request of the ER MD.  HPI:  [name] is a 61 y.o. male with a past medical history as outlined above. He underwent DCCV yesterday for persistent AF and worsening HF symptoms.  He went home and did well initially but then developed tachypalpitations that sustained on and off through the night.  He was concerned his ICD was going to fire. He did not have chest pain, shortness of breath, dizziness, or syncope.  He called the office this morning and manual transmission demonstrated VT. He was advised to go to the ER for further evaluation.  On arrival, he was in VT that was hemodynamically stable. He has had persistent sustained VT with intermittent SR and CRT pacing as well.  He has been compliant with Eliquis and other home medications. He has significant anxiety related to prior inappropriate therapy for AF with RVR.   He denies chest pain, palpitations, dyspnea, PND, orthopnea, nausea, vomiting, dizziness, syncope, edema, weight gain, or early satiety.  Past Medical History:  Diagnosis Date  . CAD (coronary artery disease) last 2005   multiple MIs  . CHF (congestive heart failure) (HCC)    secondary to severe ischemic cardiomyopathy.   a.    EF 20% with akinesis of the distal half to two-thirds of the ventricle.  Trivial MR   b.     Status post bi-V ICD.   c.     Cardiopulmonary exercise test (12/11) pVO2 of 24.5,VE/VCO2 slope is 31,.7  . Colon polyps   . Diabetes mellitus   . Diverticulosis   . GERD (gastroesophageal reflux disease)   .  Headache(784.0)   . Hemorrhoids   . Hyperlipidemia   . 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 Insomnia   . Left bundle branch block   . Persistent atrial fibrillation ([place])   . Prostate neoplasm   . Tinnitus    comes and goes     Surgical History:  Past Surgical History:  Procedure Laterality Date  . CARDIAC CATHETERIZATION  2005   stent x 1 placed  . CARDIOVERSION N/A 06/06/2017   Procedure: CARDIOVERSION;  Surgeon: Larey Dresser, MD;  Location: [facility] ENDOSCOPY;  Service: Cardiovascular;  Laterality: N/A;  . colonscopy     x 2  . dual chamber defibrillator  06/15/2004  . HEMORRHOID BANDING  09-2012  . single-chamber ICD implantation  08/10/2003  . TONSILLECTOMY  age 4     Current Outpatient Medications:  .  acetaminophen (TYLENOL) 500 MG tablet, Take 1,000 mg by mouth daily as needed for moderate pain., Disp: , Rfl:  .  apixaban (ELIQUIS) 5 MG TABS tablet, Take 1 tablet (5 mg total) 2 (two) times daily by mouth., Disp: 60 tablet, Rfl: 5 .  aspirin EC 81 MG tablet, Take 81 mg by mouth daily., Disp: , Rfl:  .  DiphenhydrAMINE HCl (ZZZQUIL) 50 MG/30ML LIQD, Take 35 mg by mouth at bedtime as needed (sleep)., Disp: , Rfl:  .  esomeprazole (NEXIUM) 20 MG capsule, Take 20 mg by mouth daily. , Disp: ,  Rfl:  .  furosemide (LASIX) 40 MG tablet, Take 1 tablet (40 mg total) by mouth every morning., Disp: 90 tablet, Rfl: 3 .  glimepiride (AMARYL) 4 MG tablet, Take 4 mg by mouth every evening. , Disp: , Rfl:  .  loperamide (IMODIUM A-D) 2 MG tablet, Take 4 mg by mouth daily as needed for diarrhea or loose stools., Disp: , Rfl:  .  metoprolol succinate (TOPROL-XL) 100 MG 24 hr tablet, Take 1 tablet (100 mg total) by mouth daily. Take with or immediately following a meal., Disp: 90 tablet, Rfl: 3 .  nitroGLYCERIN (NITROSTAT) 0.4 MG SL tablet, Place 1 tablet (0.4 mg total) under the tongue every 5 (five) minutes as needed for chest pain., Disp: 25  tablet, Rfl: 1 .  ramipril (ALTACE) 10 MG capsule, Take 1 capsule (10 mg total) by mouth daily., Disp: 90 capsule, Rfl: 3 .  spironolactone (ALDACTONE) 25 MG tablet, TAKE ONE TABLET BY MOUTH ONCE DAILY IN THE MORNING, Disp: 90 tablet, Rfl: 3 .  Tetrahydrozoline HCl (VISINE OP), Apply 3 drops to eye daily as needed (dry eyes)., Disp: , Rfl:  .  zolpidem (AMBIEN CR) 12.5 MG CR tablet, Take 12.5 mg by mouth at bedtime. , Disp: , Rfl:    Inpatient Medications: . amiodarone  150 mg Intravenous Once  . apixaban  5 mg Oral BID    Allergies:  Allergies  Allergen Reactions  . Coreg [Carvedilol] Diarrhea  . Entresto [Sacubitril-Valsartan] Other (See Comments)    Joint pain  . Lipitor [Atorvastatin] Diarrhea    Social History   Socioeconomic History  . Marital status: Single    Spouse name: Not on file  . Number of children: Not on file  . Years of education: Not on file  . Highest education level: Not on file  Social Needs  . Financial resource strain: Not on file  . Food insecurity - worry: Not on file  . Food insecurity - inability: Not on file  . Transportation needs - medical: Not on file  . Transportation needs - non-medical: Not on file  Occupational History  . Not on file  Tobacco Use  . Smoking status: Former Smoker    Packs/day: 2.00    Years: 30.00    Pack years: 60.00    Types: Cigarettes    Last attempt to quit: 07/20/2003    Years since quitting: 13.8  . Smokeless tobacco: Never Used  Substance and Sexual Activity  . Alcohol use: Yes    Comment: Occasional.  . 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 Sexual activity: Not on file  Other Topics Concern  . Not on file  Social History Narrative  . Not on file     Family History  Problem Relation Age of Onset  . Coronary artery disease Unknown      Review of Systems: All other systems reviewed and are otherwise negative except as noted above.  Physical Exam: Vitals:    06/07/17 1134 06/07/17 1145 06/07/17 1200 06/07/17 1215  BP: 128/89 115/87 126/84 122/81  Pulse: 97 96 99 97  Resp: 13 15 13 18   Temp:      TempSrc:      SpO2: 98% 97% 97% 96%  Weight:      Height:        GEN- The patient is well appearing, alert and oriented x 3 today.   HEENT: normocephalic, atraumatic; sclera clear, conjunctiva  pink; hearing intact; oropharynx clear; neck supple Lungs- Clear to ausculation bilaterally, normal work of breathing.  No wheezes, rales, rhonchi Heart- Regular rate and rhythm  GI- soft, non-tender, non-distended, bowel sounds present Extremities- no clubbing, cyanosis, or edema  MS- no significant deformity or atrophy Skin- warm and dry, no rash or lesion Psych- euthymic mood, full affect Neuro- strength and sensation are intact  Labs:  Lab Results  Component Value Date   WBC 7.7 06/07/2017   HGB 14.0 06/07/2017   HCT 43.7 06/07/2017   MCV 88.8 06/07/2017   PLT 224 06/07/2017   Recent Labs  Lab 06/07/17 1052  NA 136  K 4.1  CL 106  CO2 21*  BUN 16  CREATININE 1.00  CALCIUM 9.1  GLUCOSE 160*      Radiology/Studies: Dg Chest 2 View  Result Date: 06/07/2017 CLINICAL DATA:  The patient developed a sensation of tachycardia during the night. He underwent cardioversion for atrial fibrillation yesterday. EXAM: CHEST  2 VIEW COMPARISON:  PA and lateral chest 01/25/2010.  CT chest 01/26/2017. FINDINGS: AICD is in place, unchanged. Heart size is normal. Calcification in the left ventricular wall is better visualized on prior CT. Lungs are clear. No pneumothorax or pleural effusion. Aortic atherosclerosis is noted. No acute bony abnormality. IMPRESSION: No acute disease. Atherosclerosis. Electronically Signed   By: Inge Rise M.D.   On: 06/07/2017 11:30    EKG:VT (personally reviewed)  TELEMETRY: VT with intermittent SR with V pacing (personally reviewed)  Device History: BSX CRTD implanted 2005 for ICM, CHF; gen change  2011  Assessment/Plan: 1.  Sustained hemodynamically VT Sylvanna Burggraf start IV amiodarone now and continue for at least 24 hours He would likely benefit from repeat ischemic evaluation - this is complicated by cardioversion yesterday and need to continue [place] uninterrupted for 4 weeks VT is below detection - device NOT reprogrammed today 2/2 concerns for ICD shocks and his VT is hemodynamically stable.  Keep K >3.9, Mg >1.8 No driving x6 months  2.  Persistent atrial fibrillation S/p DCCV 06/06/17 CHADS2VASC is at least 3 Montey Ebel change Eliquis to Heparin for now  3.  HTN Stable No change required today  4.  ICM/chronic systolic heart failure Euvolemic on exam No recent ischemic symptoms Continue current therapy   Plan - admit to ICU with IV amiodarone.   Signed, Chanetta Marshall 06/07/2017 12:24 PM      I have seen and examined this patient with Chanetta Marshall.  Agree with above, note added to reflect my findings.  On exam, RRR, no murmurs, lungs clear.  Patient presented to the hospital with rapid palpitations, found to be in ventricular tachycardia.  Did have cardioversion for atrial flutter performed yesterday.  Patient is in and out of VT in the emergency room.  Device interrogation shows stable leads.  Due to his VT, we Benjaman Artman plan to start him on amiodarone.  We Avagrace Botelho plan to admit to the ICU today.  He would likely benefit from an ischemic workup, though with recent cardioversion, would need to be off of his Eliquis.  We Lennon Boutwell plan to start heparin today.  Kirat Mezquita M. Andelyn Spade MD 06/07/2017 3:56 PM

## 2017-06-07 NOTE — ED Provider Notes (Signed)
[facility] EMERGENCY DEPARTMENT Provider Note   CSN: [id number] Arrival date & time: 06/07/17  1035     History   Chief Complaint No chief complaint on file.   HPI [name] is a 61 y.o. male.  61yo M w/ PMH including CAD, CHF, pacemaker/AICD, A fib on Eliquis who p/w palpitations.  Yesterday the patient had an elective cardioversion by the cardiology team and was discharged home.  Around 3:57 PM yesterday, he began feeling heart racing sensation similar to previous episodes of atrial fibrillation.  He states he was up for the most of the night due to ongoing symptoms.  He denies any actual chest pain with it.  He has felt some shortness of breath.  He denies any associated nausea or vomiting but has had some diaphoresis.  He is compliant with medications, has not had his morning dose of Eliquis yet.   The history is provided by the patient.    Past Medical History:  Diagnosis Date  . CAD (coronary artery disease) last 2005   multiple MIs  . CHF (congestive heart failure) (HCC)    secondary to severe ischemic cardiomyopathy.   a.    EF 20% with akinesis of the distal half to two-thirds of the ventricle.  Trivial MR   b.     Status post bi-V ICD.   c.     Cardiopulmonary exercise test (12/11) pVO2 of 24.5,VE/VCO2 slope is 31,.7  . Colon polyps   . Diabetes mellitus   . Diverticulosis   . GERD (gastroesophageal reflux disease)   . Headache(784.0)   . Hemorrhoids   . Hyperlipidemia   . 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 Insomnia   . Left bundle branch block   . Persistent atrial fibrillation ([place])   . Prostate neoplasm   . Tinnitus    comes and goes    Patient Active Problem List   Diagnosis Date Noted  . VT (ventricular tachycardia) ([place]) 06/07/2017  . Coronary atherosclerosis of native coronary artery 11/12/2013  . PTSD (post-traumatic stress disorder) 02/10/2011  . Biventricular implantable  cardioverter-defibrillator-CRT  02/10/2011  . SHORTNESS OF BREATH 01/25/2010  . Hyperlipidemia 12/02/2008  . Ischemic cardiomyopathy 12/02/2008  . SYSTOLIC HEART FAILURE, CHRONIC 12/02/2008    Past Surgical History:  Procedure Laterality Date  . CARDIAC CATHETERIZATION  2005   stent x 1 placed  . CARDIOVERSION N/A 06/06/2017   Procedure: CARDIOVERSION;  Surgeon: Larey Dresser, MD;  Location: [facility]OSCOPY;  Service: Cardiovascular;  Laterality: N/A;  . colonscopy     x 2  . dual chamber defibrillator  06/15/2004  . HEMORRHOID BANDING  09-2012  . single-chamber ICD implantation  08/10/2003  . TONSILLECTOMY  age 32       Home Medications    Prior to Admission medications   Medication Sig Start Date End Date Taking? Authorizing Provider  apixaban (ELIQUIS) 5 MG TABS tablet Take 1 tablet (5 mg total) 2 (two) times daily by mouth. 05/16/17  Yes Seiler, Amber K, NP  DiphenhydrAMINE HCl (ZZZQUIL) 50 MG/30ML LIQD Take 35 mg by mouth at bedtime as needed (sleep).   Yes [provider]  esomeprazole (NEXIUM) 20 MG capsule Take 20 mg by mouth daily.    Yes [provider]  furosemide (LASIX) 40 MG tablet Take 1 tablet (40 mg total) by mouth every morning. 07/05/16  Yes Larey Dresser, MD  glimepiride (AMARYL) 4 MG tablet  Take 4 mg by mouth every evening.  05/09/17  Yes [provider]  metoprolol succinate (TOPROL-XL) 100 MG 24 hr tablet Take 1 tablet (100 mg total) by mouth daily. Take with or immediately following a meal. 06/06/16 06/07/17 Yes Deboraha Sprang, MD  ramipril (ALTACE) 10 MG capsule Take 1 capsule (10 mg total) by mouth daily. 06/12/16  Yes Deboraha Sprang, MD  spironolactone (ALDACTONE) 25 MG tablet TAKE ONE TABLET BY MOUTH ONCE DAILY IN THE MORNING 07/05/16  Yes Larey Dresser, MD  Tetrahydrozoline HCl (VISINE OP) Apply 3 drops to eye daily as needed (dry eyes).   Yes [provider]  zolpidem (AMBIEN CR) 12.5 MG CR tablet Take 12.5 mg  by mouth at bedtime.    Yes [provider]  acetaminophen (TYLENOL) 500 MG tablet Take 1,000 mg by mouth daily as needed for moderate pain.    [provider]  loperamide (IMODIUM A-D) 2 MG tablet Take 4 mg by mouth daily as needed for diarrhea or loose stools.    [provider]  nitroGLYCERIN (NITROSTAT) 0.4 MG SL tablet Place 1 tablet (0.4 mg total) under the tongue every 5 (five) minutes as needed for chest pain. 04/13/14   Deboraha Sprang, MD    Family History Family History  Problem Relation Age of Onset  . Coronary artery disease Unknown     Social History Social History   Tobacco Use  . Smoking status: Former Smoker    Packs/day: 2.00    Years: 30.00    Pack years: 60.00    Types: Cigarettes    Last attempt to quit: 07/20/2003    Years since quitting: 13.8  . Smokeless tobacco: Never Used  Substance Use Topics  . Alcohol use: Yes    Comment: Occasional.  . Drug use: Yes    Comment: admits to cocaine in the early 1980s and not since     Allergies   Coreg [carvedilol]; Entresto [sacubitril-valsartan]; Jardiance [empagliflozin]; and Lipitor [atorvastatin]   Review of Systems Review of Systems All other systems reviewed and are negative except that which was mentioned in HPI   Physical Exam Updated Vital Signs BP 109/63   Pulse 76   Temp 97.7 F (36.5 C) (Oral)   Resp 13   Ht 6\' 4"  (1.93 m)   Wt 104.3 kg (230 lb)   SpO2 97%   BMI 28.00 kg/m   Physical Exam  Constitutional: He is oriented to person, place, and time. He appears well-developed and well-nourished. No distress.  HENT:  Head: Normocephalic and atraumatic.  Moist mucous membranes  Eyes: Conjunctivae are normal. Pupils are equal, round, and reactive to light.  Neck: Neck supple.  Cardiovascular: Regular rhythm and normal heart sounds. Tachycardia present.  No murmur heard. Pulmonary/Chest: Effort normal and breath sounds normal.  Abdominal: Soft. Bowel sounds are  normal. He exhibits no distension. There is no tenderness.  Musculoskeletal: He exhibits no edema.  Neurological: He is alert and oriented to person, place, and time.  Fluent speech  Skin: Skin is warm and dry.  Psychiatric: He has a normal mood and affect. Judgment normal.  Nursing note and vitals reviewed.    ED Treatments / Results  Labs (all labs ordered are listed, but only abnormal results are displayed) Labs Reviewed  BASIC METABOLIC PANEL - Abnormal; Notable for the following components:      Result Value   CO2 21 (*)    Glucose, Bld 160 (*)  All other components within normal limits  CBC  HIV ANTIBODY (ROUTINE TESTING)  BASIC METABOLIC PANEL  CBC  I-STAT TROPONIN, ED    EKG  EKG Interpretation  Date/Time:  Thursday June 07 2017 10:39:14 EST Ventricular Rate:  102 PR Interval:  100 QRS Duration: 186 QT Interval:  430 QTC Calculation: 560 R Axis:   -97 Text Interpretation:  Atrial-sensed ventricular-paced rhythm Biventricular pacemaker detected Abnormal ECG Otherwise no significant change Confirmed by Addison Lank [id number]) on 06/07/2017 10:45:26 AM Also confirmed by Addison Lank [phone]), editor Laurena Spies [phone])  on 06/07/2017 11:12:18 AM       Radiology Dg Chest 2 View  Result Date: 06/07/2017 CLINICAL DATA:  The patient developed a sensation of tachycardia during the night. He underwent cardioversion for atrial fibrillation yesterday. EXAM: CHEST  2 VIEW COMPARISON:  PA and lateral chest 01/25/2010.  CT chest 01/26/2017. FINDINGS: AICD is in place, unchanged. Heart size is normal. Calcification in the left ventricular wall is better visualized on prior CT. Lungs are clear. No pneumothorax or pleural effusion. Aortic atherosclerosis is noted. No acute bony abnormality. IMPRESSION: No acute disease. Atherosclerosis. Electronically Signed   By: Inge Rise M.D.   On: 06/07/2017 11:30    Procedures .Critical Care Performed by: Sharlett Iles, MD Authorized by: Sharlett Iles, MD   Critical care provider statement:    Critical care time (minutes):  45   Critical care time was exclusive of:  Separately billable procedures and treating other patients   Critical care was necessary to treat or prevent imminent or life-threatening deterioration of the following conditions:  Cardiac failure   Critical care was time spent personally by me on the following activities:  Development of treatment plan with patient or surrogate, discussions with consultants, evaluation of patient's response to treatment, examination of patient, obtaining history from patient or surrogate, ordering and performing treatments and interventions, ordering and review of laboratory studies, ordering and review of radiographic studies, re-evaluation of patient's condition and review of old charts   (including critical care time)  Medications Ordered in ED Medications  amiodarone (NEXTERONE) 1.8 mg/mL load via infusion 150 mg (150 mg Intravenous Bolus from Bag 06/07/17 1235)    Followed by  amiodarone (NEXTERONE PREMIX) 360-4.14 MG/200ML-% (1.8 mg/mL) IV infusion (60 mg/hr Intravenous Transfusing/Transfer 06/07/17 1708)    Followed by  amiodarone (NEXTERONE PREMIX) 360-4.14 MG/200ML-% (1.8 mg/mL) IV infusion (not administered)  nitroGLYCERIN (NITROSTAT) SL tablet 0.4 mg (not administered)  metoprolol succinate (TOPROL-XL) 24 hr tablet 100 mg (100 mg Oral Given 06/07/17 1521)  ramipril (ALTACE) capsule 10 mg (10 mg Oral Given 06/07/17 1521)  spironolactone (ALDACTONE) tablet 25 mg (not administered)  furosemide (LASIX) tablet 40 mg (40 mg Oral Given 06/07/17 1434)  acetaminophen (TYLENOL) tablet 1,000 mg (not administered)  pantoprazole (PROTONIX) EC tablet 40 mg (not administered)  nitroGLYCERIN (NITROSTAT) SL tablet 0.4 mg (not administered)  acetaminophen (TYLENOL) tablet 650 mg (not administered)  ondansetron (ZOFRAN) injection 4 mg (not  administered)  heparin ADULT infusion 100 units/mL (25000 units/250mL sodium chloride 0.45%) (not administered)  aspirin chewable tablet 324 mg (324 mg Oral Given 06/07/17 1434)    Or  aspirin suppository 300 mg ( Rectal See Alternative 06/07/17 1434)     Initial Impression / Assessment and Plan / ED Course  I have reviewed the triage vital signs and the nursing notes.  Pertinent labs & imaging results that were available during my care of the patient were reviewed by me and  considered in my medical decision making (see chart for details).     On arrival to triage, the patient was mildly tachycardic with EKG similar to previous.  When he was brought back to room, he converted to a wide-complex tachycardia.  I immediately contacted cardiology and discussed with Dr. Curt Bears. The patient very quickly converted out of V tach prior to intervention and without any change in mental status. Cardiology evaluated him and they will initiate amiodarone in ED. Pt admitted to cardiology service for further care.  Final Clinical Impressions(s) / ED Diagnoses   Final diagnoses:  Ventricular tachycardia (paroxysmal) [facility])    ED Discharge Orders    None       Joey Lierman, Wenda Overland, MD 06/07/17 1721

## 2017-06-07 NOTE — ED Triage Notes (Signed)
Patient reports that he had cardioversion yesterday for A-fib. States that during the night developed heart racing. States that he thinks he is back in atrial fib. Alert and oriented, no SOB

## 2017-06-07 NOTE — Telephone Encounter (Signed)
Attempted to call patient, but there was no answer. Left message for patient to call back. Informed by [facility] that patient is in VT.

## 2017-06-08 ENCOUNTER — Encounter: Payer: Self-pay | Admitting: Cardiology

## 2017-06-08 ENCOUNTER — Inpatient Hospital Stay (HOSPITAL_COMMUNITY): Payer: Medicare Other

## 2017-06-08 DIAGNOSIS — I481 Persistent atrial fibrillation: Secondary | ICD-10-CM

## 2017-06-08 DIAGNOSIS — I255 Ischemic cardiomyopathy: Secondary | ICD-10-CM

## 2017-06-08 DIAGNOSIS — I480 Paroxysmal atrial fibrillation: Secondary | ICD-10-CM

## 2017-06-08 DIAGNOSIS — I5023 Acute on chronic systolic (congestive) heart failure: Secondary | ICD-10-CM

## 2017-06-08 DIAGNOSIS — I5022 Chronic systolic (congestive) heart failure: Secondary | ICD-10-CM

## 2017-06-08 LAB — APTT: aPTT: 34 seconds (ref 24–36)

## 2017-06-08 LAB — CBC
HEMATOCRIT: 42.1 % (ref 39.0–52.0)
HEMOGLOBIN: 13.3 g/dL (ref 13.0–17.0)
MCH: 28.1 pg (ref 26.0–34.0)
MCHC: 31.6 g/dL (ref 30.0–36.0)
MCV: 88.8 fL (ref 78.0–100.0)
Platelets: 220 10*3/uL (ref 150–400)
RBC: 4.74 MIL/uL (ref 4.22–5.81)
RDW: 14.9 % (ref 11.5–15.5)
WBC: 9.1 10*3/uL (ref 4.0–10.5)

## 2017-06-08 LAB — BASIC METABOLIC PANEL
ANION GAP: 8 (ref 5–15)
BUN: 14 mg/dL (ref 6–20)
CALCIUM: 8.8 mg/dL — AB (ref 8.9–10.3)
CHLORIDE: 103 mmol/L (ref 101–111)
CO2: 23 mmol/L (ref 22–32)
Creatinine, Ser: 0.96 mg/dL (ref 0.61–1.24)
GFR calc non Af Amer: >60 mL/min (ref >60)
Glucose, Bld: 140 mg/dL — ABNORMAL HIGH (ref 65–99)
POTASSIUM: 3.9 mmol/L (ref 3.5–5.1)
Sodium: 134 mmol/L — ABNORMAL LOW (ref 135–145)

## 2017-06-08 LAB — HIV ANTIBODY (ROUTINE TESTING W REFLEX): HIV Screen 4th Generation wRfx: NONREACTIVE

## 2017-06-08 LAB — GLUCOSE, CAPILLARY
Glucose-Capillary: 143 mg/dL — ABNORMAL HIGH (ref 65–99)
Glucose-Capillary: 194 mg/dL — ABNORMAL HIGH (ref 65–99)

## 2017-06-08 LAB — HEPARIN LEVEL (UNFRACTIONATED): HEPARIN UNFRACTIONATED: 0.66 [IU]/mL (ref 0.30–0.70)

## 2017-06-08 MED ORDER — AMIODARONE HCL 200 MG PO TABS
400.0000 mg | ORAL_TABLET | Freq: Two times a day (BID) | ORAL | Status: DC
  Administered 2017-06-08: 400 mg via ORAL
  Filled 2017-06-08: qty 2

## 2017-06-08 MED ORDER — AMIODARONE IV BOLUS ONLY 150 MG/100ML: 150.0000 mg | Freq: Once | INTRAVENOUS | Status: DC

## 2017-06-08 MED ORDER — HEPARIN (PORCINE) IN NACL 100-0.45 UNIT/ML-% IJ SOLN
1600.0000 [IU]/h | INTRAMUSCULAR | Status: DC
  Administered 2017-06-08: 1400 [IU]/h via INTRAVENOUS
  Administered 2017-06-09: 1600 [IU]/h via INTRAVENOUS
  Filled 2017-06-08: qty 250

## 2017-06-08 MED ORDER — AMIODARONE HCL IN DEXTROSE 360-4.14 MG/200ML-% IV SOLN
30.0000 mg/h | INTRAVENOUS | Status: DC
  Administered 2017-06-09 – 2017-06-10 (×3): 30 mg/h via INTRAVENOUS
  Filled 2017-06-08 (×3): qty 200

## 2017-06-08 MED ORDER — AMIODARONE HCL 200 MG PO TABS: 400.0000 mg | ORAL_TABLET | Freq: Two times a day (BID) | ORAL | Status: DC

## 2017-06-08 MED ORDER — AMIODARONE LOAD VIA INFUSION
150.0000 mg | Freq: Once | INTRAVENOUS | Status: AC
  Administered 2017-06-08: 150 mg via INTRAVENOUS

## 2017-06-08 MED ORDER — RAMIPRIL 2.5 MG PO CAPS
10.0000 mg | ORAL_CAPSULE | Freq: Every day | ORAL | Status: DC
  Administered 2017-06-09 – 2017-06-11 (×3): 10 mg via ORAL
  Filled 2017-06-08: qty 4
  Filled 2017-06-08 (×2): qty 1

## 2017-06-08 MED ORDER — ZOLPIDEM TARTRATE 5 MG PO TABS
10.0000 mg | ORAL_TABLET | Freq: Every evening | ORAL | Status: DC | PRN
  Administered 2017-06-08 – 2017-06-11 (×3): 10 mg via ORAL
  Filled 2017-06-08 (×3): qty 2

## 2017-06-08 MED ORDER — APIXABAN 5 MG PO TABS
5.0000 mg | ORAL_TABLET | Freq: Two times a day (BID) | ORAL | Status: DC
  Administered 2017-06-08: 5 mg via ORAL
  Filled 2017-06-08: qty 1

## 2017-06-08 MED ORDER — AMIODARONE HCL IN DEXTROSE 360-4.14 MG/200ML-% IV SOLN
INTRAVENOUS | Status: AC
  Administered 2017-06-08: 150 mg via INTRAVENOUS
  Filled 2017-06-08: qty 200

## 2017-06-08 MED ORDER — AMIODARONE LOAD VIA INFUSION
150.0000 mg | Freq: Once | INTRAVENOUS | Status: AC
  Administered 2017-06-08: 150 mg via INTRAVENOUS
  Filled 2017-06-08: qty 83.34

## 2017-06-08 MED ORDER — AMIODARONE HCL IN DEXTROSE 360-4.14 MG/200ML-% IV SOLN
60.0000 mg/h | INTRAVENOUS | Status: AC
  Administered 2017-06-08: 60 mg/h via INTRAVENOUS

## 2017-06-08 MED ORDER — PNEUMOCOCCAL VAC POLYVALENT 25 MCG/0.5ML IJ INJ
0.5000 mL | INJECTION | INTRAMUSCULAR | Status: DC | PRN
  Filled 2017-06-08: qty 0.5

## 2017-06-08 NOTE — Progress Notes (Signed)
Patient converted back in to Afib with RVR (HR 140-150s) for a second time. Patient asymptomatic. Per Dr. Sallyanne Kuster, telemetry orders cancelled, amiodarone bolus given, drip started. Will continue to monitor closely.  [name]. [name] RN

## 2017-06-08 NOTE — Care Management Note (Signed)
Case Management Note Marvetta Gibbons RN, BSN Unit 4E-Case Manager-- [place] coverage [phone]  Patient Details  Name: [name] MRN: [id number] Date of Birth: [birth year]  Subjective/Objective:  Pt admitted with VT - had DCCV on 11/28 for persistent AF and worsening HF symptoms.          Action/Plan: PTA pt lived at home- anticipate return home- CM to follow.   Expected Discharge Date:                  Expected Discharge Plan:  Home/Self Care  In-House Referral:     Discharge planning Services  CM Consult  Post Acute Care Choice:    Choice offered to:     DME Arranged:    DME Agency:     HH Arranged:    HH Agency:     Status of Service:  In process, will continue to follow  If discussed at Long Length of Stay Meetings, dates discussed:    Discharge Disposition:   Additional Comments:  Dawayne Patricia, RN 06/08/2017, 11:26 AM

## 2017-06-08 NOTE — Progress Notes (Addendum)
ANTICOAGULATION CONSULT NOTE - Follow-Up Consult  Pharmacy Consult for Heparin Indication: chest pain/ACS and atrial fibrillation  Allergies  Allergen Reactions  . Coreg [Carvedilol] Diarrhea  . Entresto [Sacubitril-Valsartan] Other (See Comments)    Joint pain  . Jardiance [Empagliflozin] Other (See Comments)    Possible afib, definitely cost  . Lipitor [Atorvastatin] Diarrhea    Patient Measurements: Height: 6\' 4"  (193 cm) Weight: 225 lb 5 oz (102.2 kg) IBW/kg (Calculated) : 86.8 Heparin Dosing Weight: 102.2 kg   Vital Signs: Temp: 99.4 F (37.4 C) (11/30 1600) Temp Source: Oral (11/30 1600) BP: 115/78 (11/30 1800) Pulse Rate: 85 (11/30 1800)  Labs: Recent Labs    06/06/17 1251 06/07/17 1052 06/08/17 0625  HGB 15.0 14.0 13.3  HCT 44.0 43.7 42.1  PLT  --  224 220  APTT  --   --  34  HEPARINUNFRC  --   --  0.66  CREATININE  --  1.00 0.96    Estimated Creatinine Clearance: 99.2 mL/min (by C-G formula based on SCr of 0.96 mg/dL).   Medical History: Past Medical History:  Diagnosis Date  . CAD (coronary artery disease) last 2005   multiple MIs  . CHF (congestive heart failure) (HCC)    secondary to severe ischemic cardiomyopathy.   a.    EF 20% with akinesis of the distal half to two-thirds of the ventricle.  Trivial MR   b.     Status post bi-V ICD.   c.     Cardiopulmonary exercise test (12/11) pVO2 of 24.5,VE/VCO2 slope is 31,.7  . Colon polyps   . Diabetes mellitus   . Diverticulosis   . GERD (gastroesophageal reflux disease)   . Headache(784.0)   . Hemorrhoids   . Hyperlipidemia   . 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 Insomnia   . Left bundle branch block   . Persistent atrial fibrillation ([place])   . Prostate neoplasm   . Tinnitus    comes and goes    Medications:  Medications Prior to Admission  Medication Sig Dispense Refill Last Dose  . apixaban (ELIQUIS) 5 MG TABS tablet Take 1 tablet (5 mg  total) 2 (two) times daily by mouth. 60 tablet 5 06/06/2017 at 2000  . DiphenhydrAMINE HCl (ZZZQUIL) 50 MG/30ML LIQD Take 35 mg by mouth at bedtime as needed (sleep).   Past Month at Unknown time  . esomeprazole (NEXIUM) 20 MG capsule Take 20 mg by mouth daily.    06/06/2017 at Unknown time  . furosemide (LASIX) 40 MG tablet Take 1 tablet (40 mg total) by mouth every morning. 90 tablet 3 06/06/2017 at Unknown time  . glimepiride (AMARYL) 4 MG tablet Take 4 mg by mouth every evening.    06/06/2017 at Unknown time  . metoprolol succinate (TOPROL-XL) 100 MG 24 hr tablet Take 1 tablet (100 mg total) by mouth daily. Take with or immediately following a meal. 90 tablet 3 06/06/2017 at 0830  . ramipril (ALTACE) 10 MG capsule Take 1 capsule (10 mg total) by mouth daily. 90 capsule 3 06/06/2017 at Unknown time  . spironolactone (ALDACTONE) 25 MG tablet TAKE ONE TABLET BY MOUTH ONCE DAILY IN THE MORNING 90 tablet 3 06/06/2017 at Unknown time  . Tetrahydrozoline HCl (VISINE OP) Apply 3 drops to eye daily as needed (dry eyes).   Past Month at Unknown time  . zolpidem (AMBIEN CR) 12.5 MG CR tablet Take 12.5 mg by mouth at bedtime.  06/05/2017 at Unknown time  . acetaminophen (TYLENOL) 500 MG tablet Take 1,000 mg by mouth daily as needed for moderate pain.   prn  . loperamide (IMODIUM A-D) 2 MG tablet Take 4 mg by mouth daily as needed for diarrhea or loose stools.   prn  . nitroGLYCERIN (NITROSTAT) 0.4 MG SL tablet Place 1 tablet (0.4 mg total) under the tongue every 5 (five) minutes as needed for chest pain. 25 tablet 1 prn    Assessment: 53 YOM with h/o of Afib s/p DCCV on 06/06/2017 seen today for evaluation of VT. Patient is on apixaban at home and received a dose in the ED on 11/29 before being change to heparin infusion. First levels obtained were subtherapeutic (aPTT 34 and heparin level 0.66 [falsely elevated due to apixaban dosing]) on 1200 units/hr. Patient was transitioned to apixaban today on 11/30  at 1001. Pharmacy consulted to restart IV heparin due to patient converting back to atrial fibrillation, with plans for possible cardiac cath on Monday.  CBC is stable. No signs/symptoms of bleeding noted. Will restart heparin 12 hours from last dose. Will monitor aPTT and heparin levels until correlate given recent dosing.   Goal of Therapy:  Heparin level 0.3-0.7 units/ml  APTT 66-102s  Monitor platelets by anticoagulation protocol: Yes   Plan:  -Discontinue apixaban -Start IV heparin at 1400 units/hr at 2200 on 11/30 without a bolus. -F/u 6 hr HL/aPTT level -Monitor daily aPTT, HL, CBC and s/s of bleeding    Doylene Canard, PharmD Clinical Pharmacist  Pager: [phone] Phone: [phone]

## 2017-06-08 NOTE — Progress Notes (Signed)
Progress Note  Patient Name: [name] Date of Encounter: 06/08/2017  Primary Cardiologist: SK  Primary Electrophysiologist: SK   Patient Profile     61 y.o. male admitted 11/29 with sustained VT @ 164  Hx  congestive heart failure; ischemic cardiomyopathy with an EF of 20%. He is status post CRT-D. Also has persistent afib and  DCCV 11/28 Echo 5/14  EF 20% Started on amio, apixoban>heparin with plans for cath  Subjective   Feeling better this morning.  No interval VT.  Denies chest pain.  Functional status is quite limited.  Inpatient Medications    Scheduled Meds: . furosemide  40 mg Oral q morning - 10a  . metoprolol succinate  100 mg Oral Daily  . pantoprazole  40 mg Oral Daily  . pneumococcal 23 valent vaccine  0.5 mL Intramuscular Tomorrow-1000  . ramipril  10 mg Oral Daily  . spironolactone  25 mg Oral Daily   Continuous Infusions: . amiodarone 30 mg/hr (06/08/17 0600)  . heparin 1,200 Units/hr (06/08/17 0600)   PRN Meds: acetaminophen, Influenza vac split quadrivalent PF, nitroGLYCERIN, ondansetron (ZOFRAN) IV   Vital Signs    Vitals:   06/08/17 0300 06/08/17 0400 06/08/17 0500 06/08/17 0600  BP: 109/76 107/74 113/80 116/74  Pulse: 67 87 82 79  Resp: 20 16 (!) 27 14  Temp: 97.9 F (36.6 C)     TempSrc: Oral     SpO2: 94% 95% 95% 95%  Weight:      Height:        Intake/Output Summary (Last 24 hours) at 06/08/2017 0710 Last data filed at 06/08/2017 0600 Gross per 24 hour  Intake 299.32 ml  Output 750 ml  Net -450.68 ml   Filed Weights   06/07/17 1054 06/07/17 1700  Weight: 230 lb (104.3 kg) 225 lb 5 oz (102.2 kg)    Telemetry    P synchronous pacing- Personally Reviewed  ECG    P synchronous pacing- Personally Reviewed  Physical Exam    GEN: No acute distress.   Neck: JVD 8 cm   Cardiac: RRR, no  murmurs, rubs, or gallops.  Respiratory: Clear to auscultation bilaterally. GI: Soft, nontender, non-distended  MS:  edema; No  deformity. Neuro:  Nonfocal  Psych: Normal affect  Skin Warm and dry   Labs    Chemistry Recent Labs  Lab 06/06/17 1251 06/07/17 1052  NA 140 136  K 4.6 4.1  CL  --  106  CO2  --  21*  GLUCOSE 152* 160*  BUN  --  16  CREATININE  --  1.00  CALCIUM  --  9.1  GFRNONAA  --  >60  GFRAA  --  >60  ANIONGAP  --  9     Hematology Recent Labs  Lab 06/06/17 1251 06/07/17 1052  WBC  --  7.7  RBC  --  4.92  HGB 15.0 14.0  HCT 44.0 43.7  MCV  --  88.8  MCH  --  28.5  MCHC  --  32.0  RDW  --  14.7  PLT  --  224    Cardiac EnzymesNo results for input(s): TROPONINI in the last 168 hours. No results for input(s): TROPIPOC in the last 168 hours.   BNPNo results for input(s): BNP, PROBNP in the last 168 hours.   DDimer No results for input(s): DDIMER in the last 168 hours.   Radiology    Dg Chest 2 View  Result Date: 06/07/2017 CLINICAL DATA:  The  patient developed a sensation of tachycardia during the night. He underwent cardioversion for atrial fibrillation yesterday. EXAM: CHEST  2 VIEW COMPARISON:  PA and lateral chest 01/25/2010.  CT chest 01/26/2017. FINDINGS: AICD is in place, unchanged. Heart size is normal. Calcification in the left ventricular wall is better visualized on prior CT. Lungs are clear. No pneumothorax or pleural effusion. Aortic atherosclerosis is noted. No acute bony abnormality. IMPRESSION: No acute disease. Atherosclerosis. Electronically Signed   By: Inge Rise M.D.   On: 06/07/2017 11:30    Cardiac Studies   Echo P  Device Interrogation    As above    Assessment & Plan    Ventricular tachycardia  Atrial fibrillation  Ischemic cardiomyopathy  ICD-CRT [employer]   Anxiety  CHF-chronic-systolic   The patient's was recently cardioverted.  We will resume apixaban and anticipate catheterization as an outpatient right and left heart in about 3 weeks.  We will try and coordinate this with the heart failure service.  In the  interim, we will continue amiodarone.  We will transition IV--p.o. and anticipate discharge in the next 24-48 hours.  We will need to reprogram his ICD.  VT in the absence of amiodarone was about 165.  We will need to reprogram his VT detection--140 and would use the metered RIT 60 sec. Protocol Would not programmed shocks on in this zone  As relates to recurrent atrial fibrillation, either AV junction ablation or PVI would be appropriate.  There is a great deal of anxiety related to inappropriate shocks A. fib.  This may drive [place] to make a decision earlier rather than later.  I would have a low threshold for AV junction ablation   He is euvolemic.  We will continue his current diuretics  Cost is been an issue so Delene Loll was not an option  Signed, [name], MD  06/08/2017, 7:10 AM

## 2017-06-08 NOTE — Consult Note (Addendum)
Advanced Heart Failure Team Consult Note   Primary Physician: Primary Cardiologist:  Dr Haroldine Laws   Reason for Consultation: Heart Failure   HPI:    [name] is seen today for evaluation of heart failure at the request of Dr Caryl Comes.   [name] is a 61 year old with h/o ICM, chronic systolic heart failure, biv, DM, HTN, CAD with previous stents,  SVT, and chronic Afib.   Earlier this month he was back in A fib. Started on eliquis and set up for DC/CV. Has had episodes of dyspnea with exeriton and fatigue. On 11/28 he underwent successful outpatient cardioversion. He was feeling ok but developed tachycardia and said he felt like he was going to die. He called EP and transmission showed VT. He presented to [facility] in [place] and underwent cardioversion. Loading on amiodarone. Pertinent admission labs included: K 4.1 creatinine 1.0, and hgb 14.   Today he denies SOB/CP .  ECHO 2014 EF 20%  Left ventricle: Base moves better than other walls. The cavity size was severely dilated. Wall thickness was increased in a pattern of mild LVH. The estimated ejection fraction was 20%. Findings consistent with left ventricular diastolic dysfunction. - Left atrium: The atrium was mildly dilated. - Right ventricle: The cavity size was normal. Pacer wire or catheter noted in right ventricle. Systolic function was normal. - Right atrium: The atrium was mildly dilated.  Transthoracic echocardiography.  Myoview 7/11 which showed EF 17% with markedly dilated LV  EDV: 509 ml ESV: 422 ml. Extensive scar with trivial peri-infarct ischemia.  CPX  12/11 pVO2 24.5 slope 31.7 RER 1.12 O2 pulse 105%    Review of Systems: [y] = yes, [ ]  = no   General: Weight gain [ ] ; Weight loss [ ] ; Anorexia [ ] ; Fatigue [Y ]; Fever [ ] ; Chills [ ] ; Weakness [ ]   Cardiac: Chest pain/pressure [ ] ; Resting SOB [ ] ; Exertional SOB [Y ]; Orthopnea [ ] ; Pedal Edema [ ] ; Palpitations [ ] ; Syncope [ ] ; Presyncope [ ] ;  Paroxysmal nocturnal dyspnea[ ]   Pulmonary: Cough [ ] ; Wheezing[ ] ; Hemoptysis[ ] ; Sputum [ ] ; Snoring [ ]   GI: Vomiting[ ] ; Dysphagia[ ] ; Melena[ ] ; Hematochezia [ ] ; Heartburn[ ] ; Abdominal pain [ ] ; Constipation [ ] ; Diarrhea [ ] ; BRBPR [ ]   GU: Hematuria[ ] ; Dysuria [ ] ; Nocturia[ ]   Vascular: Pain in legs with walking [ ] ; Pain in feet with lying flat [ ] ; Non-healing sores [ ] ; Stroke [ ] ; TIA [ ] ; Slurred speech [ ] ;  Neuro: Headaches[ ] ; Vertigo[ ] ; Seizures[ ] ; Paresthesias[ ] ;Blurred vision [ ] ; Diplopia [ ] ; Vision changes [ ]   Ortho/Skin: Arthritis Blue.Reese ]; Joint pain [ y]; Muscle pain [ ] ; Joint swelling [ ] ; Back Pain [ ] ; Rash [ ]   Psych: Depression[ ] ; Anxiety[ ]   Heme: Bleeding problems [ ] ; Clotting disorders [ ] ; Anemia [ ]   Endocrine: Diabetes [ Y]; Thyroid dysfunction[ ]   Home Medications Prior to Admission medications   Medication Sig Start Date End Date Taking? Authorizing Provider  apixaban (ELIQUIS) 5 MG TABS tablet Take 1 tablet (5 mg total) 2 (two) times daily by mouth. 05/16/17  Yes Seiler, Amber K, NP  DiphenhydrAMINE HCl (ZZZQUIL) 50 MG/30ML LIQD Take 35 mg by mouth at bedtime as needed (sleep).   Yes [provider]  esomeprazole (NEXIUM) 20 MG capsule Take 20 mg by mouth daily.    Yes [provider]  furosemide (LASIX) 40 MG tablet  Take 1 tablet (40 mg total) by mouth every morning. 07/05/16  Yes Larey Dresser, MD  glimepiride (AMARYL) 4 MG tablet Take 4 mg by mouth every evening.  05/09/17  Yes [provider]  metoprolol succinate (TOPROL-XL) 100 MG 24 hr tablet Take 1 tablet (100 mg total) by mouth daily. Take with or immediately following a meal. 06/06/16 06/07/17 Yes Deboraha Sprang, MD  ramipril (ALTACE) 10 MG capsule Take 1 capsule (10 mg total) by mouth daily. 06/12/16  Yes Deboraha Sprang, MD  spironolactone (ALDACTONE) 25 MG tablet TAKE ONE TABLET BY MOUTH ONCE DAILY IN THE MORNING 07/05/16  Yes Larey Dresser, MD    Tetrahydrozoline HCl (VISINE OP) Apply 3 drops to eye daily as needed (dry eyes).   Yes [provider]  zolpidem (AMBIEN CR) 12.5 MG CR tablet Take 12.5 mg by mouth at bedtime.    Yes [provider]  acetaminophen (TYLENOL) 500 MG tablet Take 1,000 mg by mouth daily as needed for moderate pain.    [provider]  loperamide (IMODIUM A-D) 2 MG tablet Take 4 mg by mouth daily as needed for diarrhea or loose stools.    [provider]  nitroGLYCERIN (NITROSTAT) 0.4 MG SL tablet Place 1 tablet (0.4 mg total) under the tongue every 5 (five) minutes as needed for chest pain. 04/13/14   Deboraha Sprang, MD    Past Medical History: Past Medical History:  Diagnosis Date  . CAD (coronary artery disease) last 2005   multiple MIs  . CHF (congestive heart failure) (HCC)    secondary to severe ischemic cardiomyopathy.   a.    EF 20% with akinesis of the distal half to two-thirds of the ventricle.  Trivial Scott   b.     Status post bi-V ICD.   c.     Cardiopulmonary exercise test (12/11) pVO2 of 24.5,VE/VCO2 slope is 31,.7  . Colon polyps   . Diabetes mellitus   . Diverticulosis   . GERD (gastroesophageal reflux disease)   . Headache(784.0)   . Hemorrhoids   . Hyperlipidemia   . 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 Insomnia   . Left bundle branch block   . Persistent atrial fibrillation ([place])   . Prostate neoplasm   . Tinnitus    comes and goes    Past Surgical History: Past Surgical History:  Procedure Laterality Date  . CARDIAC CATHETERIZATION  2005   stent x 1 placed  . CARDIOVERSION N/A 06/06/2017   Procedure: CARDIOVERSION;  Surgeon: Larey Dresser, MD;  Location[facility] ENDOSCOPY;  Service: Cardiovascular;  Laterality: N/A;  . colonscopy     x 2  . dual chamber defibrillator  06/15/2004  . HEMORRHOID BANDING  09-2012  . single-chamber ICD implantation  08/10/2003  . TONSILLECTOMY  age 48    Family  History: Family History  Problem Relation Age of Onset  . Coronary artery disease Unknown     Social History: Social History   Socioeconomic History  . Marital status: Single    Spouse name: None  . Number of children: None  . Years of education: None  . Highest education level: None  Social Needs  . Financial resource strain: None  . Food insecurity - worry: None  . Food insecurity - inability: None  . Transportation needs - medical: None  . Transportation needs - non-medical: None  Occupational History  . None  Tobacco Use  . Smoking status: Former Smoker    Packs/day: 2.00    Years: 30.00    Pack years: 60.00    Types: Cigarettes    Last attempt to quit: 07/20/2003    Years since quitting: 13.8  . Smokeless tobacco: Never Used  Substance and Sexual Activity  . Alcohol use: Yes    Comment: Occasional.  . 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 Sexual activity: None  Other Topics Concern  . None  Social History Narrative  . None    Allergies:  Allergies  Allergen Reactions  . Coreg [Carvedilol] Diarrhea  . Entresto [Sacubitril-Valsartan] Other (See Comments)    Joint pain  . Jardiance [Empagliflozin] Other (See Comments)    Possible afib, definitely cost  . Lipitor [Atorvastatin] Diarrhea    Objective:    Vital Signs:   Temp:  [97.7 F (36.5 C)-98.5 F (36.9 C)] 98.1 F (36.7 C) (11/30 0818) Pulse Rate:  [65-164] 87 (11/30 0800) Resp:  [10-27] 15 (11/30 0800) BP: (98-129)/(63-99) 129/81 (11/30 0800) SpO2:  [91 %-98 %] 96 % (11/30 0800) Weight:  [225 lb 5 oz (102.2 kg)-230 lb (104.3 kg)] 225 lb 5 oz (102.2 kg) (11/29 1700) Last BM Date: 06/07/17  Weight change: Filed Weights   06/07/17 1054 06/07/17 1700  Weight: 230 lb (104.3 kg) 225 lb 5 oz (102.2 kg)    Intake/Output:   Intake/Output Summary (Last 24 hours) at 06/08/2017 0837 Last data filed at 06/08/2017 0800 Gross per 24 hour  Intake 356.72 ml    Output 850 ml  Net -493.28 ml      Physical Exam    General:  Well appearing. No resp difficulty. In bed  HEENT: normal Neck: supple. JVP 5-6. Carotids 2+ bilat; no bruits. No lymphadenopathy or thyromegaly appreciated. Cor: PMI nondisplaced. Regular rate & rhythm. No rubs, gallops or murmurs.Zoll Pads in place  Lungs: clear on room air Abdomen: soft, nontender, nondistended. No hepatosplenomegaly. No bruits or masses. Good bowel sounds. Extremities: no cyanosis, clubbing, rash, edema Neuro: alert & orientedx3, cranial nerves grossly intact. moves all 4 extremities w/o difficulty. Affect pleasant   Telemetry   V paced 91 bpm  EKG    [place] on admit in the ED   Labs   Basic Metabolic Panel: Recent Labs  Lab 06/06/17 1251 06/07/17 1052 06/08/17 0625  NA 140 136 134*  K 4.6 4.1 3.9  CL  --  106 103  CO2  --  21* 23  GLUCOSE 152* 160* 140*  BUN  --  16 14  CREATININE  --  1.00 0.96  CALCIUM  --  9.1 8.8*    Liver Function Tests: No results for input(s): AST, ALT, ALKPHOS, BILITOT, PROT, ALBUMIN in the last 168 hours. No results for input(s): LIPASE, AMYLASE in the last 168 hours. No results for input(s): AMMONIA in the last 168 hours.  CBC: Recent Labs  Lab 06/06/17 1251 06/07/17 1052 06/08/17 0625  WBC  --  7.7 9.1  HGB 15.0 14.0 13.3  HCT 44.0 43.7 42.1  MCV  --  88.8 88.8  PLT  --  224 220    Cardiac Enzymes: No results for input(s): CKTOTAL, CKMB, CKMBINDEX, TROPONINI in the last 168 hours.  BNP: BNP (last 3 results) No results for input(s): BNP in the last 8760 hours.  ProBNP (last 3 results) No results for input(s): PROBNP in the last 8760 hours.  CBG: Recent Labs  Lab 06/08/17 0820  GLUCAP 143*    Coagulation Studies: No results for input(s): LABPROT, INR in the last 72 hours.   Imaging   Dg Chest 2 View  Result Date: 06/07/2017 CLINICAL DATA:  The patient developed a sensation of tachycardia during the night. He underwent  cardioversion for atrial fibrillation yesterday. EXAM: CHEST  2 VIEW COMPARISON:  PA and lateral chest 01/25/2010.  CT chest 01/26/2017. FINDINGS: AICD is in place, unchanged. Heart size is normal. Calcification in the left ventricular wall is better visualized on prior CT. Lungs are clear. No pneumothorax or pleural effusion. Aortic atherosclerosis is noted. No acute bony abnormality. IMPRESSION: No acute disease. Atherosclerosis. Electronically Signed   By: Inge Rise M.D.   On: 06/07/2017 11:30      Medications:     Current Medications: . furosemide  40 mg Oral q morning - 10a  . metoprolol succinate  100 mg Oral Daily  . pantoprazole  40 mg Oral Daily  . pneumococcal 23 valent vaccine  0.5 mL Intramuscular Tomorrow-1000  . ramipril  10 mg Oral Daily  . spironolactone  25 mg Oral Daily     Infusions: . amiodarone 30 mg/hr (06/08/17 0800)  . heparin 1,200 Units/hr (06/08/17 0800)       Patient Profile  [name] is a 61 year old with h/o ICM, chronic systolic heart failure, biv, DM, HTN, CAD with previous stents,  SVT, and Afib.   Presented to [facility] ED with VT.  Assessment/Plan   1. VT on admit  Loading on amiodarone.  Has ICD No driving 6 months  2. PAF S/P DC-CV 11/28.  Stop heparin. Restart eliquis 5 mg twice a day.  3. Chronic Systolic Heart Failure due to ischemic cardiomyopathy -Volume status stable  -Continue toprol xl 100 mg daily., -Continue ramipril 10 mg daily. He is intolerant entresto due to joint pain.    Continue spironolactone 25 mg daily and lasix 40 mg daily.  -Repeat ECHO has not had ECHO since 2014 because he refused. (TEE on 11/28 EF 20%) 4. CAD On bb.  Intolerant lipitor.   5. DMII  Will need cath 2-3 weeks. We will set up follow up in HF clinic 7-10 days.  Medication concerns reviewed with patient and pharmacy team. Barriers identified:none    Length of Stay: 1  Amy Clegg, NP  06/08/2017, 8:37 AM  Advanced Heart Failure  Team Pager [phone] (M-F; 7a - 4p)  Please contact [facility] for night-coverage after hours (4p -7a ) and weekends on [email or web address]  Patient seen and examined with Darrick Grinder, NP. We discussed all aspects of the encounter. I agree with the assessment and plan as stated above.   61 y/o with severe ischemic CM (EF 20%) and chronic AF. Underwent outpatient DC-CV of AF on 11/28. Presented to ER on 11/29 with VT @ 164 which was hemodynamically stable. Started on IV amiodarone and converted. This am was in NSR and doing well. At baseline stable NYHA II. No CP. Ischemic eval has been suggested due to VT but deferred with recent DC-CV. Heparin was switched back to Eliquis this afternoon. (I discussed with PharmD)  This afternoon he reverted to AF with RVR and IV amio rebolused.   Suspect AF will likely be contact but now that he is on amio for VT but worth another shot at restoring NSR after full amio load. Will switch Eliquis back to heparin. Can consider cath on Monday (prior to DC-CV) if  still in AF.  Glori Bickers, MD  6:28 PM

## 2017-06-08 NOTE — Progress Notes (Signed)
Patient went into afib w/ RVR- MD notified; 150mg  bolus of amiodarone ordered (x2).  Patient converted to NSR after 1st amio bolus. 2nd bolus not given. Will continue to monitor.

## 2017-06-09 ENCOUNTER — Inpatient Hospital Stay (HOSPITAL_COMMUNITY): Payer: Medicare Other

## 2017-06-09 DIAGNOSIS — I361 Nonrheumatic tricuspid (valve) insufficiency: Secondary | ICD-10-CM

## 2017-06-09 LAB — HEPATIC FUNCTION PANEL
ALBUMIN: 3.1 g/dL — AB (ref 3.5–5.0)
ALK PHOS: 45 U/L (ref 38–126)
ALT: 25 U/L (ref 17–63)
AST: 18 U/L (ref 15–41)
BILIRUBIN TOTAL: 2 mg/dL — AB (ref 0.3–1.2)
Bilirubin, Direct: 0.4 mg/dL (ref 0.1–0.5)
Indirect Bilirubin: 1.6 mg/dL — ABNORMAL HIGH (ref 0.3–0.9)
TOTAL PROTEIN: 6.4 g/dL — AB (ref 6.5–8.1)

## 2017-06-09 LAB — ECHOCARDIOGRAM COMPLETE
AVLVOTPG: 2 mmHg
FS: 17 % — AB (ref 28–44)
Height: 76 in
IVS/LV PW RATIO, ED: 0.97
LA diam index: 2.12 cm/m2
LASIZE: 50 mm
LAVOLA4C: 97.3 mL
LDCA: 3.14 cm2
LEFT ATRIUM END SYS DIAM: 50 mm
LVOT SV: 35 mL
LVOT VTI: 11.2 cm
LVOTD: 20 mm
LVOTPV: 62.7 cm/s
Lateral S' vel: 4.23 cm/s
PW: 14.5 mm — AB (ref 0.6–1.1)
RV TAPSE: 16.3 mm
RV sys press: 59 mmHg
Reg peak vel: 356 cm/s
TR max vel: 356 cm/s
Weight: 3604.96 oz

## 2017-06-09 LAB — CBC
HCT: 39.7 % (ref 39.0–52.0)
Hemoglobin: 12.7 g/dL — ABNORMAL LOW (ref 13.0–17.0)
MCH: 28.2 pg (ref 26.0–34.0)
MCHC: 32 g/dL (ref 30.0–36.0)
MCV: 88.2 fL (ref 78.0–100.0)
PLATELETS: 196 10*3/uL (ref 150–400)
RBC: 4.5 MIL/uL (ref 4.22–5.81)
RDW: 14.8 % (ref 11.5–15.5)
WBC: 8.5 10*3/uL (ref 4.0–10.5)

## 2017-06-09 LAB — APTT: aPTT: 46 seconds — ABNORMAL HIGH (ref 24–36)

## 2017-06-09 LAB — HEPARIN LEVEL (UNFRACTIONATED): HEPARIN UNFRACTIONATED: 0.81 [IU]/mL — AB (ref 0.30–0.70)

## 2017-06-09 LAB — TSH: TSH: 2.404 u[IU]/mL (ref 0.350–4.500)

## 2017-06-09 MED ORDER — APIXABAN 5 MG PO TABS
5.0000 mg | ORAL_TABLET | Freq: Two times a day (BID) | ORAL | Status: DC
  Administered 2017-06-09 – 2017-06-11 (×5): 5 mg via ORAL
  Filled 2017-06-09 (×5): qty 1

## 2017-06-09 MED ORDER — PERFLUTREN LIPID MICROSPHERE
1.0000 mL | INTRAVENOUS | Status: AC | PRN
  Filled 2017-06-09: qty 10

## 2017-06-09 MED ORDER — PERFLUTREN LIPID MICROSPHERE
INTRAVENOUS | Status: AC
  Administered 2017-06-09: 4 mL
  Filled 2017-06-09: qty 10

## 2017-06-09 NOTE — Progress Notes (Signed)
Progress Note  Patient Name: [name] Date of Encounter: 06/09/2017  Primary Cardiologist: Caryl Comes  Subjective   No chest pain or sob. Had recurrent atrial fib with a RVR last night, now back to NSR.  Inpatient Medications    Scheduled Meds: . apixaban  5 mg Oral BID  . furosemide  40 mg Oral q morning - 10a  . metoprolol succinate  100 mg Oral Daily  . pantoprazole  40 mg Oral Daily  . ramipril  10 mg Oral Daily  . spironolactone  25 mg Oral Daily   Continuous Infusions: . amiodarone 30 mg/hr (06/09/17 0700)   PRN Meds: acetaminophen, Influenza vac split quadrivalent PF, ondansetron (ZOFRAN) IV, perflutren lipid microspheres (DEFINITY) IV suspension, pneumococcal 23 valent vaccine, zolpidem   Vital Signs    Vitals:   06/09/17 0400 06/09/17 0500 06/09/17 0600 06/09/17 0700  BP: 102/66 104/68 105/82 111/72  Pulse:      Resp: (!) 25 (!) 24 (!) 26 20  Temp:      TempSrc:      SpO2: 96%     Weight:      Height:        Intake/Output Summary (Last 24 hours) at 06/09/2017 1050 Last data filed at 06/09/2017 0700 Gross per 24 hour  Intake 3133.79 ml  Output 575 ml  Net 2558.79 ml   Filed Weights   06/07/17 1054 06/07/17 1700  Weight: 230 lb (104.3 kg) 225 lb 5 oz (102.2 kg)    Telemetry    NSR with ventricular pacing - Personally Reviewed  ECG    NSR with ventricular pacing - Personally Reviewed  Physical Exam   GEN: No acute distress.   Neck: 6 cm JVD Cardiac: RRR, no murmurs, rubs, or gallops.  Respiratory: Clear to auscultation bilaterally. GI: Soft, nontender, non-distended  MS: No edema; No deformity. Neuro:  Nonfocal  Psych: Normal affect   Labs    Chemistry Recent Labs  Lab 06/06/17 1251 06/07/17 1052 06/08/17 0625 06/09/17 0240  NA 140 136 134*  --   K 4.6 4.1 3.9  --   CL  --  106 103  --   CO2  --  21* 23  --   GLUCOSE 152* 160* 140*  --   BUN  --  16 14  --   CREATININE  --  1.00 0.96  --   CALCIUM  --  9.1 8.8*  --   PROT   --   --   --  6.4*  ALBUMIN  --   --   --  3.1*  AST  --   --   --  18  ALT  --   --   --  25  ALKPHOS  --   --   --  45  BILITOT  --   --   --  2.0*  GFRNONAA  --  >60 >60  --   GFRAA  --  >60 >60  --   ANIONGAP  --  9 8  --      Hematology Recent Labs  Lab 06/07/17 1052 06/08/17 0625 06/09/17 0240  WBC 7.7 9.1 8.5  RBC 4.92 4.74 4.50  HGB 14.0 13.3 12.7*  HCT 43.7 42.1 39.7  MCV 88.8 88.8 88.2  MCH 28.5 28.1 28.2  MCHC 32.0 31.6 32.0  RDW 14.7 14.9 14.8  PLT 224 220 196    Cardiac EnzymesNo results for input(s): TROPONINI in the last 168 hours. No results for input(s):  TROPIPOC in the last 168 hours.   BNPNo results for input(s): BNP, PROBNP in the last 168 hours.   DDimer No results for input(s): DDIMER in the last 168 hours.   Radiology    Dg Chest 2 View  Result Date: 06/07/2017 CLINICAL DATA:  The patient developed a sensation of tachycardia during the night. He underwent cardioversion for atrial fibrillation yesterday. EXAM: CHEST  2 VIEW COMPARISON:  PA and lateral chest 01/25/2010.  CT chest 01/26/2017. FINDINGS: AICD is in place, unchanged. Heart size is normal. Calcification in the left ventricular wall is better visualized on prior CT. Lungs are clear. No pneumothorax or pleural effusion. Aortic atherosclerosis is noted. No acute bony abnormality. IMPRESSION: No acute disease. Atherosclerosis. Electronically Signed   By: Inge Rise M.D.   On: 06/07/2017 11:30    Cardiac Studies   none  Patient Profile     61 y.o. male  Admitted with recurrent VT below his detection and atrial fib with a RVR  Assessment & Plan    1. VT - he has had no recurrent VT. His ICD will be reprogrammed prior to DC earliest of which will be on Monday. 2. Atrial fib - he went back to atrial fib last night and his IV amiodarone was restarted. In NSR now. Will continue IV amiodarone today with plans to transition to oral amio tomorrow. 3. ICD - we will reprogram his device  on Monday. 4. CAD - he denies anginal symptoms since his episode of sustained VT.  For questions or updates, please contact [place] Please consult [email or web address] for contact info under Cardiology/STEMI.      Signed, Cristopher Peru, MD  06/09/2017, 10:50 AM  Patient ID: [name], male   DOB: 08-31-1955, 61 y.o.   MRN: [id number]

## 2017-06-09 NOTE — Progress Notes (Signed)
  Echocardiogram 2D Echocardiogram has been performed.  Shantoya Geurts T Nicolette Gieske 06/09/2017, 10:02 AM

## 2017-06-09 NOTE — Progress Notes (Addendum)
ANTICOAGULATION CONSULT NOTE - Follow Up Consult  Pharmacy Consult for heparin  >> apixaban Indication: chest pain/ACS and atrial fibrillation  Labs: Recent Labs    06/07/17 1052 06/08/17 0625 06/09/17 0240  HGB 14.0 13.3 12.7*  HCT 43.7 42.1 39.7  PLT 224 220 196  APTT  --  34 46*  HEPARINUNFRC  --  0.66 0.81*  CREATININE 1.00 0.96  --     Assessment: 60yo male subtherapeutic on heparin with initial dosing while Eliquis on hold.  Goal of Therapy:  aPTT 66-102 seconds   Plan:  Will increase heparin gtt by 2 units/kg/hr to 1600 units/hr and check PTT in 6hr.  Wynona Neat, PharmD, BCPS  06/09/2017,3:35 AM   ADDENDUM: No longer any plans for cath >> resume Eliquis and d/c heparin at first dose. VB   6:30 AM

## 2017-06-10 LAB — MAGNESIUM: Magnesium: 1.8 mg/dL (ref 1.7–2.4)

## 2017-06-10 LAB — BASIC METABOLIC PANEL
ANION GAP: 8 (ref 5–15)
BUN: 16 mg/dL (ref 6–20)
CALCIUM: 8.8 mg/dL — AB (ref 8.9–10.3)
CO2: 26 mmol/L (ref 22–32)
CREATININE: 0.94 mg/dL (ref 0.61–1.24)
Chloride: 100 mmol/L — ABNORMAL LOW (ref 101–111)
GFR calc Af Amer: >60 mL/min (ref >60)
GLUCOSE: 134 mg/dL — AB (ref 65–99)
Potassium: 3.5 mmol/L (ref 3.5–5.1)
Sodium: 134 mmol/L — ABNORMAL LOW (ref 135–145)

## 2017-06-10 LAB — CBC
HEMATOCRIT: 37.3 % — AB (ref 39.0–52.0)
Hemoglobin: 11.9 g/dL — ABNORMAL LOW (ref 13.0–17.0)
MCH: 28.1 pg (ref 26.0–34.0)
MCHC: 31.9 g/dL (ref 30.0–36.0)
MCV: 88 fL (ref 78.0–100.0)
PLATELETS: 197 10*3/uL (ref 150–400)
RBC: 4.24 MIL/uL (ref 4.22–5.81)
RDW: 15 % (ref 11.5–15.5)
WBC: 8.4 10*3/uL (ref 4.0–10.5)

## 2017-06-10 MED ORDER — AMIODARONE HCL 200 MG PO TABS
400.0000 mg | ORAL_TABLET | Freq: Two times a day (BID) | ORAL | Status: DC
  Administered 2017-06-10 – 2017-06-11 (×3): 400 mg via ORAL
  Filled 2017-06-10 (×3): qty 2

## 2017-06-10 MED ORDER — AMIODARONE HCL IN DEXTROSE 360-4.14 MG/200ML-% IV SOLN: 30.0000 mg/h | INTRAVENOUS | Status: DC

## 2017-06-10 NOTE — Progress Notes (Signed)
Report called to Josph Macho, RN on 3E. Pt transferred to 3E26 via wheelchair, on monitor with RN. All belongings sent with patient.

## 2017-06-10 NOTE — Progress Notes (Signed)
Progress Note  Patient Name: [name] Date of Encounter: 06/10/2017  Primary Cardiologist: Caryl Comes  Subjective   No chest pain or sob.No additional atrial or ventricular arrhythmias  Inpatient Medications    Scheduled Meds: . apixaban  5 mg Oral BID  . furosemide  40 mg Oral q morning - 10a  . metoprolol succinate  100 mg Oral Daily  . pantoprazole  40 mg Oral Daily  . ramipril  10 mg Oral Daily  . spironolactone  25 mg Oral Daily   Continuous Infusions: . amiodarone 30 mg/hr (06/10/17 0154)   PRN Meds: acetaminophen, Influenza vac split quadrivalent PF, ondansetron (ZOFRAN) IV, pneumococcal 23 valent vaccine, zolpidem   Vital Signs    Vitals:   06/10/17 0500 06/10/17 0600 06/10/17 0700 06/10/17 0800  BP: 102/65 120/72 116/69 126/80  Pulse:      Resp: 20 13 17 20   Temp:    97.9 F (36.6 C)  TempSrc:    Oral  SpO2:      Weight:      Height:        Intake/Output Summary (Last 24 hours) at 06/10/2017 0851 Last data filed at 06/10/2017 0700 Gross per 24 hour  Intake 384.1 ml  Output 2105 ml  Net -1720.9 ml   Filed Weights   06/07/17 1054 06/07/17 1700  Weight: 230 lb (104.3 kg) 225 lb 5 oz (102.2 kg)    Telemetry    NSR with ventricular pacing - Personally Reviewed  ECG    none - Personally Reviewed  Physical Exam   GEN: No acute distress.   Neck: 6 cm JVD Cardiac: RRR, no murmurs, rubs, or gallops.  Respiratory: Clear to auscultation bilaterally. GI: Soft, nontender, non-distended  MS: No edema; No deformity. Neuro:  Nonfocal  Psych: Normal affect   Labs    Chemistry Recent Labs  Lab 06/07/17 1052 06/08/17 0625 06/09/17 0240 06/10/17 0310  NA 136 134*  --  134*  K 4.1 3.9  --  3.5  CL 106 103  --  100*  CO2 21* 23  --  26  GLUCOSE 160* 140*  --  134*  BUN 16 14  --  16  CREATININE 1.00 0.96  --  0.94  CALCIUM 9.1 8.8*  --  8.8*  PROT  --   --  6.4*  --   ALBUMIN  --   --  3.1*  --   AST  --   --  18  --   ALT  --   --  25   --   ALKPHOS  --   --  45  --   BILITOT  --   --  2.0*  --   GFRNONAA >60 >60  --  >60  GFRAA >60 >60  --  >60  ANIONGAP 9 8  --  8     Hematology Recent Labs  Lab 06/08/17 0625 06/09/17 0240 06/10/17 0310  WBC 9.1 8.5 8.4  RBC 4.74 4.50 4.24  HGB 13.3 12.7* 11.9*  HCT 42.1 39.7 37.3*  MCV 88.8 88.2 88.0  MCH 28.1 28.2 28.1  MCHC 31.6 32.0 31.9  RDW 14.9 14.8 15.0  PLT 220 196 197    Cardiac EnzymesNo results for input(s): TROPONINI in the last 168 hours. No results for input(s): TROPIPOC in the last 168 hours.   BNPNo results for input(s): BNP, PROBNP in the last 168 hours.   DDimer No results for input(s): DDIMER in the last 168 hours.  Radiology    No results found.  Cardiac Studies   none  Patient Profile     61 y.o. male admitted with VT and with recurrent atrial fib now on IV amio  Assessment & Plan    1. VT - he has had no recurrence. He will transition to oral amiodarone today 2. PAF - he is maintaining NSR. Continue amiodarone 3. ICD - his device will be reprogrammed tomorrow prior to DC home.  4. CAD - he denies anginal symptoms.   For questions or updates, please contact [place] Please consult [email or web address] for contact info under Cardiology/STEMI.      Signed, Cristopher Peru, MD  06/10/2017, 8:51 AM  Patient ID: [name], male   DOB: [birth year], 61 y.o.   MRN: [id number]

## 2017-06-11 ENCOUNTER — Other Ambulatory Visit: Payer: Self-pay | Admitting: Internal Medicine

## 2017-06-11 LAB — CBC
HCT: 38.8 % — ABNORMAL LOW (ref 39.0–52.0)
HEMOGLOBIN: 12.1 g/dL — AB (ref 13.0–17.0)
MCH: 27.7 pg (ref 26.0–34.0)
MCHC: 31.2 g/dL (ref 30.0–36.0)
MCV: 88.8 fL (ref 78.0–100.0)
Platelets: 209 10*3/uL (ref 150–400)
RBC: 4.37 MIL/uL (ref 4.22–5.81)
RDW: 14.8 % (ref 11.5–15.5)
WBC: 7.3 10*3/uL (ref 4.0–10.5)

## 2017-06-11 LAB — BASIC METABOLIC PANEL
Anion gap: 6 (ref 5–15)
BUN: 20 mg/dL (ref 6–20)
CHLORIDE: 100 mmol/L — AB (ref 101–111)
CO2: 29 mmol/L (ref 22–32)
Calcium: 8.8 mg/dL — ABNORMAL LOW (ref 8.9–10.3)
Creatinine, Ser: 1.07 mg/dL (ref 0.61–1.24)
GFR calc Af Amer: >60 mL/min (ref >60)
GFR calc non Af Amer: >60 mL/min (ref >60)
GLUCOSE: 128 mg/dL — AB (ref 65–99)
POTASSIUM: 3.7 mmol/L (ref 3.5–5.1)
SODIUM: 135 mmol/L (ref 135–145)

## 2017-06-11 MED ORDER — AMIODARONE HCL 200 MG PO TABS: 400.0000 mg | ORAL_TABLET | Freq: Two times a day (BID) | ORAL | 0 refills | Status: DC

## 2017-06-11 MED ORDER — NITROGLYCERIN 0.4 MG SL SUBL: 0.4000 mg | SUBLINGUAL_TABLET | SUBLINGUAL | 6 refills | Status: DC | PRN

## 2017-06-11 NOTE — Discharge Summary (Signed)
ELECTROPHYSIOLOGY  DISCHARGE SUMMARY    Patient ID: [name],  MRN: [id number], DOB/AGE: 62/08/1955 62 y.o.  Admit date: 06/07/2017 Discharge date: 06/11/2017  Primary Care Physician: Marton Redwood, MD  Primary Cardiologist: Dr. Haroldine Laws Electrophysiologist: Dr. Caryl Comes  Primary Discharge Diagnosis:  1. VT  Secondary Discharge Diagnosis:  1. Persistent AFib     CHA2DS2Vasc is 3, on eliquis 2. CAD 3. DM  Allergies  Allergen Reactions  . Coreg [Carvedilol] Diarrhea  . Entresto [Sacubitril-Valsartan] Other (See Comments)    Joint pain  . Jardiance [Empagliflozin] Other (See Comments)    Possible afib, definitely cost  . Lipitor [Atorvastatin] Diarrhea     Procedures This Admission:  None   Brief HPI: [name] is a 61 y.o. male with a history of ICM, CHF, atrial fibrillation status post DCCV 06/06/17 admitted to [facility] with c/o palpitations, remote transmission noted VT and advised to come to the ER.  Hospital Course:  The patient was admitted, he had no c/o CP, no near syncope or syncope, on arrival to the ER he was in a hemodynamically stable VT with intermittent CRT pacing and started on amiodarone gtt.  CHF team/his primary cardiologist was brought on board.  Was felt he would likely benefit from cath/ischemic evaluation, though given he had just had DCCV unable to interrupt his a/c and is planned to be arranged out patient.  He has follow up in place with Dr. Haroldine Laws.  He was transitioned to oral amiodarone at loading dose of 400mg  BID will be for 2 weeks, then 400mg  daily for a month, then 200mg  daily.  He has not had recurrent VT.  He did have a brief period of AFib, though remains back in SR.  His device was reprogrammed at the direction of Dr. Caryl Comes this AM to include a VT zone 135-179 ATP therapies only given hx of AF shocks.   The patient was examined by Dr. Caryl Comes and considered stable for discharge to home. EP follow up has been arranged as well.  The  patient was made aware of  law, no driving 6 months.    Physical Exam: Vitals:   06/10/17 2118 06/11/17 0029 06/11/17 0710 06/11/17 0940  BP: 125/77 (!) 92/47 110/82 129/63  Pulse: 71 72 70 69  Resp: 20 18 18    Temp: 97.9 F (36.6 C) (!) 97.2 F (36.2 C) 98.2 F (36.8 C)   TempSrc: Oral Oral Oral   SpO2: 96% 96% 98%   Weight: 224 lb (101.6 kg)  223 lb 11.2 oz (101.5 kg)   Height: 6\' 4"  (1.93 m)       Labs:   Lab Results  Component Value Date   WBC 7.3 06/11/2017   HGB 12.1 (L) 06/11/2017   HCT 38.8 (L) 06/11/2017   MCV 88.8 06/11/2017   PLT 209 06/11/2017    Recent Labs  Lab 06/09/17 0240  06/11/17 0602  NA  --    < > 135  K  --    < > 3.7  CL  --    < > 100*  CO2  --    < > 29  BUN  --    < > 20  CREATININE  --    < > 1.07  CALCIUM  --    < > 8.8*  PROT 6.4*  --   --   BILITOT 2.0*  --   --   ALKPHOS 45  --   --   ALT 25  --   --  AST 18  --   --   GLUCOSE  --    < > 128*   < > = values in this interval not displayed.    Discharge Medications:  Allergies as of 06/11/2017      Reactions   Coreg [carvedilol] Diarrhea   Entresto [sacubitril-valsartan] Other (See Comments)   Joint pain   Jardiance [empagliflozin] Other (See Comments)   Possible afib, definitely cost   Lipitor [atorvastatin] Diarrhea      Medication List    TAKE these medications   acetaminophen 500 MG tablet Commonly known as:  TYLENOL Take 1,000 mg by mouth daily as needed for moderate pain.   amiodarone 200 MG tablet Commonly known as:  PACERONE Take 2 tablets (400 mg total) by mouth 2 (two) times daily. Start at 400mg  (2 tablets) twice daily for 2 weeks, then to 400mg  (2 tablets) once daily for 1 month, then reduce to 200mg  (1 tablet) by mouth once daily   apixaban 5 MG Tabs tablet Commonly known as:  ELIQUIS Take 1 tablet (5 mg total) 2 (two) times daily by mouth.   esomeprazole 20 MG capsule Commonly known as:  NEXIUM Take 20 mg by mouth daily.   furosemide 40  MG tablet Commonly known as:  LASIX Take 1 tablet (40 mg total) by mouth every morning.   glimepiride 4 MG tablet Commonly known as:  AMARYL Take 4 mg by mouth every evening.   loperamide 2 MG tablet Commonly known as:  IMODIUM A-D Take 4 mg by mouth daily as needed for diarrhea or loose stools.   metoprolol succinate 100 MG 24 hr tablet Commonly known as:  TOPROL-XL Take 1 tablet (100 mg total) by mouth daily. Take with or immediately following a meal.   nitroGLYCERIN 0.4 MG SL tablet Commonly known as:  NITROSTAT Place 1 tablet (0.4 mg total) under the tongue every 5 (five) minutes as needed for chest pain.   ramipril 10 MG capsule Commonly known as:  ALTACE Take 1 capsule (10 mg total) by mouth daily.   spironolactone 25 MG tablet Commonly known as:  ALDACTONE TAKE ONE TABLET BY MOUTH ONCE DAILY IN THE MORNING   VISINE OP Apply 3 drops to eye daily as needed (dry eyes).   zolpidem 12.5 MG CR tablet Commonly known as:  AMBIEN CR Take 12.5 mg by mouth at bedtime.   ZZZQUIL 50 MG/30ML Liqd Generic drug:  DiphenhydrAMINE HCl Take 35 mg by mouth at bedtime as needed (sleep).       Disposition:  Home  Discharge Instructions    Diet - low sodium heart healthy   Complete by:  As directed    Increase activity slowly   Complete by:  As directed      Follow-up Information    Bensimhon, Shaune Pascal, MD. Go on 06/19/2017.   Specialty:  Cardiology Why:  12:00 PM, [facility], parking code [employer] information: [address] [place] [address] [phone]        Deboraha Sprang, MD Follow up on 07/18/2017.   Specialty:  Cardiology Why:  11:45AM  Contact information: [address] [address] [phone]        Marton Redwood, MD Follow up.   Specialty:  Internal Medicine Why:  Office will call patient  Contact information: [address] [place] [address] [phone]             Duration of Discharge  Encounter: Greater than 30 minutes including physician time.  Venetia Night, PA-C 06/11/2017 11:40 AM

## 2017-06-11 NOTE — Progress Notes (Signed)
Progress Note  Patient Name: [name] Date of Encounter: 06/11/2017  Primary Cardiologist: Caryl Comes   Patient Profile     61 y.o. male admitted 11/29 with sustained VT @ 164  Hx  congestive heart failure; ischemic cardiomyopathy with an EF of 20%. He is status post CRT-D. Also has persistent afib and  DCCV 11/28 Echo 5/14  EF 20% Started on amio, apixoban>heparin with plans for cath; elected to do as outpt       Subjective   Feels better and withiout chest pain palps  Inpatient Medications    Scheduled Meds: . amiodarone  400 mg Oral BID  . apixaban  5 mg Oral BID  . furosemide  40 mg Oral q morning - 10a  . metoprolol succinate  100 mg Oral Daily  . pantoprazole  40 mg Oral Daily  . ramipril  10 mg Oral Daily  . spironolactone  25 mg Oral Daily   Continuous Infusions: . amiodarone Stopped (06/10/17 1100)   PRN Meds: acetaminophen, Influenza vac split quadrivalent PF, ondansetron (ZOFRAN) IV, pneumococcal 23 valent vaccine, zolpidem   Vital Signs    Vitals:   06/10/17 1700 06/10/17 2118 06/11/17 0029 06/11/17 0710  BP:  125/77 (!) 92/47 110/82  Pulse:  71 72 70  Resp: (!) 21 20 18 18   Temp:  97.9 F (36.6 C) (!) 97.2 F (36.2 C) 98.2 F (36.8 C)  TempSrc:  Oral Oral Oral  SpO2:  96% 96% 98%  Weight:  224 lb (101.6 kg)  223 lb 11.2 oz (101.5 kg)  Height:  6\' 4"  (1.93 m)      Intake/Output Summary (Last 24 hours) at 06/11/2017 0812 Last data filed at 06/11/2017 0000 Gross per 24 hour  Intake 1010.1 ml  Output 725 ml  Net 285.1 ml   Filed Weights   06/07/17 1700 06/10/17 2118 06/11/17 0710  Weight: 225 lb 5 oz (102.2 kg) 224 lb (101.6 kg) 223 lb 11.2 oz (101.5 kg)    Telemetry    NSR without PVC AFib   ECG    none - Personally Reviewed  Physical Exam   Well developed and nourished in no acute distress HENT normal Neck supple with JVP-flat Carotids brisk and full without bruits Clear Regular rate and rhythm, no murmurs or  gallops Abd-soft with active BS without hepatomegaly No Clubbing cyanosis edema Skin-warm and dry A & Oriented  Grossly normal sensory and motor function   Labs    Chemistry Recent Labs  Lab 06/08/17 0625 06/09/17 0240 06/10/17 0310 06/11/17 0602  NA 134*  --  134* 135  K 3.9  --  3.5 3.7  CL 103  --  100* 100*  CO2 23  --  26 29  GLUCOSE 140*  --  134* 128*  BUN 14  --  16 20  CREATININE 0.96  --  0.94 1.07  CALCIUM 8.8*  --  8.8* 8.8*  PROT  --  6.4*  --   --   ALBUMIN  --  3.1*  --   --   AST  --  18  --   --   ALT  --  25  --   --   ALKPHOS  --  45  --   --   BILITOT  --  2.0*  --   --   GFRNONAA >60  --  >60 >60  GFRAA >60  --  >60 >60  ANIONGAP 8  --  8 6  Hematology Recent Labs  Lab 06/09/17 0240 06/10/17 0310 06/11/17 0602  WBC 8.5 8.4 7.3  RBC 4.50 4.24 4.37  HGB 12.7* 11.9* 12.1*  HCT 39.7 37.3* 38.8*  MCV 88.2 88.0 88.8  MCH 28.2 28.1 27.7  MCHC 32.0 31.9 31.2  RDW 14.8 15.0 14.8  PLT 196 197 209    Cardiac EnzymesNo results for input(s): TROPONINI in the last 168 hours. No results for input(s): TROPIPOC in the last 168 hours.   BNPNo results for input(s): BNP, PROBNP in the last 168 hours.   DDimer No results for input(s): DDIMER in the last 168 hours.   Radiology    No results found.  Cardiac Studies   none   Assessment & Plan      Ventricular Tachycardia  Atrial Fib  Ischemic Cardiomyopathy  ICD      We will discharge him on amiodarone for ventricular tachycardia and atrial fibrillation suppression.  400 mg twice daily for 2 weeks and 40 mg a day for a month and then 200 mg a day.  He has follow-up scheduled with the heart failure service next week and will see him in 1 month.  The plan is for right and left heart catheterization following his cardioversion cycle.  He is reminded about driving restrictions.    Signed, [name], MD  06/11/2017, 8:12 AM  Patient ID: [name], male   DOB: [birth year], 61  y.o.   MRN: [id number]

## 2017-06-11 NOTE — Care Management Important Message (Signed)
Important Message  Patient Details  Name: [name] MRN: [id number] Date of Birth: Oct 28, 1955   Medicare Important Message Given:  Yes    Leomia Blake 06/11/2017, 1:45 PM

## 2017-06-11 NOTE — Progress Notes (Signed)
Advanced Heart Failure Rounding Note  PCP:  Primary Cardiologist: Dr Haroldine Laws.   Subjective:   No events over the weekend. Continued to load on amioadarone. Remains in NSR.  Denies CP/SOB.    Objective:   Weight Range: 223 lb 11.2 oz (101.5 kg) Body mass index is 27.23 kg/m.   Vital Signs:   Temp:  [97.2 F (36.2 C)-98.2 F (36.8 C)] 98.2 F (36.8 C) (12/03 0710) Pulse Rate:  [70-75] 70 (12/03 0710) Resp:  [15-31] 18 (12/03 0710) BP: (92-125)/(47-84) 110/82 (12/03 0710) SpO2:  [94 %-98 %] 98 % (12/03 0710) Weight:  [223 lb 11.2 oz (101.5 kg)-224 lb (101.6 kg)] 223 lb 11.2 oz (101.5 kg) (12/03 0710) Last BM Date: 06/10/17  Weight change: Filed Weights   06/07/17 1700 06/10/17 2118 06/11/17 0710  Weight: 225 lb 5 oz (102.2 kg) 224 lb (101.6 kg) 223 lb 11.2 oz (101.5 kg)    Intake/Output:   Intake/Output Summary (Last 24 hours) at 06/11/2017 0804 Last data filed at 06/11/2017 0000 Gross per 24 hour  Intake 1010.1 ml  Output 725 ml  Net 285.1 ml      Physical Exam    General:  Well appearing. No resp difficulty HEENT: Normal Neck: Supple. JVP 5-6  . Carotids 2+ bilat; no bruits. No lymphadenopathy or thyromegaly appreciated. Cor: PMI laterally displaced. Regular rate & rhythm. No rubs, gallops or murmurs. Lungs: Clear Abdomen: Soft, nontender, nondistended. No hepatosplenomegaly. No bruits or masses. Good bowel sounds. Extremities: No cyanosis, clubbing, rash, edema Neuro: Alert & orientedx3, cranial nerves grossly intact. moves all 4 extremities w/o difficulty. Affect pleasant   Telemetry  NSR with v-Paced 50s   EKG    n/a  Labs    CBC Recent Labs    06/10/17 0310 06/11/17 0602  WBC 8.4 7.3  HGB 11.9* 12.1*  HCT 37.3* 38.8*  MCV 88.0 88.8  PLT 197 213   Basic Metabolic Panel Recent Labs    06/10/17 0310 06/11/17 0602  NA 134* 135  K 3.5 3.7  CL 100* 100*  CO2 26 29  GLUCOSE 134* 128*  BUN 16 20  CREATININE 0.94 1.07  CALCIUM  8.8* 8.8*  MG 1.8  --    Liver Function Tests Recent Labs    06/09/17 0240  AST 18  ALT 25  ALKPHOS 45  BILITOT 2.0*  PROT 6.4*  ALBUMIN 3.1*   No results for input(s): LIPASE, AMYLASE in the last 72 hours. Cardiac Enzymes No results for input(s): CKTOTAL, CKMB, CKMBINDEX, TROPONINI in the last 72 hours.  BNP: BNP (last 3 results) No results for input(s): BNP in the last 8760 hours.  ProBNP (last 3 results) No results for input(s): PROBNP in the last 8760 hours.   D-Dimer No results for input(s): DDIMER in the last 72 hours. Hemoglobin A1C No results for input(s): HGBA1C in the last 72 hours. Fasting Lipid Panel No results for input(s): CHOL, HDL, LDLCALC, TRIG, CHOLHDL, LDLDIRECT in the last 72 hours. Thyroid Function Tests Recent Labs    06/09/17 0240  TSH 2.404    Other results:   Imaging     No results found.   Medications:     Scheduled Medications: . amiodarone  400 mg Oral BID  . apixaban  5 mg Oral BID  . furosemide  40 mg Oral q morning - 10a  . metoprolol succinate  100 mg Oral Daily  . pantoprazole  40 mg Oral Daily  . ramipril  10 mg Oral  Daily  . spironolactone  25 mg Oral Daily     Infusions: . amiodarone Stopped (06/10/17 1100)     PRN Medications:  acetaminophen, Influenza vac split quadrivalent PF, ondansetron (ZOFRAN) IV, pneumococcal 23 valent vaccine, zolpidem    Patient Profile  Mr Weible is a 61 year old with h/o ICM, chronic systolic heart failure, biv, DM, HTN, CAD with previous stents,  SVT, and Afib.   Presented to [facility] ED with VT    Assessment/Plan  1. VT on admit  Continue po amio.   Has ICD No driving 6 months  2. PAF S/P DC-CV 11/28. Had brief recurrence of AF on Friday but has maintained NSR over weekend on amio per EP.  Continue eliquis 5 mg twice daily.   3. Chronic Systolic Heart Failure due to ischemic cardiomyopathy ECHO 12/1 EV 20-25% RV moderately dilated. Peak PA pressure 59 mm hg    -Volume status stable  -Continue toprol xl 100 mg daily., -Continue ramipril 10 mg daily. He is intolerant entresto due to joint pain.    Continue spironolactone 25 mg daily and lasix 40 mg daily.  4. CAD On bb.  Intolerant lipitor.   5. DMII  Follow up in the HF clinic set up. Cath 2-3 weeks per Dr Haroldine Laws. Follow up 06/19/17 at 1200 Tuesday   Medication concerns reviewed with patient and pharmacy team. Barriers identified: Cost of amiodarone.   Length of Stay: 4  Amy Clegg, NP  06/11/2017, 8:04 AM  Advanced Heart Failure Team Pager [phone] (M-F; 7a - 4p)  Please contact [facility] for night-coverage after hours (4p -7a ) and weekends on [email or web address]  Patient seen and examined with Darrick Grinder, NP. We discussed all aspects of the encounter. I agree with the assessment and plan as stated above.   Remains in NSR. No further VT. Denies CP or SOB. Volume status looks good. Likely home today but willdefer to EP.  We will see him back in the office in several weeks and can consider cath (after 4 weeks of AC) to assess for worsening CAD as substrate for VT.   Glori Bickers, MD  9:13 AM

## 2017-06-11 NOTE — Discharge Instructions (Signed)
NO DRIVING FOR 6 MONTHS °

## 2017-06-11 NOTE — Progress Notes (Signed)
Discharge instructions (including medications) discussed with and copy provided to patient/caregiver along with paper prescription for amiodarone.

## 2017-06-18 LAB — CUP PACEART REMOTE DEVICE CHECK
Battery Remaining Longevity: 42 mo
Brady Statistic RV Percent Paced: 81 %
HIGH POWER IMPEDANCE MEASURED VALUE: 59 Ohm
Implantable Lead Location: 753858
Implantable Lead Model: 4543
Implantable Lead Model: 5076
Implantable Pulse Generator Implant Date: 20110721
Lead Channel Pacing Threshold Amplitude: 0.6 V
Lead Channel Pacing Threshold Amplitude: 0.8 V
Lead Channel Pacing Threshold Amplitude: 0.9 V
Lead Channel Pacing Threshold Pulse Width: 0.4 ms
Lead Channel Pacing Threshold Pulse Width: 0.4 ms
Lead Channel Setting Pacing Amplitude: 2 V
Lead Channel Setting Pacing Amplitude: 2.4 V
Lead Channel Setting Pacing Pulse Width: 0.4 ms
Lead Channel Setting Pacing Pulse Width: 0.4 ms
MDC IDC LEAD IMPLANT DT: 20051207
MDC IDC LEAD IMPLANT DT: 20051207
MDC IDC LEAD IMPLANT DT: 20110721
MDC IDC LEAD LOCATION: 753859
MDC IDC LEAD LOCATION: 753860
MDC IDC LEAD SERIAL: 148938
MDC IDC LEAD SERIAL: 205859
MDC IDC MSMT BATTERY REMAINING PERCENTAGE: 62 %
MDC IDC MSMT LEADCHNL LV IMPEDANCE VALUE: 829 Ohm
MDC IDC MSMT LEADCHNL LV PACING THRESHOLD PULSEWIDTH: 0.4 ms
MDC IDC MSMT LEADCHNL RA IMPEDANCE VALUE: 454 Ohm
MDC IDC MSMT LEADCHNL RV IMPEDANCE VALUE: 431 Ohm
MDC IDC SESS DTM: 20181127085200
MDC IDC SET LEADCHNL LV PACING AMPLITUDE: 1.9 V
MDC IDC SET LEADCHNL LV SENSING SENSITIVITY: 1 mV
MDC IDC SET LEADCHNL RV SENSING SENSITIVITY: 0.5 mV
MDC IDC STAT BRADY RA PERCENT PACED: 0 %
Pulse Gen Serial Number: 139171

## 2017-06-19 ENCOUNTER — Encounter (HOSPITAL_COMMUNITY): Payer: Medicare Other | Admitting: Internal Medicine

## 2017-06-22 ENCOUNTER — Ambulatory Visit (HOSPITAL_COMMUNITY)
Admission: RE | Admit: 2017-06-22 | Discharge: 2017-06-22 | Disposition: A | Discharge status: Home / Self Care | Payer: Medicare Other | Source: Ambulatory Visit | Attending: Internal Medicine | Admitting: Internal Medicine

## 2017-06-22 VITALS — BP 116/60 | HR 63 | Wt 227.5 lb

## 2017-06-22 DIAGNOSIS — E119 Type 2 diabetes mellitus without complications: Secondary | ICD-10-CM | POA: Insufficient documentation

## 2017-06-22 DIAGNOSIS — Z86010 Personal history of colonic polyps: Secondary | ICD-10-CM | POA: Insufficient documentation

## 2017-06-22 DIAGNOSIS — I5022 Chronic systolic (congestive) heart failure: Secondary | ICD-10-CM

## 2017-06-22 DIAGNOSIS — G4700 Insomnia, unspecified: Secondary | ICD-10-CM | POA: Diagnosis not present

## 2017-06-22 DIAGNOSIS — E669 Obesity, unspecified: Secondary | ICD-10-CM | POA: Diagnosis not present

## 2017-06-22 DIAGNOSIS — K219 Gastro-esophageal reflux disease without esophagitis: Secondary | ICD-10-CM | POA: Insufficient documentation

## 2017-06-22 DIAGNOSIS — I471 Supraventricular tachycardia: Secondary | ICD-10-CM | POA: Diagnosis not present

## 2017-06-22 DIAGNOSIS — E785 Hyperlipidemia, unspecified: Secondary | ICD-10-CM | POA: Insufficient documentation

## 2017-06-22 DIAGNOSIS — I2510 Atherosclerotic heart disease of native coronary artery without angina pectoris: Secondary | ICD-10-CM

## 2017-06-22 DIAGNOSIS — I480 Paroxysmal atrial fibrillation: Secondary | ICD-10-CM

## 2017-06-22 DIAGNOSIS — I110 Hypertensive heart disease with heart failure: Secondary | ICD-10-CM | POA: Diagnosis not present

## 2017-06-22 DIAGNOSIS — Z9889 Other specified postprocedural states: Secondary | ICD-10-CM | POA: Diagnosis not present

## 2017-06-22 DIAGNOSIS — I255 Ischemic cardiomyopathy: Secondary | ICD-10-CM | POA: Insufficient documentation

## 2017-06-22 DIAGNOSIS — Z7984 Long term (current) use of oral hypoglycemic drugs: Secondary | ICD-10-CM | POA: Diagnosis not present

## 2017-06-22 DIAGNOSIS — R0602 Shortness of breath: Secondary | ICD-10-CM | POA: Diagnosis not present

## 2017-06-22 DIAGNOSIS — I252 Old myocardial infarction: Secondary | ICD-10-CM | POA: Insufficient documentation

## 2017-06-22 DIAGNOSIS — I472 Ventricular tachycardia, unspecified: Secondary | ICD-10-CM

## 2017-06-22 DIAGNOSIS — I481 Persistent atrial fibrillation: Secondary | ICD-10-CM | POA: Diagnosis not present

## 2017-06-22 DIAGNOSIS — Z95810 Presence of automatic (implantable) cardiac defibrillator: Secondary | ICD-10-CM | POA: Diagnosis not present

## 2017-06-22 DIAGNOSIS — Z79899 Other long term (current) drug therapy: Secondary | ICD-10-CM | POA: Diagnosis not present

## 2017-06-22 DIAGNOSIS — I447 Left bundle-branch block, unspecified: Secondary | ICD-10-CM | POA: Insufficient documentation

## 2017-06-22 LAB — BASIC METABOLIC PANEL
Anion gap: 10 (ref 5–15)
BUN: 15 mg/dL (ref 6–20)
CALCIUM: 8.9 mg/dL (ref 8.9–10.3)
CO2: 23 mmol/L (ref 22–32)
CREATININE: 1.02 mg/dL (ref 0.61–1.24)
Chloride: 105 mmol/L (ref 101–111)
GFR calc Af Amer: >60 mL/min (ref >60)
GFR calc non Af Amer: >60 mL/min (ref >60)
GLUCOSE: 110 mg/dL — AB (ref 65–99)
Potassium: 3.8 mmol/L (ref 3.5–5.1)
Sodium: 138 mmol/L (ref 135–145)

## 2017-06-22 LAB — CBC
HCT: 44.3 % (ref 39.0–52.0)
Hemoglobin: 14.2 g/dL (ref 13.0–17.0)
MCH: 28.7 pg (ref 26.0–34.0)
MCHC: 32.1 g/dL (ref 30.0–36.0)
MCV: 89.7 fL (ref 78.0–100.0)
PLATELETS: 243 10*3/uL (ref 150–400)
RBC: 4.94 MIL/uL (ref 4.22–5.81)
RDW: 15.8 % — ABNORMAL HIGH (ref 11.5–15.5)
WBC: 6.8 10*3/uL (ref 4.0–10.5)

## 2017-06-22 LAB — T4, FREE: Free T4: 1.13 ng/dL — ABNORMAL HIGH (ref 0.61–1.12)

## 2017-06-22 LAB — TSH: TSH: 4.028 u[IU]/mL (ref 0.350–4.500)

## 2017-06-22 MED ORDER — SACUBITRIL-VALSARTAN 49-51 MG PO TABS: 1.0000 | ORAL_TABLET | Freq: Two times a day (BID) | ORAL | 3 refills | Status: DC

## 2017-06-22 NOTE — Patient Instructions (Addendum)
Stop Ramipril  Start Entresto 49/51 mg Twice daily STARTING ON Sunday 12/16 AM  Labs today  Your physician has recommended that you have a cardiopulmonary stress test (CPX). CPX testing is a non-invasive measurement of heart and lung function. It replaces a traditional treadmill stress test. This type of test provides a tremendous amount of information that relates not only to your present condition but also for future outcomes. This test combines measurements of you ventilation, respiratory gas exchange in the lungs, electrocardiogram (EKG), blood pressure and physical response before, during, and following an exercise protocol.  Your physician recommends that you schedule a follow-up appointment in: 3-4 weeks

## 2017-06-22 NOTE — Progress Notes (Signed)
Medication Samples have been provided to the patient.  Drug name: Delene Loll       Strength: 49/51mg         Qty: 2  LOT: [id number] Exp.Date: 1/21  Dosing instructions: 1 tab Twice daily   The patient has been instructed regarding the correct time, dose, and frequency of taking this medication, including desired effects and most common side effects.   Eland Lamantia 1:03 PM 06/22/2017

## 2017-06-22 NOTE — Progress Notes (Signed)
Patient ID: [name], male   DOB: 1955-07-18, 61 y.o.   MRN: [id number]   ADVANCED HF CLINIC NOTE  PCP: Dr Brigitte Pulse EP: Dr Caryl Comes Cardiologist: Dr Haroldine Laws  HPI: [name] is a 61 year old male with history of chronic systolic heart failure EF 20% 2009, ICM s/p 2 myocardial infarctions and previous stents, [employer] BiVICD in place, S/P device generator replacement in July 2011, DM,  obesity, HTN, and a low HDL. Disabled since 2011.   Myoview 7/11 which showed EF 17% with markedly dilated LV  EDV: 509 ml ESV: 422 ml. Extensive scar with trivial peri-infarct ischemia.  CPX  12/11 pVO2 24.5 slope 31.7 RER 1.12 O2 pulse 105%   In April 2011 received 7 ICD shocks for SVT and now has PTSD from the event.Tried increasing carvedilol in past, but did not tolerate due to diarrhea.   Echo 5/14: EF 20%. RV normal.   Has tried Entresto in past and stopped due to severe elbow pain. Recently Dr. Brigitte Pulse started [place] and he was peeing a lot and relates it to him having AF. So he stopped.   Developed recurrent AF and underwent outpatient DC-CV on 11/28.Marland Kitchen He was feeling ok but developed tachycardia and he called EP and transmission showed VT. He presented to [facility] in [place] and was loaded on amiodarone which broke VT. Echo 06/09/17 EF 20-25%. Was in/out of AF prior to d/c. Was scheduled for cath to look for ischemic reason for VT but deferred to recent DC-CV  Says since discharge he has been more dyspneic. Struggling to walk to mailbox due to SOB.  No orthopnea, PND or edema. Denies palpitations. Weight stable.Feels tired. No palpitations, presyncope or CP.   ROS: All systems negative except as listed in HPI, PMH and Problem List.  Past Medical History:  Diagnosis Date  . CAD (coronary artery disease) last 2005   multiple MIs  . CHF (congestive heart failure) (HCC)    secondary to severe ischemic cardiomyopathy.   a.    EF 20% with akinesis of the distal half to two-thirds of the ventricle.  Trivial MR   b.      Status post bi-V ICD.   c.     Cardiopulmonary exercise test (12/11) pVO2 of 24.5,VE/VCO2 slope is 31,.7  . Colon polyps   . Diabetes mellitus   . Diverticulosis   . GERD (gastroesophageal reflux disease)   . Headache(784.0)   . Hemorrhoids   . Hyperlipidemia   . 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 Insomnia   . Left bundle branch block   . Persistent atrial fibrillation ([place])   . Prostate neoplasm   . Tinnitus    comes and goes    Current Outpatient Medications  Medication Sig Dispense Refill  . acetaminophen (TYLENOL) 500 MG tablet Take 1,000 mg by mouth daily as needed for moderate pain.    Marland Kitchen amiodarone (PACERONE) 200 MG tablet Take 2 tablets (400 mg total) by mouth 2 (two) times daily. Start at 400mg  (2 tablets) twice daily for 2 weeks, then to 400mg  (2 tablets) once daily for 1 month, then reduce to 200mg  (1 tablet) by mouth once daily 146 tablet 0  . apixaban (ELIQUIS) 5 MG TABS tablet Take 1 tablet (5 mg total) 2 (two) times daily by mouth. 60 tablet 5  . DiphenhydrAMINE HCl (ZZZQUIL) 50 MG/30ML LIQD Take 35 mg by mouth at bedtime as needed (sleep).    Marland Kitchen esomeprazole ([place])  20 MG capsule Take 20 mg by mouth daily.     . furosemide (LASIX) 40 MG tablet Take 1 tablet (40 mg total) by mouth every morning. 90 tablet 3  . glimepiride (AMARYL) 4 MG tablet Take 4 mg by mouth every evening.     . loperamide (IMODIUM A-D) 2 MG tablet Take 4 mg by mouth daily as needed for diarrhea or loose stools.    . metoprolol succinate (TOPROL-XL) 100 MG 24 hr tablet TAKE ONE TABLET BY MOUTH ONCE DAILY. TAKE WITH FOOD OR IMMEDIATELY FOLLOWING A MEAL 90 tablet 0  . nitroGLYCERIN (NITROSTAT) 0.4 MG SL tablet Place 1 tablet (0.4 mg total) under the tongue every 5 (five) minutes as needed for chest pain. 25 tablet 6  . ramipril (ALTACE) 10 MG capsule TAKE ONE CAPSULE BY MOUTH ONCE DAILY 90 capsule 0  . spironolactone (ALDACTONE) 25 MG tablet TAKE ONE  TABLET BY MOUTH ONCE DAILY IN THE MORNING 90 tablet 3  . Tetrahydrozoline HCl (VISINE OP) Apply 3 drops to eye daily as needed (dry eyes).    Marland Kitchen zolpidem (AMBIEN CR) 12.5 MG CR tablet Take 12.5 mg by mouth at bedtime.      No current facility-administered medications for this encounter.      PHYSICAL EXAM: Vitals:   06/22/17 1127  BP: 116/60  Pulse: 63  SpO2: 96%   Filed Weights   06/22/17 1127  Weight: 227 lb 8 oz (103.2 kg)   General:   General:  Walked into clinic. No resp difficulty HEENT: normal Neck: supple. no JVD. Carotids 2+ bilat; no bruits. No lymphadenopathy or thryomegaly appreciated. Cor: PMI laterally displaced. Regular rate & rhythm. No rubs, gallops or murmurs. Lungs: clear Abdomen: soft, nontender, nondistended. No hepatosplenomegaly. No bruits or masses. Good bowel sounds. Extremities: no cyanosis, clubbing, rash, edema Neuro: alert & orientedx3, cranial nerves grossly intact. moves all 4 extremities w/o difficulty. Affect pleasant  ASSESSMENT & PLAN: 1. Chronic systolic HF due to iCM EF 20% - Now with worsening NYHA III-IIIB - Volume status ok - Will retry switching ramipril to [employer] 49/51 - Continue current dose to toprol xl and spiro 25 - Plan CPX - Will likely need R/L cath in the near future - We discussed possibility of advanced therapies and he was clear that he "wants to live" and will consider any therapies we feel are necessary - Check labs  2. CAD - Recent VT has raised concerned for increasing ischemic substrate - Currently denies CP. Cath deferred due to recent DC-CV. Will likely need R/L cath in several weeks - He refuses statin due to GI upset. Consider referral to lipid clinic for PCSK-9  3. PAF - Status post recent DC-CV. Subsequently in/ou of AF and amio started - ICD interrogated personally in clinic and maintaining NSR. - Continue amio and apixaban  4. VT - ICD interrogated. One episode of NSVT on 12/3. Otherwise no events  since hospitalization. Remains on amio.   5. DM2 - Per PCP. Did not tolerate Jardiance. I reviewed the recent literature with him and he will reconsider  6. HL - Refuses statin due to GI upset. .   Total time spent 45 minutes. Over half that time spent discussing above.    Glori Bickers, MD 12:08 PM

## 2017-07-05 ENCOUNTER — Encounter (HOSPITAL_COMMUNITY): Payer: Medicare Other

## 2017-07-13 ENCOUNTER — Ambulatory Visit (HOSPITAL_COMMUNITY): Payer: Medicare Other | Attending: Internal Medicine

## 2017-07-13 DIAGNOSIS — I5022 Chronic systolic (congestive) heart failure: Secondary | ICD-10-CM | POA: Diagnosis not present

## 2017-07-13 DIAGNOSIS — I4589 Other specified conduction disorders: Secondary | ICD-10-CM | POA: Diagnosis not present

## 2017-07-16 ENCOUNTER — Encounter (HOSPITAL_COMMUNITY): Payer: Self-pay | Admitting: Internal Medicine

## 2017-07-16 ENCOUNTER — Other Ambulatory Visit: Payer: Self-pay

## 2017-07-16 ENCOUNTER — Encounter (HOSPITAL_COMMUNITY): Payer: Self-pay | Admitting: *Deleted

## 2017-07-16 ENCOUNTER — Other Ambulatory Visit (HOSPITAL_COMMUNITY): Payer: Self-pay | Admitting: *Deleted

## 2017-07-16 ENCOUNTER — Ambulatory Visit (HOSPITAL_COMMUNITY)
Admission: RE | Admit: 2017-07-16 | Discharge: 2017-07-16 | Disposition: A | Discharge status: Home / Self Care | Payer: Medicare Other | Source: Ambulatory Visit | Attending: Internal Medicine | Admitting: Internal Medicine

## 2017-07-16 VITALS — BP 110/58 | HR 63 | Wt 225.2 lb

## 2017-07-16 DIAGNOSIS — E669 Obesity, unspecified: Secondary | ICD-10-CM | POA: Diagnosis not present

## 2017-07-16 DIAGNOSIS — Z7984 Long term (current) use of oral hypoglycemic drugs: Secondary | ICD-10-CM | POA: Diagnosis not present

## 2017-07-16 DIAGNOSIS — I2510 Atherosclerotic heart disease of native coronary artery without angina pectoris: Secondary | ICD-10-CM

## 2017-07-16 DIAGNOSIS — I447 Left bundle-branch block, unspecified: Secondary | ICD-10-CM | POA: Insufficient documentation

## 2017-07-16 DIAGNOSIS — I5022 Chronic systolic (congestive) heart failure: Secondary | ICD-10-CM

## 2017-07-16 DIAGNOSIS — E119 Type 2 diabetes mellitus without complications: Secondary | ICD-10-CM | POA: Insufficient documentation

## 2017-07-16 DIAGNOSIS — Z95810 Presence of automatic (implantable) cardiac defibrillator: Secondary | ICD-10-CM | POA: Insufficient documentation

## 2017-07-16 DIAGNOSIS — I472 Ventricular tachycardia, unspecified: Secondary | ICD-10-CM

## 2017-07-16 DIAGNOSIS — G4700 Insomnia, unspecified: Secondary | ICD-10-CM | POA: Diagnosis not present

## 2017-07-16 DIAGNOSIS — I252 Old myocardial infarction: Secondary | ICD-10-CM | POA: Insufficient documentation

## 2017-07-16 DIAGNOSIS — E785 Hyperlipidemia, unspecified: Secondary | ICD-10-CM | POA: Insufficient documentation

## 2017-07-16 DIAGNOSIS — I255 Ischemic cardiomyopathy: Secondary | ICD-10-CM | POA: Diagnosis not present

## 2017-07-16 DIAGNOSIS — I110 Hypertensive heart disease with heart failure: Secondary | ICD-10-CM | POA: Diagnosis not present

## 2017-07-16 DIAGNOSIS — Z86010 Personal history of colonic polyps: Secondary | ICD-10-CM | POA: Diagnosis not present

## 2017-07-16 DIAGNOSIS — F4310 Post-traumatic stress disorder, unspecified: Secondary | ICD-10-CM | POA: Diagnosis not present

## 2017-07-16 DIAGNOSIS — K219 Gastro-esophageal reflux disease without esophagitis: Secondary | ICD-10-CM | POA: Insufficient documentation

## 2017-07-16 DIAGNOSIS — Z79899 Other long term (current) drug therapy: Secondary | ICD-10-CM | POA: Insufficient documentation

## 2017-07-16 DIAGNOSIS — I481 Persistent atrial fibrillation: Secondary | ICD-10-CM | POA: Diagnosis not present

## 2017-07-16 DIAGNOSIS — I471 Supraventricular tachycardia: Secondary | ICD-10-CM | POA: Insufficient documentation

## 2017-07-16 DIAGNOSIS — I480 Paroxysmal atrial fibrillation: Secondary | ICD-10-CM

## 2017-07-16 LAB — CBC
HEMATOCRIT: 49.2 % (ref 39.0–52.0)
HEMOGLOBIN: 15.6 g/dL (ref 13.0–17.0)
MCH: 28.1 pg (ref 26.0–34.0)
MCHC: 31.7 g/dL (ref 30.0–36.0)
MCV: 88.5 fL (ref 78.0–100.0)
Platelets: 184 10*3/uL (ref 150–400)
RBC: 5.56 MIL/uL (ref 4.22–5.81)
RDW: 15.5 % (ref 11.5–15.5)
WBC: 5.7 10*3/uL (ref 4.0–10.5)

## 2017-07-16 LAB — BASIC METABOLIC PANEL
ANION GAP: 6 (ref 5–15)
BUN: 15 mg/dL (ref 6–20)
CO2: 23 mmol/L (ref 22–32)
Calcium: 8.9 mg/dL (ref 8.9–10.3)
Chloride: 107 mmol/L (ref 101–111)
Creatinine, Ser: 1.12 mg/dL (ref 0.61–1.24)
Glucose, Bld: 115 mg/dL — ABNORMAL HIGH (ref 65–99)
POTASSIUM: 3.8 mmol/L (ref 3.5–5.1)
SODIUM: 136 mmol/L (ref 135–145)

## 2017-07-16 LAB — PROTIME-INR
INR: 1.02
PROTHROMBIN TIME: 13.3 s (ref 11.4–15.2)

## 2017-07-16 NOTE — Patient Instructions (Signed)
Heart Catheterization on Fri 07/20/17, see instruction sheet  We will contact you in 3 months to schedule your next appointment.

## 2017-07-16 NOTE — Progress Notes (Signed)
Patient ID: [name], male   DOB: August 15, 1955, 62 y.o.   MRN: [id number]   ADVANCED HF CLINIC NOTE  PCP: Dr [name] EP: Dr [name] Cardiologist: Dr [name]  HPI: [name] is a 62 year old male with history of chronic systolic heart failure EF 20% 2009, ICM s/p 2 myocardial infarctions and previous stents, [place] [employer] BiVICD in place, S/P device generator replacement in July 2011, DM,  obesity, HTN, and a low HDL. Disabled since 2011.   Myoview 7/11 which showed EF 17% with markedly dilated LV  EDV: 509 ml ESV: 422 ml. Extensive scar with trivial peri-infarct ischemia.  CPX  12/11 pVO2 24.5 slope 31.7 RER 1.12 O2 [name] 105%   In April 2011 received 7 ICD shocks for SVT and now has PTSD from the event.Tried increasing carvedilol in past, but did not tolerate due to diarrhea.   Echo 5/14: EF 20%. RV normal.   Has tried Entresto in past and stopped due to severe elbow pain. Recently Dr. Brigitte [name] started [place] and he was peeing a lot and relates it to him having AF. So he stopped.   Developed recurrent AF and underwent outpatient DC-CV on 11/28.Marland Kitchen He was feeling ok but developed tachycardia and he called EP and transmission showed VT. He presented to [facility] in [place] and was loaded on amiodarone which broke VT. Echo 06/09/17 EF 20-25%. Was in/out of AF prior to d/c. Was scheduled for cath to look for ischemic reason for VT but deferred to recent DC-CV  Returns for f/u. Had CPX last week with only mild HF limitation. Says he is feeling much better. Able to take garbage out to curb without SOB. No CP, edema, orthopnea or PND. No palpitations or ICD shocks.  Compliant with meds.   ICD interrogated in clinic. No reent VT/VF or AF. Activity level 2.5hr/day    CPX 07/13/16   FVC 4.82 (86%)    FEV1 3.40 (80%)     FEV1/FVC 71 (92%)     MVV 125 (78%)     Resting HR: 58 Peak HR: 113  (72% age predicted max HR) BP rest: 112/70 BP peak: 146/62 Peak VO2: 19.5 (64% predicted peak VO2) VE/VCO2 slope:  33 OUES: 2.24 Peak RER: 1.06  Ventilatory Threshold: 16.3 (53% predicted and 84% measured peak VO2) VE/MVV: 62% O2pulse: 18  (90% predicted O2pulse)  ROS: All systems negative except as listed in HPI, PMH and Problem List.  Past Medical History:  Diagnosis Date  . CAD (coronary artery disease) last 2005   multiple MIs  . CHF (congestive heart failure) (HCC)    secondary to severe ischemic cardiomyopathy.   a.    EF 20% with akinesis of the distal half to two-thirds of the ventricle.  Trivial MR   b.     Status post bi-V ICD.   c.     Cardiopulmonary exercise test (12/11) pVO2 of 24.5,VE/VCO2 slope is 31,.7  . Colon polyps   . Diabetes mellitus   . Diverticulosis   . GERD (gastroesophageal reflux disease)   . Headache(784.0)   . Hemorrhoids   . Hyperlipidemia   . 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 Insomnia   . Left bundle branch block   . Persistent atrial fibrillation ([name])   . Prostate neoplasm   . Tinnitus    [name] and goes    Current Outpatient Medications  Medication Sig Dispense Refill  . acetaminophen (TYLENOL) 500 MG tablet Take 1,000 mg  by mouth daily as needed for moderate pain.    Marland Kitchen amiodarone (PACERONE) 200 MG tablet Take 2 tablets (400 mg total) by mouth 2 (two) times daily. Start at 400mg  (2 tablets) twice daily for 2 weeks, then to 400mg  (2 tablets) once daily for 1 month, then reduce to 200mg  (1 tablet) by mouth once daily 146 tablet 0  . apixaban (ELIQUIS) 5 MG TABS tablet Take 1 tablet (5 mg total) 2 (two) times daily by mouth. 60 tablet 5  . DiphenhydrAMINE HCl (ZZZQUIL) 50 MG/30ML LIQD Take 35 mg by mouth at bedtime as needed (sleep).    Marland Kitchen esomeprazole (NEXIUM) 20 MG capsule Take 20 mg by mouth daily.     . furosemide (LASIX) 40 MG tablet Take 1 tablet (40 mg total) by mouth every morning. 90 tablet 3  . glimepiride (AMARYL) 4 MG tablet Take 4 mg by mouth every evening.     . loperamide (IMODIUM A-D) 2  MG tablet Take 4 mg by mouth daily as needed for diarrhea or loose stools.    . metoprolol succinate (TOPROL-XL) 100 MG 24 hr tablet TAKE ONE TABLET BY MOUTH ONCE DAILY. TAKE WITH FOOD OR IMMEDIATELY FOLLOWING A MEAL 90 tablet 0  . nitroGLYCERIN (NITROSTAT) 0.4 MG SL tablet Place 1 tablet (0.4 mg total) under the tongue every 5 (five) minutes as needed for chest pain. 25 tablet 6  . sacubitril-valsartan (ENTRESTO) 49-51 MG Take 1 tablet by mouth 2 (two) times daily. 60 tablet 3  . spironolactone (ALDACTONE) 25 MG tablet TAKE ONE TABLET BY MOUTH ONCE DAILY IN THE MORNING 90 tablet 3  . Tetrahydrozoline HCl (VISINE OP) Apply 3 drops to eye daily as needed (dry eyes).    Marland Kitchen zolpidem (AMBIEN CR) 12.5 MG CR tablet Take 12.5 mg by mouth at bedtime.      No current facility-administered medications for this encounter.      PHYSICAL EXAM: Vitals:   07/16/17 1520  BP: (!) 110/58  [name]: 62  SpO2: 98%   Filed Weights   07/16/17 1520  Weight: 225 lb 4 oz (102.2 kg)   General:   General:  Well appearing. No resp difficulty HEENT: normal Neck: supple. no JVD. Carotids 2+ bilat; no bruits. No lymphadenopathy or thryomegaly appreciated. Cor: PMI nondisplaced. Regular rate & rhythm. No rubs, gallops or murmurs. Lungs: clear Abdomen: soft, nontender, nondistended. No hepatosplenomegaly. No bruits or masses. Good bowel sounds. Extremities: no cyanosis, clubbing, rash, edema Neuro: alert & orientedx3, cranial nerves grossly intact. moves all 4 extremities w/o difficulty. Affect pleasant   ASSESSMENT & PLAN: 1. Chronic systolic HF due to iCM EF 20% - CPX results very reassuring. Only mild to moderate HF limitation - Improved NYHA II symptoms - Volume status ok - Continue Entresto  - Continue current dose  of toprol xl and spiro 25 - Will schedule R/L cath this week to exclude ischemia as cause of VT. Hold Eliquis 24 hours - Check labs  2. CAD - Recent VT has raised concerned for increasing  ischemic substrate - Plan R/L cath on Friday - He refuses statin due to GI upset. Consider referral to lipid clinic for PCSK-9  3. PAF - Status post recent DC-CV. Subsequently in/out of AF and amio started - ICD interrogated personally today in clinic. No recurrent AF or VT - Continue amio and apixaban  4. VT - ICD interrogated as above. No recurrent VT or AF. Contonue amio   5. DM2 - Per PCP. Did not tolerate  Jardiance. I reviewed the recent literature with him and he will reconsider  6. HL - Refuses statin due to GI upset. . Consider PCSK9 inhibitor   Glori Bickers, MD 3:43 PM

## 2017-07-16 NOTE — H&P (View-Only) (Signed)
Patient ID: [name], male   DOB: February 12, 1956, 62 y.o.   MRN: [id number]   ADVANCED HF CLINIC NOTE  PCP: Dr [name] EP: Dr [name] Cardiologist: Dr [name]  HPI: [name] is a 62 year old male with history of chronic systolic heart failure EF 20% 2009, ICM s/p 2 myocardial infarctions and previous stents, [place] [employer] BiVICD in place, S/P device generator replacement in July 2011, DM,  obesity, HTN, and a low HDL. Disabled since 2011.   Myoview 7/11 which showed EF 17% with markedly dilated LV  EDV: 509 ml ESV: 422 ml. Extensive scar with trivial peri-infarct ischemia.  CPX  12/11 pVO2 24.5 slope 31.7 RER 1.12 O2 [name] 105%   In April 2011 received 7 ICD shocks for SVT and now has PTSD from the event.Tried increasing carvedilol in past, but did not tolerate due to diarrhea.   Echo 5/14: EF 20%. RV normal.   Has tried Entresto in past and stopped due to severe elbow pain. Recently Dr. Brigitte [name] started [place] and he was peeing a lot and relates it to him having AF. So he stopped.   Developed recurrent AF and underwent outpatient DC-CV on 11/28.Marland Kitchen He was feeling ok but developed tachycardia and he called EP and transmission showed VT. He presented to [facility] in [place] and was loaded on amiodarone which broke VT. Echo 06/09/17 EF 20-25%. Was in/out of AF prior to d/c. Was scheduled for cath to look for ischemic reason for VT but deferred to recent DC-CV  Returns for f/u. Had CPX last week with only mild HF limitation. Says he is feeling much better. Able to take garbage out to curb without SOB. No CP, edema, orthopnea or PND. No palpitations or ICD shocks.  Compliant with meds.   ICD interrogated in clinic. No reent VT/VF or AF. Activity level 2.5hr/day    CPX 07/13/16   FVC 4.82 (86%)    FEV1 3.40 (80%)     FEV1/FVC 71 (92%)     MVV 125 (78%)     Resting HR: 58 Peak HR: 113  (72% age predicted max HR) BP rest: 112/70 BP peak: 146/62 Peak VO2: 19.5 (64% predicted peak VO2) VE/VCO2 slope:  33 OUES: 2.24 Peak RER: 1.06  Ventilatory Threshold: 16.3 (53% predicted and 84% measured peak VO2) VE/MVV: 62% O2pulse: 18  (90% predicted O2pulse)  ROS: All systems negative except as listed in HPI, PMH and Problem List.  Past Medical History:  Diagnosis Date  . CAD (coronary artery disease) last 2005   multiple MIs  . CHF (congestive heart failure) (HCC)    secondary to severe ischemic cardiomyopathy.   a.    EF 20% with akinesis of the distal half to two-thirds of the ventricle.  Trivial MR   b.     Status post bi-V ICD.   c.     Cardiopulmonary exercise test (12/11) pVO2 of 24.5,VE/VCO2 slope is 31,.7  . Colon polyps   . Diabetes mellitus   . Diverticulosis   . GERD (gastroesophageal reflux disease)   . Headache(784.0)   . Hemorrhoids   . Hyperlipidemia   . 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 Insomnia   . Left bundle branch block   . Persistent atrial fibrillation ([place])   . Prostate neoplasm   . Tinnitus    [name] and goes    Current Outpatient Medications  Medication Sig Dispense Refill  . acetaminophen (TYLENOL) 500 MG tablet Take 1,000 mg  by mouth daily as needed for moderate pain.    Marland Kitchen amiodarone (PACERONE) 200 MG tablet Take 2 tablets (400 mg total) by mouth 2 (two) times daily. Start at 400mg  (2 tablets) twice daily for 2 weeks, then to 400mg  (2 tablets) once daily for 1 month, then reduce to 200mg  (1 tablet) by mouth once daily 146 tablet 0  . apixaban (ELIQUIS) 5 MG TABS tablet Take 1 tablet (5 mg total) 2 (two) times daily by mouth. 60 tablet 5  . DiphenhydrAMINE HCl (ZZZQUIL) 50 MG/30ML LIQD Take 35 mg by mouth at bedtime as needed (sleep).    Marland Kitchen esomeprazole (NEXIUM) 20 MG capsule Take 20 mg by mouth daily.     . furosemide (LASIX) 40 MG tablet Take 1 tablet (40 mg total) by mouth every morning. 90 tablet 3  . glimepiride (AMARYL) 4 MG tablet Take 4 mg by mouth every evening.     . loperamide (IMODIUM A-D) 2  MG tablet Take 4 mg by mouth daily as needed for diarrhea or loose stools.    . metoprolol succinate (TOPROL-XL) 100 MG 24 hr tablet TAKE ONE TABLET BY MOUTH ONCE DAILY. TAKE WITH FOOD OR IMMEDIATELY FOLLOWING A MEAL 90 tablet 0  . nitroGLYCERIN (NITROSTAT) 0.4 MG SL tablet Place 1 tablet (0.4 mg total) under the tongue every 5 (five) minutes as needed for chest pain. 25 tablet 6  . sacubitril-valsartan (ENTRESTO) 49-51 MG Take 1 tablet by mouth 2 (two) times daily. 60 tablet 3  . spironolactone (ALDACTONE) 25 MG tablet TAKE ONE TABLET BY MOUTH ONCE DAILY IN THE MORNING 90 tablet 3  . Tetrahydrozoline HCl (VISINE OP) Apply 3 drops to eye daily as needed (dry eyes).    Marland Kitchen zolpidem (AMBIEN CR) 12.5 MG CR tablet Take 12.5 mg by mouth at bedtime.      No current facility-administered medications for this encounter.      PHYSICAL EXAM: Vitals:   07/16/17 1520  BP: (!) 110/58  [name]: 62  SpO2: 98%   Filed Weights   07/16/17 1520  Weight: 225 lb 4 oz (102.2 kg)   General:   General:  Well appearing. No resp difficulty HEENT: normal Neck: supple. no JVD. Carotids 2+ bilat; no bruits. No lymphadenopathy or thryomegaly appreciated. Cor: PMI nondisplaced. Regular rate & rhythm. No rubs, gallops or murmurs. Lungs: clear Abdomen: soft, nontender, nondistended. No hepatosplenomegaly. No bruits or masses. Good bowel sounds. Extremities: no cyanosis, clubbing, rash, edema Neuro: alert & orientedx3, cranial nerves grossly intact. moves all 4 extremities w/o difficulty. Affect pleasant   ASSESSMENT & PLAN: 1. Chronic systolic HF due to iCM EF 20% - CPX results very reassuring. Only mild to moderate HF limitation - Improved NYHA II symptoms - Volume status ok - Continue Entresto  - Continue current dose  of toprol xl and spiro 25 - Will schedule R/L cath this week to exclude ischemia as cause of VT. Hold Eliquis 24 hours - Check labs  2. CAD - Recent VT has raised concerned for increasing  ischemic substrate - Plan R/L cath on Friday - He refuses statin due to GI upset. Consider referral to lipid clinic for PCSK-9  3. PAF - Status post recent DC-CV. Subsequently in/out of AF and amio started - ICD interrogated personally today in clinic. No recurrent AF or VT - Continue amio and apixaban  4. VT - ICD interrogated as above. No recurrent VT or AF. Contonue amio   5. DM2 - Per PCP. Did not tolerate  Jardiance. I reviewed the recent literature with him and he will reconsider  6. HL - Refuses statin due to GI upset. . Consider PCSK9 inhibitor   Glori Bickers, MD 3:43 PM

## 2017-07-18 ENCOUNTER — Telehealth (HOSPITAL_COMMUNITY): Payer: Self-pay | Admitting: *Deleted

## 2017-07-18 ENCOUNTER — Ambulatory Visit: Payer: Medicare Other | Admitting: Internal Medicine

## 2017-07-18 ENCOUNTER — Other Ambulatory Visit (HOSPITAL_COMMUNITY): Payer: Self-pay | Admitting: Cardiology

## 2017-07-18 ENCOUNTER — Encounter: Payer: Self-pay | Admitting: Internal Medicine

## 2017-07-18 VITALS — BP 96/58 | HR 49 | Ht 76.0 in | Wt 228.8 lb

## 2017-07-18 DIAGNOSIS — I5022 Chronic systolic (congestive) heart failure: Secondary | ICD-10-CM

## 2017-07-18 DIAGNOSIS — I255 Ischemic cardiomyopathy: Secondary | ICD-10-CM | POA: Diagnosis not present

## 2017-07-18 LAB — CUP PACEART INCLINIC DEVICE CHECK
Date Time Interrogation Session: 20190109050000
HighPow Impedance: 47 Ohm
HighPow Impedance: 66 Ohm
Implantable Lead Implant Date: 20051207
Implantable Lead Implant Date: 20110721
Implantable Lead Location: 753858
Implantable Lead Model: 4543
Implantable Lead Model: 5076
Lead Channel Impedance Value: 511 Ohm
Lead Channel Impedance Value: 833 Ohm
Lead Channel Pacing Threshold Amplitude: 0.9 V
Lead Channel Pacing Threshold Amplitude: 1 V
Lead Channel Pacing Threshold Pulse Width: 0.4 ms
Lead Channel Pacing Threshold Pulse Width: 0.4 ms
Lead Channel Sensing Intrinsic Amplitude: 24.9 mV
Lead Channel Setting Pacing Amplitude: 2 V
Lead Channel Setting Pacing Amplitude: 2.4 V
Lead Channel Setting Sensing Sensitivity: 0.5 mV
Lead Channel Setting Sensing Sensitivity: 1 mV
MDC IDC LEAD IMPLANT DT: 20051207
MDC IDC LEAD LOCATION: 753859
MDC IDC LEAD LOCATION: 753860
MDC IDC LEAD SERIAL: 148938
MDC IDC LEAD SERIAL: 205859
MDC IDC MSMT LEADCHNL RA PACING THRESHOLD AMPLITUDE: 0.6 V
MDC IDC MSMT LEADCHNL RA PACING THRESHOLD PULSEWIDTH: 0.4 ms
MDC IDC MSMT LEADCHNL RA SENSING INTR AMPL: 4.5 mV
MDC IDC MSMT LEADCHNL RV IMPEDANCE VALUE: 462 Ohm
MDC IDC MSMT LEADCHNL RV SENSING INTR AMPL: 25 mV — AB
MDC IDC PG IMPLANT DT: 20110721
MDC IDC SET LEADCHNL LV PACING AMPLITUDE: 1.9 V
MDC IDC SET LEADCHNL LV PACING PULSEWIDTH: 0.4 ms
MDC IDC SET LEADCHNL RV PACING PULSEWIDTH: 0.4 ms
Pulse Gen Serial Number: 139171

## 2017-07-18 MED ORDER — FUROSEMIDE 40 MG PO TABS: 40.0000 mg | ORAL_TABLET | Freq: Every morning | ORAL | 3 refills | Status: DC

## 2017-07-18 MED ORDER — SPIRONOLACTONE 25 MG PO TABS: ORAL_TABLET | ORAL | 3 refills | Status: DC

## 2017-07-18 NOTE — Telephone Encounter (Signed)
[name] #[id number]  CPT [id number] Valid dates- 07/17/17-08/31/17

## 2017-07-18 NOTE — Patient Instructions (Signed)
Medication Instructions:  Your physician recommends that you continue on your current medications as directed. Please refer to the Current Medication list given to you today.  *If you need a refill on your cardiac medications before your next appointment, please call your pharmacy*  Labwork: None ordered  Testing/Procedures: None ordered  Follow-Up: Remote monitoring is used to monitor your Pacemaker or ICD from home. This monitoring reduces the number of office visits required to check your device to one time per year. It allows [place] to keep an eye on the functioning of your device to ensure it is working properly. You are scheduled for a device check from home on 09/04/2017. You may send your transmission at any time that day. If you have a wireless device, the transmission will be sent automatically. After your physician reviews your transmission, you will receive a postcard with your next transmission date.  Your physician wants you to follow-up in: 6 months with Dr. Caryl Comes.  You will receive a reminder letter in the mail two months in advance. If you don't receive a letter, please call our office to schedule the follow-up appointment.  Thank you for choosing [facility]!!

## 2017-07-18 NOTE — Progress Notes (Signed)
Patient Care Team: Marton Redwood, MD as PCP - General (Internal Medicine)   HPI  [name] is a 62 y.o. male Seen in followup for congestive heart failure in the setting ischemic cardiomyopathy. with history of congestive heart failure secondary to ischemic cardiomyopathy with an EF of 20%. He is status post CRT-D implantation and underwent device generator replacement July 2011.  He is status post multiple myocardial infarctions and previous stents. He has a biventricular ICD in place. He under derwent device generator replacement in July 2011.  Chronically  shortness of breath no edema or chest pain   Myoview 2011 which showed EF 17% with markedly dilated LV EDV: 509 ml ESV: 422 ml. Extensive scar with trivial peri-infarct ischemia. CPX 12/11 pVO2 24.5 slope 31.7 RER 1.12 O2 pulse 105% echocardiogram 5/14 demonstrated LVEF 20% with mild LAE RAE   He was seen by the heart failure clinic 9/17. No medication changes or testing done.    Had recurrent A. fib and underwent DC cardioversion 11/28.  Subsequently developed tachycardia was found to be in VT and treated with amiodarone. No recurrent ventricular tachycardia since discharge  He is been feeling pretty well.  He has mild shortness of breath but is not severe.  He is having no chest pain or peripheral edema.  He is tolerating the amiodarone without difficulty.  Right and left heart catheterization is scheduled for Friday      Past Medical History:  Diagnosis Date  . CAD (coronary artery disease) last 2005   multiple MIs  . CHF (congestive heart failure) (HCC)    secondary to severe ischemic cardiomyopathy.   a.    EF 20% with akinesis of the distal half to two-thirds of the ventricle.  Trivial MR   b.     Status post bi-V ICD.   c.     Cardiopulmonary exercise test (12/11) pVO2 of 24.5,VE/VCO2 slope is 31,.7  . Colon polyps   . Diabetes mellitus   . Diverticulosis   . GERD (gastroesophageal reflux disease)   .  Headache(784.0)   . Hemorrhoids   . Hyperlipidemia   . 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 Insomnia   . Left bundle branch block   . Persistent atrial fibrillation ([place])   . Prostate neoplasm   . Tinnitus    comes and goes    Past Surgical History:  Procedure Laterality Date  . CARDIAC CATHETERIZATION  2005   stent x 1 placed  . CARDIOVERSION N/A 06/06/2017   Procedure: CARDIOVERSION;  Surgeon: Larey Dresser, MD;  Location: [facility] ENDOSCOPY;  Service: Cardiovascular;  Laterality: N/A;  . colonscopy     x 2  . dual chamber defibrillator  06/15/2004  . HEMORRHOID BANDING  09-2012  . single-chamber ICD implantation  08/10/2003  . TONSILLECTOMY  age 60    Current Outpatient Medications  Medication Sig Dispense Refill  . acetaminophen (TYLENOL) 500 MG tablet Take 1,000 mg by mouth daily as needed for moderate pain.    Marland Kitchen amiodarone (PACERONE) 200 MG tablet Take 2 tablets (400 mg total) by mouth 2 (two) times daily. Start at 400mg  (2 tablets) twice daily for 2 weeks, then to 400mg  (2 tablets) once daily for 1 month, then reduce to 200mg  (1 tablet) by mouth once daily 146 tablet 0  . apixaban (ELIQUIS) 5 MG TABS tablet Take 1 tablet (5 mg total) 2 (two) times daily by mouth. 60 tablet 5  .  DiphenhydrAMINE HCl (ZZZQUIL) 50 MG/30ML LIQD Take 35 mg by mouth at bedtime as needed (sleep).    Marland Kitchen esomeprazole (NEXIUM) 20 MG capsule Take 20 mg by mouth daily.     . furosemide (LASIX) 40 MG tablet Take 1 tablet (40 mg total) by mouth every morning. 90 tablet 3  . glimepiride (AMARYL) 4 MG tablet Take 4 mg by mouth every evening.     . loperamide (IMODIUM A-D) 2 MG tablet Take 4 mg by mouth daily as needed for diarrhea or loose stools.    . metoprolol succinate (TOPROL-XL) 100 MG 24 hr tablet TAKE ONE TABLET BY MOUTH ONCE DAILY. TAKE WITH FOOD OR IMMEDIATELY FOLLOWING A MEAL 90 tablet 0  . nitroGLYCERIN (NITROSTAT) 0.4 MG SL tablet Place 1 tablet (0.4  mg total) under the tongue every 5 (five) minutes as needed for chest pain. 25 tablet 6  . sacubitril-valsartan (ENTRESTO) 49-51 MG Take 1 tablet by mouth 2 (two) times daily. 60 tablet 3  . spironolactone (ALDACTONE) 25 MG tablet TAKE ONE TABLET BY MOUTH ONCE DAILY IN THE MORNING 90 tablet 3  . Tetrahydrozoline HCl (VISINE OP) Apply 3 drops to eye daily as needed (dry eyes).    Marland Kitchen zolpidem (AMBIEN CR) 12.5 MG CR tablet Take 12.5 mg by mouth at bedtime.      No current facility-administered medications for this visit.   Reviewed  Allergies  Allergen Reactions  . Coreg [Carvedilol] Diarrhea  . Entresto [Sacubitril-Valsartan] Other (See Comments)    Joint pain  . Jardiance [Empagliflozin] Other (See Comments)    Possible afib, definitely cost  . Lipitor [Atorvastatin] Diarrhea    Review of Systems negative except from HPI and PMH  Physical Exam BP (!) 96/58   Pulse (!) 49   Ht 6\' 4"  (1.93 m)   Wt 228 lb 12.8 oz (103.8 kg)   BMI 27.85 kg/m  Well developed and nourished in no acute distress HENT normal Neck supple with JVP-flat Clear Regular rate and rhythm, no murmurs or gallops Abd-soft with active BS No Clubbing cyanosis edema Skin-warm and dry A & Oriented  Grossly normal sensory and motor function    ECG demonstrates sinus rhythm at 49 Interval 16/19/55 QRS upright lead V1 and negative lead I   Assessment and  Plan  Ischemic cardiomyopathy  High Risk Medication Surveillance  Congestive heart failure-chronic-systolic     Implantable defibrillator-CRT  The patient's device was interrogated.  The information was reviewed. No changes were made in the programming.    Ventricular tachycardia  Atrial fibrillation    No interval atrial fibrillation.  Continue on amiodarone and apixaban.  Will be held for his catheterization  No interval ventricular tachycardia.  Amiodarone is being weaned.  We will continue with 200 mg daily.  Euvolemic continue current  meds  Without symptoms of ischemia

## 2017-07-20 ENCOUNTER — Encounter (HOSPITAL_COMMUNITY): Payer: Self-pay | Admitting: *Deleted

## 2017-07-20 ENCOUNTER — Ambulatory Visit (HOSPITAL_COMMUNITY)
Admission: RE | Admit: 2017-07-20 | Discharge: 2017-07-20 | Disposition: A | Discharge status: Home / Self Care | Payer: Medicare Other | Source: Ambulatory Visit | Attending: Internal Medicine | Admitting: Internal Medicine

## 2017-07-20 ENCOUNTER — Encounter (HOSPITAL_COMMUNITY)
Admission: RE | Disposition: A | Discharge status: Home / Self Care | Payer: Self-pay | Source: Ambulatory Visit | Attending: Internal Medicine

## 2017-07-20 DIAGNOSIS — E119 Type 2 diabetes mellitus without complications: Secondary | ICD-10-CM | POA: Diagnosis not present

## 2017-07-20 DIAGNOSIS — I472 Ventricular tachycardia: Secondary | ICD-10-CM | POA: Diagnosis not present

## 2017-07-20 DIAGNOSIS — I481 Persistent atrial fibrillation: Secondary | ICD-10-CM | POA: Diagnosis not present

## 2017-07-20 DIAGNOSIS — F4310 Post-traumatic stress disorder, unspecified: Secondary | ICD-10-CM | POA: Diagnosis not present

## 2017-07-20 DIAGNOSIS — K219 Gastro-esophageal reflux disease without esophagitis: Secondary | ICD-10-CM | POA: Insufficient documentation

## 2017-07-20 DIAGNOSIS — I255 Ischemic cardiomyopathy: Secondary | ICD-10-CM | POA: Diagnosis not present

## 2017-07-20 DIAGNOSIS — Z79899 Other long term (current) drug therapy: Secondary | ICD-10-CM | POA: Insufficient documentation

## 2017-07-20 DIAGNOSIS — Z7984 Long term (current) use of oral hypoglycemic drugs: Secondary | ICD-10-CM | POA: Diagnosis not present

## 2017-07-20 DIAGNOSIS — I5022 Chronic systolic (congestive) heart failure: Secondary | ICD-10-CM | POA: Insufficient documentation

## 2017-07-20 DIAGNOSIS — I110 Hypertensive heart disease with heart failure: Secondary | ICD-10-CM | POA: Diagnosis not present

## 2017-07-20 DIAGNOSIS — G4700 Insomnia, unspecified: Secondary | ICD-10-CM | POA: Diagnosis not present

## 2017-07-20 DIAGNOSIS — E669 Obesity, unspecified: Secondary | ICD-10-CM | POA: Insufficient documentation

## 2017-07-20 DIAGNOSIS — I252 Old myocardial infarction: Secondary | ICD-10-CM | POA: Insufficient documentation

## 2017-07-20 DIAGNOSIS — I2510 Atherosclerotic heart disease of native coronary artery without angina pectoris: Secondary | ICD-10-CM | POA: Insufficient documentation

## 2017-07-20 DIAGNOSIS — Z95810 Presence of automatic (implantable) cardiac defibrillator: Secondary | ICD-10-CM | POA: Diagnosis not present

## 2017-07-20 DIAGNOSIS — Z955 Presence of coronary angioplasty implant and graft: Secondary | ICD-10-CM | POA: Insufficient documentation

## 2017-07-20 DIAGNOSIS — I2582 Chronic total occlusion of coronary artery: Secondary | ICD-10-CM | POA: Insufficient documentation

## 2017-07-20 DIAGNOSIS — Z7901 Long term (current) use of anticoagulants: Secondary | ICD-10-CM | POA: Insufficient documentation

## 2017-07-20 DIAGNOSIS — Z6827 Body mass index (BMI) 27.0-27.9, adult: Secondary | ICD-10-CM | POA: Insufficient documentation

## 2017-07-20 HISTORY — PX: RIGHT/LEFT HEART CATH AND CORONARY ANGIOGRAPHY: CATH118266

## 2017-07-20 LAB — GLUCOSE, CAPILLARY
Glucose-Capillary: 117 mg/dL — ABNORMAL HIGH (ref 65–99)
Glucose-Capillary: 98 mg/dL (ref 65–99)

## 2017-07-20 LAB — POCT I-STAT 3, ART BLOOD GAS (G3+)
Acid-base deficit: 1 mmol/L (ref 0.0–2.0)
Bicarbonate: 24.1 mmol/L (ref 20.0–28.0)
O2 SAT: 95 %
PH ART: 7.398 (ref 7.350–7.450)
TCO2: 25 mmol/L (ref 22–32)
pCO2 arterial: 39.1 mmHg (ref 32.0–48.0)
pO2, Arterial: 76 mmHg — ABNORMAL LOW (ref 83.0–108.0)

## 2017-07-20 LAB — POCT I-STAT 3, VENOUS BLOOD GAS (G3P V)
ACID-BASE EXCESS: 1 mmol/L (ref 0.0–2.0)
Bicarbonate: 26.8 mmol/L (ref 20.0–28.0)
Bicarbonate: 25.6 mmol/L (ref 20.0–28.0)
O2 Saturation: 66 %
O2 Saturation: 67 %
PCO2 VEN: 44.5 mmHg (ref 44.0–60.0)
PH VEN: 7.368 (ref 7.250–7.430)
PO2 VEN: 35 mmHg (ref 32.0–45.0)
TCO2: 28 mmol/L (ref 22–32)
TCO2: 27 mmol/L (ref 22–32)
pCO2, Ven: 45.9 mmHg (ref 44.0–60.0)
pH, Ven: 7.374 (ref 7.250–7.430)
pO2, Ven: 36 mmHg (ref 32.0–45.0)

## 2017-07-20 SURGERY — RIGHT/LEFT HEART CATH AND CORONARY ANGIOGRAPHY
Anesthesia: LOCAL

## 2017-07-20 MED ORDER — FENTANYL CITRATE (PF) 100 MCG/2ML IJ SOLN
INTRAMUSCULAR | Status: DC | PRN
  Administered 2017-07-20: 25 ug via INTRAVENOUS

## 2017-07-20 MED ORDER — LIDOCAINE HCL (PF) 1 % IJ SOLN
INTRAMUSCULAR | Status: AC
  Filled 2017-07-20: qty 30

## 2017-07-20 MED ORDER — HEPARIN SODIUM (PORCINE) 1000 UNIT/ML IJ SOLN
INTRAMUSCULAR | Status: DC | PRN
  Administered 2017-07-20: 5000 [IU] via INTRAVENOUS

## 2017-07-20 MED ORDER — SODIUM CHLORIDE 0.9 % IV SOLN: INTRAVENOUS | Status: AC

## 2017-07-20 MED ORDER — SODIUM CHLORIDE 0.9 % IV SOLN
INTRAVENOUS | Status: DC
  Administered 2017-07-20: 10:00:00 via INTRAVENOUS

## 2017-07-20 MED ORDER — ASPIRIN 81 MG PO CHEW
81.0000 mg | CHEWABLE_TABLET | ORAL | Status: AC
  Administered 2017-07-20: 81 mg via ORAL

## 2017-07-20 MED ORDER — LIDOCAINE HCL (PF) 1 % IJ SOLN
INTRAMUSCULAR | Status: DC | PRN
  Administered 2017-07-20 (×2): 2 mL

## 2017-07-20 MED ORDER — SODIUM CHLORIDE 0.9 % IV SOLN: 250.0000 mL | INTRAVENOUS | Status: DC | PRN

## 2017-07-20 MED ORDER — IOPAMIDOL (ISOVUE-370) INJECTION 76%
INTRAVENOUS | Status: DC | PRN
  Administered 2017-07-20: 55 mL

## 2017-07-20 MED ORDER — MIDAZOLAM HCL 2 MG/2ML IJ SOLN
INTRAMUSCULAR | Status: AC
  Filled 2017-07-20: qty 2

## 2017-07-20 MED ORDER — VERAPAMIL HCL 2.5 MG/ML IV SOLN
INTRAVENOUS | Status: AC
  Filled 2017-07-20: qty 2

## 2017-07-20 MED ORDER — SODIUM CHLORIDE 0.9% FLUSH: 3.0000 mL | INTRAVENOUS | Status: DC | PRN

## 2017-07-20 MED ORDER — FENTANYL CITRATE (PF) 100 MCG/2ML IJ SOLN
INTRAMUSCULAR | Status: AC
  Filled 2017-07-20: qty 2

## 2017-07-20 MED ORDER — HEPARIN SODIUM (PORCINE) 1000 UNIT/ML IJ SOLN
INTRAMUSCULAR | Status: AC
  Filled 2017-07-20: qty 1

## 2017-07-20 MED ORDER — HEPARIN (PORCINE) IN NACL 2-0.9 UNIT/ML-% IJ SOLN
INTRAMUSCULAR | Status: DC | PRN
  Administered 2017-07-20: 12:00:00

## 2017-07-20 MED ORDER — SODIUM CHLORIDE 0.9% FLUSH: 3.0000 mL | Freq: Two times a day (BID) | INTRAVENOUS | Status: DC

## 2017-07-20 MED ORDER — VERAPAMIL HCL 2.5 MG/ML IV SOLN
INTRAVENOUS | Status: DC | PRN
  Administered 2017-07-20: 10 mL via INTRA_ARTERIAL

## 2017-07-20 MED ORDER — ASPIRIN 81 MG PO CHEW
CHEWABLE_TABLET | ORAL | Status: AC
  Administered 2017-07-20: 81 mg via ORAL
  Filled 2017-07-20: qty 1

## 2017-07-20 MED ORDER — IOPAMIDOL (ISOVUE-370) INJECTION 76%
INTRAVENOUS | Status: AC
  Filled 2017-07-20: qty 100

## 2017-07-20 MED ORDER — HEPARIN (PORCINE) IN NACL 2-0.9 UNIT/ML-% IJ SOLN
INTRAMUSCULAR | Status: AC
  Filled 2017-07-20: qty 1000

## 2017-07-20 MED ORDER — MIDAZOLAM HCL 2 MG/2ML IJ SOLN
INTRAMUSCULAR | Status: DC | PRN
  Administered 2017-07-20: 2 mg via INTRAVENOUS

## 2017-07-20 SURGICAL SUPPLY — 11 items

## 2017-07-20 NOTE — Interval H&P Note (Signed)
History and Physical Interval Note:  07/20/2017 12:04 PM  [name]  has presented today for surgery, with the diagnosis of hf  The various methods of treatment have been discussed with the patient and family. After consideration of risks, benefits and other options for treatment, the patient has consented to  Procedure(s): RIGHT/LEFT HEART CATH AND CORONARY ANGIOGRAPHY (N/A) and possible coronary angioplasty as a surgical intervention .  The patient's history has been reviewed, patient examined, no change in status, stable for surgery.  I have reviewed the patient's chart and labs.  Questions were answered to the patient's satisfaction.     Daniel Bensimhon

## 2017-07-20 NOTE — Progress Notes (Signed)
Site area: right groin  Site Prior to Removal:  Level 0  Pressure Applied For 15 MINUTES    Minutes Beginning at 1300  Manual:   Yes.    Patient Status During Pull:  stable  Post Brachial   Site:  Level 0  Post Pull Instructions Given:  Yes.    Post Pull Pulses Present:  Yes.    Dressing Applied:  Yes.   2x2 and tegederm dsg applied Comments:  Pt tolerating well

## 2017-07-20 NOTE — Discharge Instructions (Signed)

## 2017-07-21 ENCOUNTER — Encounter (HOSPITAL_COMMUNITY): Payer: Self-pay | Admitting: Internal Medicine

## 2017-08-15 ENCOUNTER — Other Ambulatory Visit: Payer: Self-pay | Admitting: Physician Assistant

## 2017-08-17 ENCOUNTER — Telehealth: Payer: Self-pay | Admitting: *Deleted

## 2017-08-17 NOTE — Telephone Encounter (Signed)
[name] calling to clarify amiodarone dose. His refill indicated that he should start the "load" over again. I advised that he take 200mg  by mouth daily. I will route this to Dollene Primrose, RN for clarification/ refills. He verbalizes understanding.

## 2017-08-17 NOTE — Telephone Encounter (Signed)
Called pt back after speaking with Dr Caryl Comes. The ordered prescription should be Amiodarone 200mg  per day. Pt verbalized understanding and restated "200mg , one tablet, per day).

## 2017-09-03 ENCOUNTER — Other Ambulatory Visit: Payer: Self-pay | Admitting: Internal Medicine

## 2017-09-04 ENCOUNTER — Ambulatory Visit (INDEPENDENT_AMBULATORY_CARE_PROVIDER_SITE_OTHER): Payer: Medicare Other | Admitting: *Deleted

## 2017-09-04 DIAGNOSIS — I255 Ischemic cardiomyopathy: Secondary | ICD-10-CM | POA: Diagnosis not present

## 2017-09-04 NOTE — Progress Notes (Signed)
Remote ICD transmission.   

## 2017-09-06 ENCOUNTER — Encounter: Payer: Self-pay | Admitting: Cardiology

## 2017-09-21 LAB — CUP PACEART REMOTE DEVICE CHECK
Battery Remaining Percentage: 72 %
Brady Statistic RA Percent Paced: 87 %
Date Time Interrogation Session: 20190226085100
HighPow Impedance: 67 Ohm
Implantable Lead Implant Date: 20051207
Implantable Lead Location: 753858
Implantable Lead Location: 753860
Implantable Lead Serial Number: 148938
Implantable Pulse Generator Implant Date: 20110721
Lead Channel Impedance Value: 474 Ohm
Lead Channel Impedance Value: 532 Ohm
Lead Channel Pacing Threshold Amplitude: 0.9 V
Lead Channel Pacing Threshold Pulse Width: 0.4 ms
Lead Channel Pacing Threshold Pulse Width: 0.4 ms
Lead Channel Setting Pacing Amplitude: 1.9 V
Lead Channel Setting Pacing Amplitude: 2 V
Lead Channel Setting Pacing Amplitude: 2.4 V
Lead Channel Setting Pacing Pulse Width: 0.4 ms
Lead Channel Setting Pacing Pulse Width: 0.4 ms
Lead Channel Setting Sensing Sensitivity: 1 mV
MDC IDC LEAD IMPLANT DT: 20051207
MDC IDC LEAD IMPLANT DT: 20110721
MDC IDC LEAD LOCATION: 753859
MDC IDC LEAD SERIAL: 205859
MDC IDC MSMT BATTERY REMAINING LONGEVITY: 48 mo
MDC IDC MSMT LEADCHNL LV IMPEDANCE VALUE: 858 Ohm
MDC IDC MSMT LEADCHNL LV PACING THRESHOLD AMPLITUDE: 1 V
MDC IDC MSMT LEADCHNL RA PACING THRESHOLD AMPLITUDE: 0.6 V
MDC IDC MSMT LEADCHNL RV PACING THRESHOLD PULSEWIDTH: 0.4 ms
MDC IDC SET LEADCHNL RV SENSING SENSITIVITY: 0.5 mV
MDC IDC STAT BRADY RV PERCENT PACED: 100 %
Pulse Gen Serial Number: 139171

## 2017-11-01 ENCOUNTER — Telehealth (HOSPITAL_COMMUNITY): Payer: Self-pay | Admitting: Vascular Surgery

## 2017-11-01 NOTE — Telephone Encounter (Signed)
Left pt message to make f/u appt w/ DB next ava will be fine

## 2017-11-05 ENCOUNTER — Other Ambulatory Visit (HOSPITAL_COMMUNITY): Payer: Self-pay | Admitting: Internal Medicine

## 2017-11-13 ENCOUNTER — Other Ambulatory Visit: Payer: Self-pay | Admitting: Nurse Practitioner

## 2017-11-13 ENCOUNTER — Other Ambulatory Visit: Payer: Self-pay | Admitting: Internal Medicine

## 2017-11-14 NOTE — Telephone Encounter (Signed)
Age 62 years Wt 103.8kg  07/18/2017 Saw Dr Caryl Comes on 07/18/2017 07/16/2017 SrCr 1.12 07/16/2017 Hgb 15.6 HCT 49.2 Refill done for Eliquis 5mg  q 12 hours as requested

## 2017-12-05 ENCOUNTER — Ambulatory Visit (INDEPENDENT_AMBULATORY_CARE_PROVIDER_SITE_OTHER): Payer: Medicare Other | Admitting: *Deleted

## 2017-12-05 DIAGNOSIS — I255 Ischemic cardiomyopathy: Secondary | ICD-10-CM | POA: Diagnosis not present

## 2017-12-06 NOTE — Progress Notes (Signed)
Remote ICD transmission.   

## 2017-12-10 LAB — CUP PACEART REMOTE DEVICE CHECK
Battery Remaining Longevity: 42 mo
Battery Remaining Percentage: 67 %
Brady Statistic RA Percent Paced: 85 %
Brady Statistic RV Percent Paced: 100 %
Date Time Interrogation Session: 20190528075100
HIGH POWER IMPEDANCE MEASURED VALUE: 61 Ohm
Implantable Lead Implant Date: 20051207
Implantable Lead Location: 753859
Implantable Lead Model: 158
Implantable Lead Model: 4543
Implantable Lead Serial Number: 148938
Lead Channel Impedance Value: 437 Ohm
Lead Channel Impedance Value: 497 Ohm
Lead Channel Impedance Value: 804 Ohm
Lead Channel Pacing Threshold Amplitude: 0.6 V
Lead Channel Pacing Threshold Amplitude: 1 V
Lead Channel Pacing Threshold Pulse Width: 0.4 ms
Lead Channel Pacing Threshold Pulse Width: 0.4 ms
Lead Channel Setting Pacing Pulse Width: 0.4 ms
Lead Channel Setting Sensing Sensitivity: 1 mV
MDC IDC LEAD IMPLANT DT: 20051207
MDC IDC LEAD IMPLANT DT: 20110721
MDC IDC LEAD LOCATION: 753858
MDC IDC LEAD LOCATION: 753860
MDC IDC LEAD SERIAL: 205859
MDC IDC MSMT LEADCHNL RV PACING THRESHOLD AMPLITUDE: 0.9 V
MDC IDC MSMT LEADCHNL RV PACING THRESHOLD PULSEWIDTH: 0.4 ms
MDC IDC PG IMPLANT DT: 20110721
MDC IDC PG SERIAL: 139171
MDC IDC SET LEADCHNL LV PACING AMPLITUDE: 1.9 V
MDC IDC SET LEADCHNL LV PACING PULSEWIDTH: 0.4 ms
MDC IDC SET LEADCHNL RA PACING AMPLITUDE: 2 V
MDC IDC SET LEADCHNL RV PACING AMPLITUDE: 2.4 V
MDC IDC SET LEADCHNL RV SENSING SENSITIVITY: 0.5 mV

## 2017-12-20 ENCOUNTER — Telehealth: Payer: Self-pay

## 2017-12-20 ENCOUNTER — Telehealth: Payer: Self-pay | Admitting: Cardiology

## 2017-12-20 NOTE — Telephone Encounter (Signed)
Spoke with pt regarding his tooth extraction scheduled for Monday June 17. Per Dr Caryl Comes, pt is to hold Eliquis 36hrs prior to procedure and resume at two days after procedure. Pt should discuss resuming Eliquis with dentist as well. Pt agrees with plan and verbalized understanding.

## 2017-12-20 NOTE — Telephone Encounter (Signed)
Patient is going to have dental surgery and the dental office needs instructions on patient because of his medication he is taking. I told them that I would write the nurse a note and have her call them back. The dental office number is [phone]. They will be there until 4 pm today and will not be back until Monday and that is when the patient is schedule for surgery

## 2017-12-20 NOTE — Telephone Encounter (Signed)
Patient called and stated that he is having a dental procedure on Monday 12-24-2017. Pt stated that Dr. Jannifer Franklin needs to know if patient needs to hold Eliquis prior to the procedure. Pt may be having 3-4 teeth extracted. You can contact Dr. Moss Mc office at [phone]. Fax number [phone]. Informed pt that  I would send a message to MD nurse and someone will call him back.

## 2017-12-20 NOTE — Telephone Encounter (Signed)
Pt will have his dental office fax over a surgical clearance form for further review.

## 2017-12-24 ENCOUNTER — Telehealth: Payer: Self-pay

## 2017-12-24 NOTE — Telephone Encounter (Signed)
I spoke with the Dentist office about pt procedure. They wanted me to give them a copy of the instructions about the pt medication instructions. I printed off the phone note and fax it to them.

## 2018-01-07 ENCOUNTER — Ambulatory Visit (HOSPITAL_COMMUNITY)
Admission: RE | Admit: 2018-01-07 | Discharge: 2018-01-07 | Disposition: A | Discharge status: Home / Self Care | Payer: Medicare Other | Source: Ambulatory Visit | Attending: Internal Medicine | Admitting: Internal Medicine

## 2018-01-07 VITALS — BP 124/62 | HR 70 | Wt 239.0 lb

## 2018-01-07 DIAGNOSIS — E119 Type 2 diabetes mellitus without complications: Secondary | ICD-10-CM | POA: Diagnosis not present

## 2018-01-07 DIAGNOSIS — I2510 Atherosclerotic heart disease of native coronary artery without angina pectoris: Secondary | ICD-10-CM

## 2018-01-07 DIAGNOSIS — I472 Ventricular tachycardia, unspecified: Secondary | ICD-10-CM

## 2018-01-07 DIAGNOSIS — I481 Persistent atrial fibrillation: Secondary | ICD-10-CM | POA: Diagnosis not present

## 2018-01-07 DIAGNOSIS — Z7984 Long term (current) use of oral hypoglycemic drugs: Secondary | ICD-10-CM | POA: Diagnosis not present

## 2018-01-07 DIAGNOSIS — Z79899 Other long term (current) drug therapy: Secondary | ICD-10-CM | POA: Diagnosis not present

## 2018-01-07 DIAGNOSIS — I4891 Unspecified atrial fibrillation: Secondary | ICD-10-CM | POA: Diagnosis not present

## 2018-01-07 DIAGNOSIS — Z95810 Presence of automatic (implantable) cardiac defibrillator: Secondary | ICD-10-CM | POA: Diagnosis not present

## 2018-01-07 DIAGNOSIS — I5022 Chronic systolic (congestive) heart failure: Secondary | ICD-10-CM

## 2018-01-07 DIAGNOSIS — I110 Hypertensive heart disease with heart failure: Secondary | ICD-10-CM | POA: Insufficient documentation

## 2018-01-07 DIAGNOSIS — E785 Hyperlipidemia, unspecified: Secondary | ICD-10-CM | POA: Diagnosis not present

## 2018-01-07 DIAGNOSIS — I480 Paroxysmal atrial fibrillation: Secondary | ICD-10-CM | POA: Diagnosis not present

## 2018-01-07 DIAGNOSIS — K219 Gastro-esophageal reflux disease without esophagitis: Secondary | ICD-10-CM | POA: Insufficient documentation

## 2018-01-07 MED ORDER — SACUBITRIL-VALSARTAN 97-103 MG PO TABS: 1.0000 | ORAL_TABLET | Freq: Two times a day (BID) | ORAL | 6 refills | Status: DC

## 2018-01-07 NOTE — Patient Instructions (Signed)
Increase Entresto to 97/103 mg Twice daily   We will contact you in 6 months to schedule your next appointment.

## 2018-01-07 NOTE — Progress Notes (Signed)
Patient ID: [name], male   DOB: 1956-01-22, 62 y.o.   MRN: [id number]   ADVANCED HF CLINIC NOTE  PCP: Dr Brigitte Pulse EP: Dr Caryl Comes Cardiologist: Dr Haroldine Laws  HPI: [name] is a 62 year old male with history of chronic systolic heart failure EF 20% 2009, ICM s/p 2 myocardial infarctions and previous stents, [place] Sci BiVICD in place, S/P device generator replacement in July 2011, DM,  obesity, HTN, and a low HDL. Disabled since 2011.   Myoview 7/11 which showed EF 17% with markedly dilated LV  EDV: 509 ml ESV: 422 ml. Extensive scar with trivial peri-infarct ischemia.  CPX  12/11 pVO2 24.5 slope 31.7 RER 1.12 O2 pulse 105%   In April 2011 received 7 ICD shocks for SVT and now has PTSD from the event.Tried increasing carvedilol in past, but did not tolerate due to diarrhea.   Echo 5/14: EF 20%. RV normal.   Has tried Entresto in past and stopped due to severe elbow pain. Recently Dr. Brigitte Pulse started [place] and he was peeing a lot and relates it to him having AF. So he stopped.   Developed recurrent AF and underwent outpatient DC-CV on 06/06/17.Marland Kitchen He was feeling ok but developed tachycardia and he called EP and transmission showed VT. He presented to [facility] in [place] and was loaded on amiodarone which broke VT. Echo 06/09/17 EF 20-25%. Was in/out of AF prior to d/c.  Returns for f/u. Since we last saw him we did R/L cath in 1/19. Stable CAD. Hemodynamics looked good. Says weight up from 220-> 240. But says he is not eating a lot. Drinks a moderate amount of fluid. Remains SOB with mild to moderate exertion. No edema. No ICD firing. Unsure if he has had AF. Taking meds as directed.   ICD interrogated in clinic. 3 NSVT events since March No AF or sustained VT. Personally reviewed   Cath 1/19 Ao = 97/58 (73) LV = 97/14 RA = 1 RV = 33/3 PA = 35/6 (21) PCW = 6 Fick cardiac output/index = 5.2/2.2 PVR =2.9 WU FA sat = 95% PA sat = 66%, 67%  Assessment: 1. 3v CAD with patent RCA stent and CTO  LAD 2. ICM EF 15-20% 3. Well-compensated hemodynamics  CPX 07/13/16   FVC 4.82 (86%)    FEV1 3.40 (80%)     FEV1/FVC 71 (92%)     MVV 125 (78%)     Resting HR: 58 Peak HR: 113  (72% age predicted max HR) BP rest: 112/70 BP peak: 146/62 Peak VO2: 19.5 (64% predicted peak VO2) VE/VCO2 slope: 33 OUES: 2.24 Peak RER: 1.06  Ventilatory Threshold: 16.3 (53% predicted and 84% measured peak VO2) VE/MVV: 62% O2pulse: 18  (90% predicted O2pulse)  ROS: All systems negative except as listed in HPI, PMH and Problem List.  Past Medical History:  Diagnosis Date  . CAD (coronary artery disease) last 2005   multiple MIs  . CHF (congestive heart failure) (HCC)    secondary to severe ischemic cardiomyopathy.   a.    EF 20% with akinesis of the distal half to two-thirds of the ventricle.  Trivial MR   b.     Status post bi-V ICD.   c.     Cardiopulmonary exercise test (12/11) pVO2 of 24.5,VE/VCO2 slope is 31,.7  . Colon polyps   . Diabetes mellitus   . Diverticulosis   . GERD (gastroesophageal reflux disease)   . Headache(784.0)   . Hemorrhoids   . Hyperlipidemia   . ICD (implantable  cardiac defibrillator) in place 10/2009   shocks due to sinus tac  . Insomnia   . Left bundle branch block   . Persistent atrial fibrillation ([place])   . Prostate neoplasm   . Tinnitus    comes and goes    Current Outpatient Medications  Medication Sig Dispense Refill  . acetaminophen (TYLENOL) 500 MG tablet Take 1,000 mg by mouth daily as needed for moderate pain.    Marland Kitchen amiodarone (PACERONE) 200 MG tablet Take 1 tablet (200 mg total) by mouth daily. 90 tablet 2  . DiphenhydrAMINE HCl (ZZZQUIL) 50 MG/30ML LIQD Take 35 mg by mouth at bedtime as needed (sleep).    Marland Kitchen ELIQUIS 5 MG TABS tablet TAKE 1 TABLET BY MOUTH TWICE DAILY 60 tablet 5  . ENTRESTO 49-51 MG TAKE 1 TABLET BY MOUTH TWICE DAILY 60 tablet 3  . esomeprazole (NEXIUM) 20 MG capsule Take 20 mg by mouth daily.     . furosemide (LASIX) 40  MG tablet Take 1 tablet (40 mg total) by mouth every morning. 90 tablet 3  . glimepiride (AMARYL) 4 MG tablet Take 4 mg by mouth every evening.     . loperamide (IMODIUM A-D) 2 MG tablet Take 4 mg by mouth daily as needed for diarrhea or loose stools.    . metoprolol succinate (TOPROL-XL) 100 MG 24 hr tablet TAKE 1 TABLET BY MOUTH ONCE DAILY WITH FOOD OR IMMEDIATELY FOLLOWING A MEAL 90 tablet 3  . nitroGLYCERIN (NITROSTAT) 0.4 MG SL tablet Place 1 tablet (0.4 mg total) under the tongue every 5 (five) minutes as needed for chest pain. 25 tablet 6  . spironolactone (ALDACTONE) 25 MG tablet TAKE ONE TABLET BY MOUTH ONCE DAILY IN THE MORNING 90 tablet 3  . Tetrahydrozoline HCl (VISINE OP) Apply 3 drops to eye daily as needed (dry eyes).    Marland Kitchen zolpidem (AMBIEN CR) 12.5 MG CR tablet Take 12.5 mg by mouth at bedtime.      No current facility-administered medications for this encounter.      PHYSICAL EXAM: Vitals:   01/07/18 1059  BP: 124/62  Pulse: 70  SpO2: 98%   Filed Weights   01/07/18 1059  Weight: 239 lb (108.4 kg)   General:   General:  Well appearing. No resp difficulty HEENT: normal Neck: supple. no JVD. Carotids 2+ bilat; no bruits. No lymphadenopathy or thryomegaly appreciated. Cor: PMI nondisplaced. Regular rate & rhythm. No rubs, gallops or murmurs. Lungs: clear Abdomen: soft, nontender, nondistended. No hepatosplenomegaly. No bruits or masses. Good bowel sounds. Extremities: no cyanosis, clubbing, rash, edema Neuro: alert & orientedx3, cranial nerves grossly intact. moves all 4 extremities w/o difficulty. Affect pleasant   ASSESSMENT & PLAN: 1. Chronic systolic HF due to iCM EF 20% - CPX and cath results very reassuring. Only mild to moderate HF limitation - Stable NYHA II-III symptoms - Volume status looks good despite weight gain - Increase Entresto to 97/103 - Continue current dose of toprol xl and spiro 25   2. CAD - Stable by cath 1/19 - He refuses statin due  to GI upset. Consider referral to lipid clinic for PCSK-9  3. PAF - Status post recent DC-CV 11/18 - ICD interrogated personally today in clinic. No recurrent AF or VT - Continue amio and apixaban - Follows with Dr. Caryl Comes  4. [place] - ICD interrogated as above. No sustained VT or AF. Continue amio. Follows with Dr. Caryl Comes  5. DM2 - Per PCP. Did not tolerate Jardiance. I reviewed the  recent literature with him and he will reconsider  6. HL - Refuses statin due to GI upset. . Consider PCSK9 inhibitor   Glori Bickers, MD 11:07 AM

## 2018-01-17 ENCOUNTER — Encounter: Payer: Self-pay | Admitting: Internal Medicine

## 2018-01-17 ENCOUNTER — Ambulatory Visit: Payer: Medicare Other | Admitting: Internal Medicine

## 2018-01-17 VITALS — BP 118/74 | HR 60 | Ht 76.0 in | Wt 240.4 lb

## 2018-01-17 DIAGNOSIS — I472 Ventricular tachycardia, unspecified: Secondary | ICD-10-CM

## 2018-01-17 DIAGNOSIS — I5022 Chronic systolic (congestive) heart failure: Secondary | ICD-10-CM

## 2018-01-17 DIAGNOSIS — I255 Ischemic cardiomyopathy: Secondary | ICD-10-CM

## 2018-01-17 DIAGNOSIS — I4891 Unspecified atrial fibrillation: Secondary | ICD-10-CM

## 2018-01-17 DIAGNOSIS — Z95810 Presence of automatic (implantable) cardiac defibrillator: Secondary | ICD-10-CM

## 2018-01-17 LAB — CUP PACEART INCLINIC DEVICE CHECK
Date Time Interrogation Session: 20190711040000
HighPow Impedance: 47 Ohm
HighPow Impedance: 70 Ohm
Implantable Lead Implant Date: 20110721
Implantable Lead Location: 753858
Implantable Lead Serial Number: 205859
Lead Channel Impedance Value: 543 Ohm
Lead Channel Pacing Threshold Amplitude: 1 V
Lead Channel Pacing Threshold Pulse Width: 0.4 ms
Lead Channel Pacing Threshold Pulse Width: 0.4 ms
Lead Channel Sensing Intrinsic Amplitude: 25 mV
Lead Channel Setting Pacing Amplitude: 2 V
Lead Channel Setting Pacing Pulse Width: 0.4 ms
MDC IDC LEAD IMPLANT DT: 20051207
MDC IDC LEAD IMPLANT DT: 20051207
MDC IDC LEAD LOCATION: 753859
MDC IDC LEAD LOCATION: 753860
MDC IDC LEAD SERIAL: 148938
MDC IDC MSMT LEADCHNL LV IMPEDANCE VALUE: 890 Ohm
MDC IDC MSMT LEADCHNL RA PACING THRESHOLD AMPLITUDE: 0.7 V
MDC IDC MSMT LEADCHNL RA PACING THRESHOLD PULSEWIDTH: 0.4 ms
MDC IDC MSMT LEADCHNL RA SENSING INTR AMPL: 5.1 mV
MDC IDC MSMT LEADCHNL RV IMPEDANCE VALUE: 492 Ohm
MDC IDC MSMT LEADCHNL RV PACING THRESHOLD AMPLITUDE: 0.9 V
MDC IDC MSMT LEADCHNL RV SENSING INTR AMPL: 25 mV — AB
MDC IDC PG IMPLANT DT: 20110721
MDC IDC SET LEADCHNL LV PACING AMPLITUDE: 1.9 V
MDC IDC SET LEADCHNL LV SENSING SENSITIVITY: 1 mV
MDC IDC SET LEADCHNL RV PACING AMPLITUDE: 2.4 V
MDC IDC SET LEADCHNL RV PACING PULSEWIDTH: 0.4 ms
MDC IDC SET LEADCHNL RV SENSING SENSITIVITY: 0.5 mV
Pulse Gen Serial Number: 139171

## 2018-01-17 NOTE — Progress Notes (Signed)
Patient Care Team: Marton Redwood, MD as PCP - General (Internal Medicine) Deboraha Sprang, MD as Consulting Physician (Cardiology)   HPI  [name] is a 62 y.o. male Seen in followup for congestive heart failure in the setting ischemic cardiomyopathy. with history of congestive heart failure secondary to ischemic cardiomyopathy with an EF of 20%. He is status post CRT-D implantation and underwent device generator replacement July 2011.  He is status post multiple myocardial infarctions and previous stents. He has a biventricular ICD in place. He under derwent device generator replacement in July 2011.  Chronically  shortness of breath no edema or chest pain   Myoview 2011 which showed EF 17% with markedly dilated LV EDV: 509 ml ESV: 422 ml. Extensive scar with trivial peri-infarct ischemia. CPX 12/11 pVO2 24.5 slope 31.7 RER 1.12 O2 pulse 105% echocardiogram 5/14 demonstrated LVEF 20% with mild LAE RAE  DATE TEST EF   2011 Myoview  17 %   5/14 Echo   20 %   12/18 Echo  20-25%   1/19 R/LHC  CAD nonobstruc   Date Cr K Hgb TSH LFT  12/18 1.02 3.8 15.6 4.028 18                 Had recurrent A. fib and underwent DC cardioversion 11/28.  Subsequently developed tachycardia was found to be in VT and treated with amiodarone.   No chest pain;  No edema  Chronic short of breath and fatigue.  His biggest issue is sleep.  He takes multiple medications as well as   a pint of bourbon    Past Medical History:  Diagnosis Date  . CAD (coronary artery disease) last 2005   multiple MIs  . CHF (congestive heart failure) (HCC)    secondary to severe ischemic cardiomyopathy.   a.    EF 20% with akinesis of the distal half to two-thirds of the ventricle.  Trivial MR   b.     Status post bi-V ICD.   c.     Cardiopulmonary exercise test (12/11) pVO2 of 24.5,VE/VCO2 slope is 31,.7  . Colon polyps   . Diabetes mellitus   . Diverticulosis   . GERD (gastroesophageal reflux disease)   .  Headache(784.0)   . Hemorrhoids   . Hyperlipidemia   . 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 Insomnia   . Left bundle branch block   . Persistent atrial fibrillation ([place])   . Prostate neoplasm   . Tinnitus    comes and goes    Past Surgical History:  Procedure Laterality Date  . CARDIAC CATHETERIZATION  2005   stent x 1 placed  . CARDIOVERSION N/A 06/06/2017   Procedure: CARDIOVERSION;  Surgeon: Larey Dresser, MD;  Location: [facility] ENDOSCOPY;  Service: Cardiovascular;  Laterality: N/A;  . colonscopy     x 2  . dual chamber defibrillator  06/15/2004  . HEMORRHOID BANDING  09-2012  . RIGHT/LEFT HEART CATH AND CORONARY ANGIOGRAPHY N/A 07/20/2017   Procedure: RIGHT/LEFT HEART CATH AND CORONARY ANGIOGRAPHY;  Surgeon: Jolaine Artist, MD;  Location: [place] CV LAB;  Service: Cardiovascular;  Laterality: N/A;  . single-chamber ICD implantation  08/10/2003  . TONSILLECTOMY  age 76    Current Outpatient Medications  Medication Sig Dispense Refill  . acetaminophen (TYLENOL) 500 MG tablet Take 1,000 mg by mouth daily as needed for moderate pain.    Marland Kitchen amiodarone (PACERONE) 200 MG tablet Take  1 tablet (200 mg total) by mouth daily. 90 tablet 2  . DiphenhydrAMINE HCl (ZZZQUIL) 50 MG/30ML LIQD Take 35 mg by mouth at bedtime as needed (sleep).    Marland Kitchen ELIQUIS 5 MG TABS tablet TAKE 1 TABLET BY MOUTH TWICE DAILY 60 tablet 5  . esomeprazole (NEXIUM) 20 MG capsule Take 20 mg by mouth daily.     . furosemide (LASIX) 40 MG tablet Take 1 tablet (40 mg total) by mouth every morning. 90 tablet 3  . glimepiride (AMARYL) 4 MG tablet Take 4 mg by mouth every evening.     . loperamide (IMODIUM A-D) 2 MG tablet Take 4 mg by mouth daily as needed for diarrhea or loose stools.    . metoprolol succinate (TOPROL-XL) 100 MG 24 hr tablet TAKE 1 TABLET BY MOUTH ONCE DAILY WITH FOOD OR IMMEDIATELY FOLLOWING A MEAL 90 tablet 3  . nitroGLYCERIN (NITROSTAT) 0.4 MG SL  tablet Place 1 tablet (0.4 mg total) under the tongue every 5 (five) minutes as needed for chest pain. 25 tablet 6  . sacubitril-valsartan (ENTRESTO) 97-103 MG Take 1 tablet by mouth 2 (two) times daily. 60 tablet 6  . spironolactone (ALDACTONE) 25 MG tablet TAKE ONE TABLET BY MOUTH ONCE DAILY IN THE MORNING 90 tablet 3  . Tetrahydrozoline HCl (VISINE OP) Apply 3 drops to eye daily as needed (dry eyes).    Marland Kitchen zolpidem (AMBIEN CR) 12.5 MG CR tablet Take 12.5 mg by mouth at bedtime.      No current facility-administered medications for this visit.   Reviewed  Allergies  Allergen Reactions  . Coreg [Carvedilol] Diarrhea  . Entresto [Sacubitril-Valsartan] Other (See Comments)    Joint pain  . Jardiance [Empagliflozin] Other (See Comments)    Possible afib, definitely cost  . Lipitor [Atorvastatin] Diarrhea    Review of Systems negative except from HPI and PMH  Physical Exam BP 118/74   Pulse 60   Ht 6\' 4"  (1.93 m)   Wt 240 lb 6.4 oz (109 kg)   SpO2 92%   BMI 29.26 kg/m  Well developed and nourished in no acute distress HENT normal Neck supple with JVP-flat Clear Regular rate and rhythm, no murmurs or gallops Abd-soft with active BS No Clubbing cyanosis edema Skin-warm and dry A & Oriented  Grossly normal sensory and motor function    ECG demonstrates sinus rhythm at 49 Interval 16/19/55 QRS upright lead V1 and negative lead I   Assessment and  Plan  Ischemic cardiomyopathy  High Risk Medication Surveillance  Congestive heart failure-chronic-systolic     Implantable defibrillator-CRT  The patient's device was interrogated.  The information was reviewed. No changes were made in the programming.    Ventricular tachycardia  Atrial fibrillation   Poor sleep hygiene  No interval atrial fibrillation.  Continue on amiodarone and apixaban.  Will be held for his catheterization  No interval ventricular tachycardia.  Amiodarone is being weaned.  We will continue with 200  mg daily.  Euvolemic continue current meds  Without symptoms of ischemia  We had a lengthy discussion regarding sleep hygiene.  We talked about the use of noise makers.  We talked about the importance of not having abrupt changes in sound.  He will work on these things.  More than 50% of 45 min was spent in counseling related to the above

## 2018-01-17 NOTE — Patient Instructions (Addendum)
Medication Instructions:  Your physician recommends that you continue on your current medications as directed. Please refer to the Current Medication list given to you today.  Labwork: None ordered.  Testing/Procedures: None ordered.  Follow-Up: Your physician recommends that you schedule a follow-up appointment in: One Year with Dr Caryl Comes  Remote monitoring is used to monitor your Pacemaker of ICD from home. This monitoring reduces the number of office visits required to check your device to one time per year. It allows [place] to keep an eye on the functioning of your device to ensure it is working properly. You are scheduled for a device check from home on 03/06/2018. You may send your transmission at any time that day. If you have a wireless device, the transmission will be sent automatically. After your physician reviews your transmission, you will receive a postcard with your next transmission date.     Any Other Special Instructions Will Be Listed Below (If Applicable).     If you need a refill on your cardiac medications before your next appointment, please call your pharmacy.

## 2018-01-18 LAB — BASIC METABOLIC PANEL
BUN / CREAT RATIO: 14 (ref 10–24)
BUN: 18 mg/dL (ref 8–27)
CO2: 21 mmol/L (ref 20–29)
Calcium: 9.8 mg/dL (ref 8.6–10.2)
Chloride: 98 mmol/L (ref 96–106)
Creatinine, Ser: 1.26 mg/dL (ref 0.76–1.27)
GFR, EST AFRICAN AMERICAN: 70 mL/min/{1.73_m2} (ref >59)
GFR, EST NON AFRICAN AMERICAN: 61 mL/min/{1.73_m2} (ref >59)
Glucose: 107 mg/dL — ABNORMAL HIGH (ref 65–99)
POTASSIUM: 4.8 mmol/L (ref 3.5–5.2)
SODIUM: 138 mmol/L (ref 134–144)

## 2018-01-18 LAB — CBC WITH DIFFERENTIAL/PLATELET
BASOS ABS: 0.1 10*3/uL (ref 0.0–0.2)
Basos: 1 %
EOS (ABSOLUTE): 0.1 10*3/uL (ref 0.0–0.4)
Eos: 2 %
HEMATOCRIT: 47.3 % (ref 37.5–51.0)
HEMOGLOBIN: 15.5 g/dL (ref 13.0–17.7)
Immature Grans (Abs): 0 10*3/uL (ref 0.0–0.1)
Immature Granulocytes: 1 %
LYMPHS ABS: 1.2 10*3/uL (ref 0.7–3.1)
Lymphs: 20 %
MCH: 29.8 pg (ref 26.6–33.0)
MCHC: 32.8 g/dL (ref 31.5–35.7)
MCV: 91 fL (ref 79–97)
Monocytes Absolute: 0.6 10*3/uL (ref 0.1–0.9)
Monocytes: 9 %
NEUTROS ABS: 4 10*3/uL (ref 1.4–7.0)
Neutrophils: 67 %
Platelets: 245 10*3/uL (ref 150–450)
RBC: 5.2 x10E6/uL (ref 4.14–5.80)
RDW: 14.2 % (ref 12.3–15.4)
WBC: 5.9 10*3/uL (ref 3.4–10.8)

## 2018-01-18 LAB — HEPATIC FUNCTION PANEL
ALBUMIN: 4.3 g/dL (ref 3.6–4.8)
ALK PHOS: 57 IU/L (ref 39–117)
ALT: 23 IU/L (ref 0–44)
AST: 16 IU/L (ref 0–40)
Bilirubin Total: 0.5 mg/dL (ref 0.0–1.2)
Bilirubin, Direct: 0.12 mg/dL (ref 0.00–0.40)
Total Protein: 7.3 g/dL (ref 6.0–8.5)

## 2018-01-18 LAB — TSH: TSH: 4.01 u[IU]/mL (ref 0.450–4.500)

## 2018-03-06 ENCOUNTER — Other Ambulatory Visit: Payer: Self-pay | Admitting: Internal Medicine

## 2018-03-06 DIAGNOSIS — F17201 Nicotine dependence, unspecified, in remission: Secondary | ICD-10-CM

## 2018-03-07 ENCOUNTER — Ambulatory Visit (INDEPENDENT_AMBULATORY_CARE_PROVIDER_SITE_OTHER): Payer: Medicare Other | Admitting: *Deleted

## 2018-03-07 DIAGNOSIS — I5022 Chronic systolic (congestive) heart failure: Secondary | ICD-10-CM

## 2018-03-07 DIAGNOSIS — I255 Ischemic cardiomyopathy: Secondary | ICD-10-CM

## 2018-03-07 NOTE — Progress Notes (Signed)
Remote ICD transmission.   

## 2018-03-15 ENCOUNTER — Ambulatory Visit: Payer: Medicare Other

## 2018-03-15 ENCOUNTER — Ambulatory Visit
Admission: RE | Admit: 2018-03-15 | Discharge: 2018-03-15 | Disposition: A | Discharge status: Home / Self Care | Payer: Medicare Other | Source: Ambulatory Visit | Attending: Internal Medicine | Admitting: Internal Medicine

## 2018-03-15 DIAGNOSIS — F17201 Nicotine dependence, unspecified, in remission: Secondary | ICD-10-CM

## 2018-04-03 LAB — CUP PACEART REMOTE DEVICE CHECK
Battery Remaining Longevity: 42 mo
Brady Statistic RA Percent Paced: 75 %
Brady Statistic RV Percent Paced: 99 %
Date Time Interrogation Session: 20190828082600
HIGH POWER IMPEDANCE MEASURED VALUE: 66 Ohm
Implantable Lead Implant Date: 20051207
Implantable Lead Implant Date: 20051207
Implantable Lead Implant Date: 20110721
Implantable Lead Location: 753858
Implantable Lead Location: 753859
Implantable Lead Model: 5076
Implantable Lead Serial Number: 148938
Implantable Pulse Generator Implant Date: 20110721
Lead Channel Impedance Value: 475 Ohm
Lead Channel Impedance Value: 525 Ohm
Lead Channel Impedance Value: 828 Ohm
Lead Channel Pacing Threshold Amplitude: 0.7 V
Lead Channel Pacing Threshold Amplitude: 0.9 V
Lead Channel Pacing Threshold Amplitude: 1 V
Lead Channel Pacing Threshold Pulse Width: 0.4 ms
Lead Channel Setting Pacing Amplitude: 1.9 V
Lead Channel Setting Pacing Amplitude: 2 V
Lead Channel Setting Pacing Amplitude: 2.4 V
Lead Channel Setting Pacing Pulse Width: 0.4 ms
Lead Channel Setting Pacing Pulse Width: 0.4 ms
Lead Channel Setting Sensing Sensitivity: 1 mV
MDC IDC LEAD LOCATION: 753860
MDC IDC LEAD SERIAL: 205859
MDC IDC MSMT BATTERY REMAINING PERCENTAGE: 63 %
MDC IDC MSMT LEADCHNL LV PACING THRESHOLD PULSEWIDTH: 0.4 ms
MDC IDC MSMT LEADCHNL RV PACING THRESHOLD PULSEWIDTH: 0.4 ms
MDC IDC PG SERIAL: 139171
MDC IDC SET LEADCHNL RV SENSING SENSITIVITY: 0.5 mV

## 2018-05-01 ENCOUNTER — Telehealth (HOSPITAL_COMMUNITY): Payer: Self-pay | Admitting: *Deleted

## 2018-05-01 NOTE — Telephone Encounter (Signed)
Pt needs clearance to have 15 teeth extracted, they will do top teeth one day and the bottom teeth another day so all 15 will not be removed at the same time.  They need clearance from Dr Haroldine Laws for pt to have this done and recommendations for Eliquis.  Will send to Dr Haroldine Laws for review.

## 2018-05-01 NOTE — Telephone Encounter (Signed)
Ok to proceed. Hold eliquis for 48 hours prior to procedure and 24 hours after.

## 2018-05-02 NOTE — Telephone Encounter (Signed)
Note faxed to dental office at [phone] (phone [phone])

## 2018-06-12 ENCOUNTER — Ambulatory Visit (INDEPENDENT_AMBULATORY_CARE_PROVIDER_SITE_OTHER): Payer: Medicare Other

## 2018-06-12 ENCOUNTER — Telehealth: Payer: Self-pay

## 2018-06-12 DIAGNOSIS — I255 Ischemic cardiomyopathy: Secondary | ICD-10-CM

## 2018-06-12 NOTE — Telephone Encounter (Signed)
Spoke with pt and reminded pt of remote transmission that is due today. Pt verbalized understanding.   

## 2018-06-12 NOTE — Progress Notes (Signed)
Remote ICD transmission.   

## 2018-06-18 ENCOUNTER — Encounter: Payer: Self-pay | Admitting: Cardiology

## 2018-07-11 ENCOUNTER — Other Ambulatory Visit: Payer: Self-pay | Admitting: Internal Medicine

## 2018-07-11 DIAGNOSIS — I255 Ischemic cardiomyopathy: Secondary | ICD-10-CM

## 2018-07-31 LAB — CUP PACEART REMOTE DEVICE CHECK
Battery Remaining Percentage: 57 %
Brady Statistic RV Percent Paced: 99 %
Date Time Interrogation Session: 20191204152700
HIGH POWER IMPEDANCE MEASURED VALUE: 64 Ohm
Implantable Lead Implant Date: 20051207
Implantable Lead Implant Date: 20110721
Implantable Lead Location: 753858
Implantable Lead Location: 753860
Implantable Lead Model: 4543
Implantable Lead Model: 5076
Implantable Pulse Generator Implant Date: 20110721
Lead Channel Impedance Value: 522 Ohm
Lead Channel Pacing Threshold Amplitude: 0.7 V
Lead Channel Pacing Threshold Amplitude: 0.9 V
Lead Channel Pacing Threshold Amplitude: 1 V
Lead Channel Pacing Threshold Pulse Width: 0.4 ms
Lead Channel Pacing Threshold Pulse Width: 0.4 ms
Lead Channel Pacing Threshold Pulse Width: 0.4 ms
Lead Channel Setting Pacing Amplitude: 2 V
Lead Channel Setting Pacing Amplitude: 2.4 V
Lead Channel Setting Pacing Pulse Width: 0.4 ms
Lead Channel Setting Sensing Sensitivity: 0.5 mV
MDC IDC LEAD IMPLANT DT: 20051207
MDC IDC LEAD LOCATION: 753859
MDC IDC LEAD SERIAL: 148938
MDC IDC LEAD SERIAL: 205859
MDC IDC MSMT BATTERY REMAINING LONGEVITY: 36 mo
MDC IDC MSMT LEADCHNL LV IMPEDANCE VALUE: 849 Ohm
MDC IDC MSMT LEADCHNL RV IMPEDANCE VALUE: 469 Ohm
MDC IDC SET LEADCHNL LV PACING AMPLITUDE: 1.9 V
MDC IDC SET LEADCHNL LV SENSING SENSITIVITY: 1 mV
MDC IDC SET LEADCHNL RV PACING PULSEWIDTH: 0.4 ms
MDC IDC STAT BRADY RA PERCENT PACED: 79 %
Pulse Gen Serial Number: 139171

## 2018-08-15 ENCOUNTER — Other Ambulatory Visit: Payer: Self-pay | Admitting: Internal Medicine

## 2018-08-15 DIAGNOSIS — I255 Ischemic cardiomyopathy: Secondary | ICD-10-CM

## 2018-09-05 ENCOUNTER — Other Ambulatory Visit: Payer: Self-pay | Admitting: Internal Medicine

## 2018-09-11 ENCOUNTER — Ambulatory Visit (INDEPENDENT_AMBULATORY_CARE_PROVIDER_SITE_OTHER): Payer: Medicare Other | Admitting: *Deleted

## 2018-09-11 DIAGNOSIS — I255 Ischemic cardiomyopathy: Secondary | ICD-10-CM | POA: Diagnosis not present

## 2018-09-11 DIAGNOSIS — I5022 Chronic systolic (congestive) heart failure: Secondary | ICD-10-CM

## 2018-09-14 LAB — CUP PACEART REMOTE DEVICE CHECK
Brady Statistic RA Percent Paced: 84 %
Date Time Interrogation Session: 20200304090600
HighPow Impedance: 65 Ohm
Implantable Lead Implant Date: 20051207
Implantable Lead Implant Date: 20051207
Implantable Lead Location: 753858
Implantable Lead Model: 158
Implantable Lead Model: 4543
Implantable Lead Serial Number: 205859
Implantable Pulse Generator Implant Date: 20110721
Lead Channel Impedance Value: 485 Ohm
Lead Channel Pacing Threshold Amplitude: 0.9 V
Lead Channel Pacing Threshold Pulse Width: 0.4 ms
Lead Channel Pacing Threshold Pulse Width: 0.4 ms
Lead Channel Setting Pacing Amplitude: 1.9 V
Lead Channel Setting Pacing Amplitude: 2 V
Lead Channel Setting Pacing Pulse Width: 0.4 ms
Lead Channel Setting Sensing Sensitivity: 0.5 mV
Lead Channel Setting Sensing Sensitivity: 1 mV
MDC IDC LEAD IMPLANT DT: 20110721
MDC IDC LEAD LOCATION: 753859
MDC IDC LEAD LOCATION: 753860
MDC IDC LEAD SERIAL: 148938
MDC IDC MSMT BATTERY REMAINING LONGEVITY: 36 mo
MDC IDC MSMT BATTERY REMAINING PERCENTAGE: 53 %
MDC IDC MSMT LEADCHNL LV IMPEDANCE VALUE: 819 Ohm
MDC IDC MSMT LEADCHNL LV PACING THRESHOLD AMPLITUDE: 1 V
MDC IDC MSMT LEADCHNL LV PACING THRESHOLD PULSEWIDTH: 0.4 ms
MDC IDC MSMT LEADCHNL RA IMPEDANCE VALUE: 541 Ohm
MDC IDC MSMT LEADCHNL RA PACING THRESHOLD AMPLITUDE: 0.7 V
MDC IDC SET LEADCHNL RV PACING AMPLITUDE: 2.4 V
MDC IDC SET LEADCHNL RV PACING PULSEWIDTH: 0.4 ms
MDC IDC STAT BRADY RV PERCENT PACED: 99 %
Pulse Gen Serial Number: 139171

## 2018-09-16 ENCOUNTER — Other Ambulatory Visit (HOSPITAL_COMMUNITY): Payer: Self-pay | Admitting: Internal Medicine

## 2018-09-16 ENCOUNTER — Other Ambulatory Visit: Payer: Self-pay | Admitting: Internal Medicine

## 2018-09-16 DIAGNOSIS — I255 Ischemic cardiomyopathy: Secondary | ICD-10-CM

## 2018-09-19 NOTE — Progress Notes (Signed)
Remote ICD transmission.   

## 2018-11-12 ENCOUNTER — Other Ambulatory Visit: Payer: Self-pay | Admitting: Internal Medicine

## 2018-12-11 ENCOUNTER — Ambulatory Visit (INDEPENDENT_AMBULATORY_CARE_PROVIDER_SITE_OTHER): Payer: Medicare Other | Admitting: *Deleted

## 2018-12-11 DIAGNOSIS — I255 Ischemic cardiomyopathy: Secondary | ICD-10-CM | POA: Diagnosis not present

## 2018-12-11 LAB — CUP PACEART REMOTE DEVICE CHECK
Battery Remaining Longevity: 30 mo
Battery Remaining Percentage: 52 %
Brady Statistic RA Percent Paced: 86 %
Brady Statistic RV Percent Paced: 99 %
Date Time Interrogation Session: 20200603082600
HighPow Impedance: 67 Ohm
Implantable Lead Implant Date: 20051207
Implantable Lead Implant Date: 20051207
Implantable Lead Implant Date: 20110721
Implantable Lead Location: 753858
Implantable Lead Location: 753859
Implantable Lead Location: 753860
Implantable Lead Model: 158
Implantable Lead Model: 4543
Implantable Lead Model: 5076
Implantable Lead Serial Number: 148938
Implantable Lead Serial Number: 205859
Implantable Pulse Generator Implant Date: 20110721
Lead Channel Impedance Value: 521 Ohm
Lead Channel Impedance Value: 567 Ohm
Lead Channel Impedance Value: 906 Ohm
Lead Channel Pacing Threshold Amplitude: 0.7 V
Lead Channel Pacing Threshold Amplitude: 0.9 V
Lead Channel Pacing Threshold Amplitude: 1 V
Lead Channel Pacing Threshold Pulse Width: 0.4 ms
Lead Channel Pacing Threshold Pulse Width: 0.4 ms
Lead Channel Pacing Threshold Pulse Width: 0.4 ms
Lead Channel Setting Pacing Amplitude: 1.9 V
Lead Channel Setting Pacing Amplitude: 2 V
Lead Channel Setting Pacing Amplitude: 2.4 V
Lead Channel Setting Pacing Pulse Width: 0.4 ms
Lead Channel Setting Pacing Pulse Width: 0.4 ms
Lead Channel Setting Sensing Sensitivity: 0.5 mV
Lead Channel Setting Sensing Sensitivity: 1 mV
Pulse Gen Serial Number: 139171

## 2018-12-19 ENCOUNTER — Encounter: Payer: Self-pay | Admitting: Cardiology

## 2018-12-19 NOTE — Progress Notes (Signed)
Remote ICD transmission.   

## 2018-12-30 ENCOUNTER — Other Ambulatory Visit: Payer: Self-pay

## 2018-12-30 ENCOUNTER — Encounter (HOSPITAL_COMMUNITY): Payer: Self-pay | Admitting: Internal Medicine

## 2018-12-30 ENCOUNTER — Ambulatory Visit (HOSPITAL_COMMUNITY)
Admission: RE | Admit: 2018-12-30 | Discharge: 2018-12-30 | Disposition: A | Discharge status: Home / Self Care | Payer: Medicare Other | Source: Ambulatory Visit | Attending: Internal Medicine | Admitting: Internal Medicine

## 2018-12-30 VITALS — BP 130/82 | HR 65 | Wt 233.8 lb

## 2018-12-30 DIAGNOSIS — I255 Ischemic cardiomyopathy: Secondary | ICD-10-CM | POA: Diagnosis not present

## 2018-12-30 DIAGNOSIS — Z7901 Long term (current) use of anticoagulants: Secondary | ICD-10-CM | POA: Insufficient documentation

## 2018-12-30 DIAGNOSIS — G4700 Insomnia, unspecified: Secondary | ICD-10-CM | POA: Insufficient documentation

## 2018-12-30 DIAGNOSIS — R0602 Shortness of breath: Secondary | ICD-10-CM | POA: Diagnosis not present

## 2018-12-30 DIAGNOSIS — E119 Type 2 diabetes mellitus without complications: Secondary | ICD-10-CM | POA: Insufficient documentation

## 2018-12-30 DIAGNOSIS — I2510 Atherosclerotic heart disease of native coronary artery without angina pectoris: Secondary | ICD-10-CM | POA: Insufficient documentation

## 2018-12-30 DIAGNOSIS — I110 Hypertensive heart disease with heart failure: Secondary | ICD-10-CM | POA: Insufficient documentation

## 2018-12-30 DIAGNOSIS — K219 Gastro-esophageal reflux disease without esophagitis: Secondary | ICD-10-CM | POA: Insufficient documentation

## 2018-12-30 DIAGNOSIS — I252 Old myocardial infarction: Secondary | ICD-10-CM | POA: Diagnosis not present

## 2018-12-30 DIAGNOSIS — F4310 Post-traumatic stress disorder, unspecified: Secondary | ICD-10-CM | POA: Diagnosis not present

## 2018-12-30 DIAGNOSIS — Z955 Presence of coronary angioplasty implant and graft: Secondary | ICD-10-CM | POA: Diagnosis not present

## 2018-12-30 DIAGNOSIS — I480 Paroxysmal atrial fibrillation: Secondary | ICD-10-CM | POA: Insufficient documentation

## 2018-12-30 DIAGNOSIS — I471 Supraventricular tachycardia: Secondary | ICD-10-CM | POA: Diagnosis not present

## 2018-12-30 DIAGNOSIS — Z95810 Presence of automatic (implantable) cardiac defibrillator: Secondary | ICD-10-CM | POA: Diagnosis not present

## 2018-12-30 DIAGNOSIS — E785 Hyperlipidemia, unspecified: Secondary | ICD-10-CM | POA: Insufficient documentation

## 2018-12-30 DIAGNOSIS — Z79899 Other long term (current) drug therapy: Secondary | ICD-10-CM | POA: Diagnosis not present

## 2018-12-30 DIAGNOSIS — I5022 Chronic systolic (congestive) heart failure: Secondary | ICD-10-CM | POA: Insufficient documentation

## 2018-12-30 DIAGNOSIS — Z7984 Long term (current) use of oral hypoglycemic drugs: Secondary | ICD-10-CM | POA: Insufficient documentation

## 2018-12-30 LAB — COMPREHENSIVE METABOLIC PANEL
ALT: 35 U/L (ref 0–44)
AST: 25 U/L (ref 15–41)
Albumin: 3.9 g/dL (ref 3.5–5.0)
Alkaline Phosphatase: 46 U/L (ref 38–126)
Anion gap: 10 (ref 5–15)
BUN: 11 mg/dL (ref 8–23)
CO2: 25 mmol/L (ref 22–32)
Calcium: 9.5 mg/dL (ref 8.9–10.3)
Chloride: 102 mmol/L (ref 98–111)
Creatinine, Ser: 1.19 mg/dL (ref 0.61–1.24)
GFR calc Af Amer: >60 mL/min (ref >60)
GFR calc non Af Amer: >60 mL/min (ref >60)
Glucose, Bld: 111 mg/dL — ABNORMAL HIGH (ref 70–99)
Potassium: 4 mmol/L (ref 3.5–5.1)
Sodium: 137 mmol/L (ref 135–145)
Total Bilirubin: 0.7 mg/dL (ref 0.3–1.2)
Total Protein: 6.8 g/dL (ref 6.5–8.1)

## 2018-12-30 LAB — TSH: TSH: 3.468 u[IU]/mL (ref 0.350–4.500)

## 2018-12-30 LAB — T4, FREE: Free T4: 1 ng/dL (ref 0.61–1.12)

## 2018-12-30 MED ORDER — ASPIRIN EC 81 MG PO TBEC: 81.0000 mg | DELAYED_RELEASE_TABLET | Freq: Every day | ORAL | 3 refills | Status: AC

## 2018-12-30 MED ORDER — RAMIPRIL 10 MG PO CAPS: 10.0000 mg | ORAL_CAPSULE | Freq: Every day | ORAL | 3 refills | Status: DC

## 2018-12-30 MED ORDER — AMIODARONE HCL 100 MG PO TABS: 100.0000 mg | ORAL_TABLET | Freq: Every day | ORAL | 3 refills | Status: DC

## 2018-12-30 NOTE — Patient Instructions (Addendum)
Labs done today. We will only call if labs are abnormal.   STOP Entresto and Eliquis START Ramipril 10mg  (1 Tab) daily. DECREASE Amiodarone to 100mg  (1 Tab) daily    Your physician has requested that you have an echocardiogram. Echocardiography is a painless test that uses sound waves to create images of your heart. It provides your doctor with information about the size and shape of your heart and how well your heart's chambers and valves are working. This procedure takes approximately one hour. There are no restrictions for this procedure.  Someone will contact you about this appointment time.   Your physician recommends that you schedule a follow-up appointment in: 12 months. We will contact you closer to the time to schedule an appointment.  At the [facility], you and your health needs are our priority. As part of our continuing mission to provide you with exceptional heart care, we have created designated Provider Care Teams. These Care Teams include your primary Cardiologist (physician) and Advanced Practice Providers (APPs- Physician Assistants and Nurse Practitioners) who all work together to provide you with the care you need, when you need it.   You may see any of the following providers on your designated Care Team at your next follow up: Marland Kitchen Dr Glori Bickers . Dr Loralie Champagne . Darrick Grinder, NP

## 2018-12-30 NOTE — Addendum Note (Signed)
Encounter addended by: Shonna Chock, CMA on: 8/37/2902 1:02 PM  Actions taken: Medication long-term status modified, Diagnosis association updated, Order list changed, Clinical Note Signed, Charge Capture section accepted

## 2018-12-30 NOTE — Progress Notes (Signed)
Patient ID: [name], male   DOB: 03/07/1956, 63 y.o.   MRN: [id number]   ADVANCED HF CLINIC NOTE  PCP: Dr Brigitte Pulse EP: Dr Caryl Comes Cardiologist: Dr Haroldine Laws  HPI: [name] is a 63 year old male with history of chronic systolic heart failure EF 20% 2009, ICM s/p 2 myocardial infarctions and previous stents, [place] Sci BiVICD in place, S/P device generator replacement in July 2011, DM,  obesity, HTN, and a low HDL. Disabled since 2011.   Myoview 7/11 which showed EF 17% with markedly dilated LV  EDV: 509 ml ESV: 422 ml. Extensive scar with trivial peri-infarct ischemia.  CPX  12/11 pVO2 24.5 slope 31.7 RER 1.12 O2 pulse 105%   In April 2011 received 7 ICD shocks for SVT and now has PTSD from the event.Tried increasing carvedilol in past, but did not tolerate due to diarrhea.   Echo 5/14: EF 20%. RV normal.   Dr. Brigitte Pulse started [place] and he was peeing a lot and relates it to him having AF. So he stopped.   Developed recurrent AF and underwent outpatient DC-CV on 06/06/17. He was feeling ok but developed tachycardia and he called EP and transmission showed VT. He presented to [facility] in [place] and was loaded on amiodarone which broke VT. Echo 06/09/17 EF 20-25%. \  Today he returns for 1 year follow up. Overall feeling fine. SOB with exertion. SOB cleaning. Denies PND/Orthopnea.  No chest pain. Appetite ok. No fever or chills. Weight at home has been stable.  Taking all medications. Having trouble paying for eliquis and entresto.   ICD interrogated in clinic.   Cath 1/19 Ao = 97/58 (73) LV = 97/14 RA = 1 RV = 33/3 PA = 35/6 (21) PCW = 6 Fick cardiac output/index = 5.2/2.2 PVR =2.9 WU FA sat = 95% PA sat = 66%, 67%  Assessment: 1. 3v CAD with patent RCA stent and CTO LAD 2. ICM EF 15-20% 3. Well-compensated hemodynamics  CPX 07/13/16   FVC 4.82 (86%)    FEV1 3.40 (80%)     FEV1/FVC 71 (92%)     MVV 125 (78%)     Resting HR: 58 Peak HR: 113  (72% age predicted max HR) BP  rest: 112/70 BP peak: 146/62 Peak VO2: 19.5 (64% predicted peak VO2) VE/VCO2 slope: 33 OUES: 2.24 Peak RER: 1.06  Ventilatory Threshold: 16.3 (53% predicted and 84% measured peak VO2) VE/MVV: 62% O2pulse: 18  (90% predicted O2pulse)  ROS: All systems negative except as listed in HPI, PMH and Problem List.  Past Medical History:  Diagnosis Date  . CAD (coronary artery disease) last 2005   multiple MIs  . CHF (congestive heart failure) (HCC)    secondary to severe ischemic cardiomyopathy.   a.    EF 20% with akinesis of the distal half to two-thirds of the ventricle.  Trivial MR   b.     Status post bi-V ICD.   c.     Cardiopulmonary exercise test (12/11) pVO2 of 24.5,VE/VCO2 slope is 31,.7  . Colon polyps   . Diabetes mellitus   . Diverticulosis   . GERD (gastroesophageal reflux disease)   . Headache(784.0)   . Hemorrhoids   . Hyperlipidemia   . 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 Insomnia   . Left bundle branch block   . Persistent atrial fibrillation   . Prostate neoplasm   . Tinnitus    comes and goes    Current  Outpatient Medications  Medication Sig Dispense Refill  . acetaminophen (TYLENOL) 500 MG tablet Take 1,000 mg by mouth daily as needed for moderate pain.    Marland Kitchen amiodarone (PACERONE) 200 MG tablet Take 1 tablet by mouth once daily 90 tablet 0  . DiphenhydrAMINE HCl (ZZZQUIL) 50 MG/30ML LIQD Take 35 mg by mouth at bedtime as needed (sleep).    Marland Kitchen ELIQUIS 5 MG TABS tablet Take 1 tablet by mouth twice daily 180 tablet 0  . ENTRESTO 97-103 MG Take 1 tablet by mouth twice daily 180 tablet 0  . esomeprazole (NEXIUM) 20 MG capsule Take 20 mg by mouth daily. 0.5 tab (10 mg) daily    . furosemide (LASIX) 40 MG tablet TAKE 1 TABLET BY MOUTH IN THE MORNING 90 tablet 1  . glimepiride (AMARYL) 4 MG tablet Take 4 mg by mouth every evening.     . loperamide (IMODIUM A-D) 2 MG tablet Take 4 mg by mouth daily as needed for  diarrhea or loose stools.    . metoprolol succinate (TOPROL-XL) 100 MG 24 hr tablet TAKE 1 TABLET BY MOUTH ONCE DAILY WITH FOOD OR  IMMEDIATELY  FOLLOWING  A  MEAL 90 tablet 1  . nitroGLYCERIN (NITROSTAT) 0.4 MG SL tablet Place 1 tablet (0.4 mg total) under the tongue every 5 (five) minutes as needed for chest pain. 25 tablet 6  . spironolactone (ALDACTONE) 25 MG tablet TAKE 1 TABLET BY MOUTH ONCE DAILY IN THE MORNING 90 tablet 1  . Tetrahydrozoline HCl (VISINE OP) Apply 3 drops to eye daily as needed (dry eyes).    Marland Kitchen zolpidem (AMBIEN CR) 12.5 MG CR tablet Take 12.5 mg by mouth at bedtime.      No current facility-administered medications for this encounter.      PHYSICAL EXAM: Vitals:   12/30/18 1146  BP: 130/82  Pulse: 65  SpO2: 97%   Filed Weights   12/30/18 1146  Weight: 106.1 kg (233 lb 12.8 oz)   General:   General:  Well appearing. No resp difficulty HEENT: normal Neck: supple. no JVD. Carotids 2+ bilat; no bruits. No lymphadenopathy or thryomegaly appreciated. Cor: PMI nondisplaced. Regular rate & rhythm. No rubs, gallops or murmurs. Lungs: clear Abdomen: soft, nontender, nondistended. No hepatosplenomegaly. No bruits or masses. Good bowel sounds. Extremities: no cyanosis, clubbing, rash, edema Neuro: alert & orientedx3, cranial nerves grossly intact. moves all 4 extremities w/o difficulty. Affect pleasant   ASSESSMENT & PLAN: 1. Chronic systolic HF due to iCM EF 20% - CPX 07/2017 and cath results very reassuring. Only mild to moderate HF limitation - Last echo 12/18 EF 20-25% - Stable NYHA II-III symptoms - Volume status looks good despite weight gain - He is on Entresto 97/103 but can no longer afford. Wants to switch back to Ramipril 10 daily.  - Continue current dose of Toprol xl and spiro 25 - check BMET   2. CAD - Stable by cath 1/19 - He refuses statin due to GI upset.  - Consider referral to lipid clinic for PCSK-9 - PCP following.   3. PAF - Status  post  DC-CV 11/18. Remains in NSR - Continue amio. Will decrease to 100 daily. Check CMET and TFTs - Can no longer afford apixaban. Wants to go back to ASA - Follows with Dr. Caryl Comes  4. [place] - ICD interrogated as above.   5. DM2 - Per PCP. Did not tolerate Jardiance.   6. HL - Refuses statin due to GI upset. . Consider PCSK9  inhibitor - PCP managing cholesterol.   Follow up 1 year. Check BMET today    Darrick Grinder, NP-C  12:04 PM  Patient seen and examined with Darrick Grinder, NP. We discussed all aspects of the encounter. I agree with the assessment and plan as stated above.   Overall stable. NYHA III. Volume status stable. ICD interrogated in person. No further VT or AF. Volume OK.   Due to cost issues wants to stop Entresto and switch back to Ramipril 10. Also wants to stop Eliquis and switch back to ASA. Aware of risk of stroke.   Glori Bickers, MD  12:41 PM

## 2018-12-31 LAB — T3, FREE: T3, Free: 2.7 pg/mL (ref 2.0–4.4)

## 2019-01-14 ENCOUNTER — Telehealth: Payer: Self-pay

## 2019-01-14 NOTE — Telephone Encounter (Signed)
I called and spoke with patient about upcoming appointment on 01/23/19, patient just saw Dr. Haroldine Laws on 12/30/18 and had his pacemaker checked. Dr. Haroldine Laws changed Amiodarone to 100MG  once a day instead of 200MG  once a day. Patient was having tremors on 200MG  a day. Patient would like to have an office visit and not virtual visit. Does he need to come back and see Dr. Caryl Comes? I have cancelled 01/23/19 appointment with Dr. Caryl Comes, if patient needs office visit now please send to scheduler to have office visit scheduled.

## 2019-01-23 ENCOUNTER — Encounter: Payer: Medicare Other | Admitting: Internal Medicine

## 2019-03-12 ENCOUNTER — Ambulatory Visit (INDEPENDENT_AMBULATORY_CARE_PROVIDER_SITE_OTHER): Payer: Medicare Other | Admitting: *Deleted

## 2019-03-12 ENCOUNTER — Other Ambulatory Visit: Payer: Self-pay | Admitting: Internal Medicine

## 2019-03-12 DIAGNOSIS — I472 Ventricular tachycardia, unspecified: Secondary | ICD-10-CM

## 2019-03-12 DIAGNOSIS — I255 Ischemic cardiomyopathy: Secondary | ICD-10-CM

## 2019-03-13 LAB — CUP PACEART REMOTE DEVICE CHECK
Battery Remaining Longevity: 30 mo
Battery Remaining Percentage: 48 %
Brady Statistic RA Percent Paced: 84 %
Brady Statistic RV Percent Paced: 99 %
Date Time Interrogation Session: 20200903132200
HighPow Impedance: 71 Ohm
Implantable Lead Implant Date: 20051207
Implantable Lead Implant Date: 20051207
Implantable Lead Implant Date: 20110721
Implantable Lead Location: 753858
Implantable Lead Location: 753859
Implantable Lead Location: 753860
Implantable Lead Model: 158
Implantable Lead Model: 4543
Implantable Lead Model: 5076
Implantable Lead Serial Number: 148938
Implantable Lead Serial Number: 205859
Implantable Pulse Generator Implant Date: 20110721
Lead Channel Impedance Value: 486 Ohm
Lead Channel Impedance Value: 568 Ohm
Lead Channel Impedance Value: 948 Ohm
Lead Channel Pacing Threshold Amplitude: 0.7 V
Lead Channel Pacing Threshold Amplitude: 0.9 V
Lead Channel Pacing Threshold Amplitude: 1 V
Lead Channel Pacing Threshold Pulse Width: 0.4 ms
Lead Channel Pacing Threshold Pulse Width: 0.4 ms
Lead Channel Pacing Threshold Pulse Width: 0.4 ms
Lead Channel Setting Pacing Amplitude: 1.9 V
Lead Channel Setting Pacing Amplitude: 2 V
Lead Channel Setting Pacing Amplitude: 2.4 V
Lead Channel Setting Pacing Pulse Width: 0.4 ms
Lead Channel Setting Pacing Pulse Width: 0.4 ms
Lead Channel Setting Sensing Sensitivity: 0.5 mV
Lead Channel Setting Sensing Sensitivity: 1 mV
Pulse Gen Serial Number: 139171

## 2019-03-21 ENCOUNTER — Other Ambulatory Visit: Payer: Self-pay | Admitting: Internal Medicine

## 2019-03-21 DIAGNOSIS — I255 Ischemic cardiomyopathy: Secondary | ICD-10-CM

## 2019-03-28 NOTE — Progress Notes (Signed)
Remote ICD transmission.   

## 2019-04-02 ENCOUNTER — Ambulatory Visit (HOSPITAL_COMMUNITY): Payer: Medicare Other

## 2019-05-07 LAB — IFOBT (OCCULT BLOOD): IFOBT: POSITIVE

## 2019-05-12 LAB — HEMOCCULT SLIDES (X 3 CARDS)

## 2019-05-20 ENCOUNTER — Encounter: Payer: Self-pay | Admitting: Physician Assistant

## 2019-05-26 ENCOUNTER — Other Ambulatory Visit: Payer: Self-pay

## 2019-05-26 ENCOUNTER — Ambulatory Visit: Payer: Medicare Other | Admitting: Internal Medicine

## 2019-05-26 ENCOUNTER — Encounter: Payer: Self-pay | Admitting: Internal Medicine

## 2019-05-26 VITALS — BP 120/76 | HR 60 | Ht 76.0 in | Wt 217.8 lb

## 2019-05-26 DIAGNOSIS — I255 Ischemic cardiomyopathy: Secondary | ICD-10-CM | POA: Diagnosis not present

## 2019-05-26 DIAGNOSIS — Z95810 Presence of automatic (implantable) cardiac defibrillator: Secondary | ICD-10-CM | POA: Diagnosis not present

## 2019-05-26 DIAGNOSIS — I472 Ventricular tachycardia, unspecified: Secondary | ICD-10-CM

## 2019-05-26 DIAGNOSIS — I5022 Chronic systolic (congestive) heart failure: Secondary | ICD-10-CM

## 2019-05-26 DIAGNOSIS — I480 Paroxysmal atrial fibrillation: Secondary | ICD-10-CM | POA: Diagnosis not present

## 2019-05-26 MED ORDER — NITROGLYCERIN 0.4 MG SL SUBL: 0.4000 mg | SUBLINGUAL_TABLET | SUBLINGUAL | 6 refills | Status: AC | PRN

## 2019-05-26 NOTE — Progress Notes (Signed)
Patient Care Team: Marton Redwood, MD as PCP - General (Internal Medicine) Deboraha Sprang, MD as Consulting Physician (Cardiology)   HPI  [name] is a 63 y.o. male Seen in followup for congestive heart failure in the setting ischemic cardiomyopathy. with history of congestive heart failure secondary to ischemic cardiomyopathy with an EF of 20%. He is status post CRT-D implantation and underwent device generator replacement July 2011.  He has atrial fibrillation-persistent for which he takes amiodarone; he is not anticoagulated --stopped by Dr. Reine Just 6/20 secondary to cost issues.  Aspirin resumed.  Cardioverted 11/18 after persistent atrial fibrillation for months.  Has ventricular tachycardia for which he is also taking his amiodarone  He is status post multiple myocardial infarctions and previous stents. He has a biventricular ICD in place. He under derwent device generator replacement in July 2011.    DATE TEST EF   2011 Myoview  17 %   5/14 Echo   20 %   12/18 Echo  20-25%   1/19 R/LHC  CAD nonobstruc   Date Cr K Hgb TSH LFT  12/18 1.02 3.8 15.6 4.028 18    6/20 1.19 4.0   3.46 35     The patient denies chest pain,  nocturnal dyspnea, orthopnea or peripheral edema.  There have been no palpitations, lightheadedness or syncope.   Chronic SOB and fatigue     Past Medical History:  Diagnosis Date  . CAD (coronary artery disease) last 2005   multiple MIs  . CHF (congestive heart failure) (HCC)    secondary to severe ischemic cardiomyopathy.   a.    EF 20% with akinesis of the distal half to two-thirds of the ventricle.  Trivial MR   b.     Status post bi-V ICD.   c.     Cardiopulmonary exercise test (12/11) pVO2 of 24.5,VE/VCO2 slope is 31,.7  . Colon polyps   . Diabetes mellitus   . Diverticulosis   . GERD (gastroesophageal reflux disease)   . Headache(784.0)   . Hemorrhoids   . Hyperlipidemia   . ICD (implantable cardiac defibrillator) in place 10/2009   shocks due to  sinus tac  . Insomnia   . Left bundle branch block   . Persistent atrial fibrillation ([place])   . Prostate neoplasm   . Tinnitus    comes and goes    Past Surgical History:  Procedure Laterality Date  . CARDIAC CATHETERIZATION  2005   stent x 1 placed  . CARDIOVERSION N/A 06/06/2017   Procedure: CARDIOVERSION;  Surgeon: Larey Dresser, MD;  Location: [facility] ENDOSCOPY;  Service: Cardiovascular;  Laterality: N/A;  . colonscopy     x 2  . dual chamber defibrillator  06/15/2004  . HEMORRHOID BANDING  09-2012  . RIGHT/LEFT HEART CATH AND CORONARY ANGIOGRAPHY N/A 07/20/2017   Procedure: RIGHT/LEFT HEART CATH AND CORONARY ANGIOGRAPHY;  Surgeon: Jolaine Artist, MD;  Location: [facility];  Service: Cardiovascular;  Laterality: N/A;  . single-chamber ICD implantation  08/10/2003  . TONSILLECTOMY  age 49    Current Outpatient Medications  Medication Sig Dispense Refill  . acetaminophen (TYLENOL) 500 MG tablet Take 1,000 mg by mouth daily as needed for moderate pain.    Marland Kitchen amiodarone (PACERONE) 100 MG tablet Take 1 tablet (100 mg total) by mouth daily. 90 tablet 3  . aspirin EC 81 MG tablet Take 1 tablet (81 mg total) by mouth daily. 90 tablet 3  . esomeprazole (NEXIUM) 20 MG capsule Take  20 mg by mouth daily. 0.5 tab (10 mg) daily    . furosemide (LASIX) 40 MG tablet Take 1 tablet (40 mg total) by mouth every morning. Please keep upcoming appt with Dr. Caryl Comes in November before anymore refills. Thank you 90 tablet 0  . glimepiride (AMARYL) 4 MG tablet Take 4 mg by mouth every evening.     . metoprolol succinate (TOPROL-XL) 100 MG 24 hr tablet TAKE 1 TABLET BY MOUTH ONCE DAILY WITH FOOD OR IMMEDIATELY FOLLOWING A MEAL 90 tablet 0  . nitroGLYCERIN (NITROSTAT) 0.4 MG SL tablet Place 1 tablet (0.4 mg total) under the tongue every 5 (five) minutes as needed for chest pain. 25 tablet 6  . ramipril (ALTACE) 10 MG capsule Take 1 capsule (10 mg total) by mouth daily. 90 capsule 3  .  spironolactone (ALDACTONE) 25 MG tablet TAKE 1 TABLET BY MOUTH ONCE DAILY IN THE MORNING. Please keep upcoming appt in November with Dr. Caryl Comes before anymore refills. Thank you 90 tablet 0  . zolpidem (AMBIEN CR) 12.5 MG CR tablet Take 12.5 mg by mouth at bedtime.      No current facility-administered medications for this visit.   Reviewed  Allergies  Allergen Reactions  . Coreg [Carvedilol] Diarrhea  . Entresto [Sacubitril-Valsartan] Other (See Comments)    Joint pain  . Jardiance [Empagliflozin] Other (See Comments)    Possible afib, definitely cost  . Lipitor [Atorvastatin] Diarrhea    Review of Systems negative except from HPI and PMH  Physical Exam BP 120/76   Pulse 60   Ht 6\' 4"  (1.93 m)   Wt 217 lb 12.8 oz (98.8 kg)   SpO2 98%   BMI 26.51 kg/m  .Well developed and well nourished in no acute distress HENT normal Neck supple with JVP-flat Clear Device pocket well healed; without hematoma or erythema.  There is no tethering  Regular rate and rhythm, no   murmur Abd-soft with active BS No Clubbing cyanosis   edema Skin-warm and dry A & Oriented  Grossly normal sensory and motor function  ECG AV pacing letter no     Assessment and  Plan  Ischemic cardiomyopathy  High Risk Medication Surveillance  Congestive heart failure-chronic-systolic     Implantable defibrillator-CRT  The patient's device was interrogated.  The information was reviewed. No changes were made in the programming.    Ventricular tachycardia  Atrial fibrillation   Poor sleep hygiene  No interval atrial fibrillation.  Continue on amiodarone and apixaban.  Will be held for his catheterization  No interval ventricular tachycardia.  Amiodarone is being weaned.  We will continue with 200 mg daily.  Euvolemic continue current meds  Without symptoms of ischemia  We had a lengthy discussion regarding sleep hygiene.  We talked about the use of noise makers.  We talked about the importance of not  having abrupt changes in sound.  He will work on these things.  More than 50% of 45 min was spent in counseling related to the above   Patient Care Team: Marton Redwood, MD as PCP - General (Internal Medicine) Deboraha Sprang, MD as Consulting Physician (Cardiology)   HPI  [name] is a 63 y.o. male Seen in followup for congestive heart failure in the setting ischemic cardiomyopathy. with history of congestive heart failure secondary to ischemic cardiomyopathy with an EF of 20%. He is status post CRT-D implantation and underwent device generator replacement July 2011.  He is status post multiple myocardial infarctions and previous stents.  He has a biventricular ICD in place. He under derwent device generator replacement in July 2011.  Chronically  shortness of breath no edema or chest pain   Myoview 2011 which showed EF 17% with markedly dilated LV EDV: 509 ml ESV: 422 ml. Extensive scar with trivial peri-infarct ischemia. CPX 12/11 pVO2 24.5 slope 31.7 RER 1.12 O2 pulse 105% echocardiogram 5/14 demonstrated LVEF 20% with mild LAE RAE  DATE TEST EF   2011 Myoview  17 %   5/14 Echo   20 %   12/18 Echo  20-25%   1/19 R/LHC  CAD nonobstruc   Date Cr K Hgb TSH LFT  12/18 1.02 3.8 15.6 4.028 18                 Had recurrent A. fib and underwent DC cardioversion 11/28.  Subsequently developed tachycardia was found to be in VT and treated with amiodarone.   No chest pain;  No edema  Chronic short of breath and fatigue.  His biggest issue is sleep.  He takes multiple medications as well as   a pint of bourbon    Past Medical History:  Diagnosis Date  . CAD (coronary artery disease) last 2005   multiple MIs  . CHF (congestive heart failure) (HCC)    secondary to severe ischemic cardiomyopathy.   a.    EF 20% with akinesis of the distal half to two-thirds of the ventricle.  Trivial MR   b.     Status post bi-V ICD.   c.     Cardiopulmonary exercise test (12/11) pVO2 of  24.5,VE/VCO2 slope is 31,.7  . Colon polyps   . Diabetes mellitus   . Diverticulosis   . GERD (gastroesophageal reflux disease)   . Headache(784.0)   . Hemorrhoids   . Hyperlipidemia   . 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 Insomnia   . Left bundle branch block   . Persistent atrial fibrillation ([place])   . Prostate neoplasm   . Tinnitus    comes and goes    Past Surgical History:  Procedure Laterality Date  . CARDIAC CATHETERIZATION  2005   stent x 1 placed  . CARDIOVERSION N/A 06/06/2017   Procedure: CARDIOVERSION;  Surgeon: Larey Dresser, MD;  Location: [facility] ENDOSCOPY;  Service: Cardiovascular;  Laterality: N/A;  . colonscopy     x 2  . dual chamber defibrillator  06/15/2004  . HEMORRHOID BANDING  09-2012  . RIGHT/LEFT HEART CATH AND CORONARY ANGIOGRAPHY N/A 07/20/2017   Procedure: RIGHT/LEFT HEART CATH AND CORONARY ANGIOGRAPHY;  Surgeon: Jolaine Artist, MD;  Location: [place] [facility];  Service: Cardiovascular;  Laterality: N/A;  . single-chamber ICD implantation  08/10/2003  . TONSILLECTOMY  age 13    Current Outpatient Medications  Medication Sig Dispense Refill  . acetaminophen (TYLENOL) 500 MG tablet Take 1,000 mg by mouth daily as needed for moderate pain.    Marland Kitchen amiodarone (PACERONE) 100 MG tablet Take 1 tablet (100 mg total) by mouth daily. 90 tablet 3  . aspirin EC 81 MG tablet Take 1 tablet (81 mg total) by mouth daily. 90 tablet 3  . esomeprazole (NEXIUM) 20 MG capsule Take 20 mg by mouth daily. 0.5 tab (10 mg) daily    . furosemide (LASIX) 40 MG tablet Take 1 tablet (40 mg total) by mouth every morning. Please keep upcoming appt with Dr. Caryl Comes in November before anymore refills. Thank you 90  tablet 0  . glimepiride (AMARYL) 4 MG tablet Take 4 mg by mouth every evening.     . metoprolol succinate (TOPROL-XL) 100 MG 24 hr tablet TAKE 1 TABLET BY MOUTH ONCE DAILY WITH FOOD OR IMMEDIATELY FOLLOWING A MEAL 90 tablet  0  . nitroGLYCERIN (NITROSTAT) 0.4 MG SL tablet Place 1 tablet (0.4 mg total) under the tongue every 5 (five) minutes as needed for chest pain. 25 tablet 6  . ramipril (ALTACE) 10 MG capsule Take 1 capsule (10 mg total) by mouth daily. 90 capsule 3  . spironolactone (ALDACTONE) 25 MG tablet TAKE 1 TABLET BY MOUTH ONCE DAILY IN THE MORNING. Please keep upcoming appt in November with Dr. Caryl Comes before anymore refills. Thank you 90 tablet 0  . zolpidem (AMBIEN CR) 12.5 MG CR tablet Take 12.5 mg by mouth at bedtime.      No current facility-administered medications for this visit.   Reviewed  Allergies  Allergen Reactions  . Coreg [Carvedilol] Diarrhea  . Entresto [Sacubitril-Valsartan] Other (See Comments)    Joint pain  . Jardiance [Empagliflozin] Other (See Comments)    Possible afib, definitely cost  . Lipitor [Atorvastatin] Diarrhea    Review of Systems negative except from HPI and PMH  Physical Exam BP 120/76   Pulse 60   Ht 6\' 4"  (1.93 m)   Wt 217 lb 12.8 oz (98.8 kg)   SpO2 98%   BMI 26.51 kg/m  Well developed and well nourished in no acute distress HENT normal Neck supple with JVP-flat Clear Device pocket well healed; without hematoma or erythema.  There is no tethering  Regular rate and rhythm, no  murmur Abd-soft with active BS No Clubbing cyanosis   edema Skin-warm and dry A & Oriented  Grossly normal sensory and motor function    Assessment and  Plan  Ischemic cardiomyopathy  High Risk Medication Surveillance  Congestive heart failure-chronic-systolic     Implantable defibrillator-CRT  The patient's device was interrogated.  The information was reviewed. No changes were made in the programming.        Ventricular tachycardia  Atrial fibrillation   Poor sleep hygiene   No interval atrial fibrillation we reviewed the data and the benefits of Eliquis versus aspirin.  For him it was cost.  May be in the new year he will be able to afford it as he will be  out of the donut hole.  He continues on amiodarone.  Tolerated it from a symptom point of view surveillance laboratories were in range and no June  No interval ventricular tachycardia  Without symptoms of ischemia  We spent more than 50% of our >25 min visit in face to face counseling regarding the above

## 2019-05-26 NOTE — Patient Instructions (Signed)
Medication Instructions:  The current medical regimen is effective;  continue present plan and medications.  *If you need a refill on your cardiac medications before your next appointment, please call your pharmacy*  Follow-Up: At [facility], you and your health needs are our priority.  As part of our continuing mission to provide you with exceptional heart care, we have created designated Provider Care Teams.  These Care Teams include your primary Cardiologist (physician) and Advanced Practice Providers (APPs -  Physician Assistants and Nurse Practitioners) who all work together to provide you with the care you need, when you need it.  Your next appointment:   12 months  The format for your next appointment:   In Person  Provider:   [name], MD  Thank you for choosing [facility]!!

## 2019-05-28 ENCOUNTER — Encounter: Payer: Self-pay | Admitting: Physician Assistant

## 2019-05-28 ENCOUNTER — Ambulatory Visit: Payer: Medicare Other | Admitting: Physician Assistant

## 2019-05-28 VITALS — Ht 76.0 in | Wt 215.0 lb

## 2019-05-28 DIAGNOSIS — I5023 Acute on chronic systolic (congestive) heart failure: Secondary | ICD-10-CM | POA: Diagnosis not present

## 2019-05-28 DIAGNOSIS — I255 Ischemic cardiomyopathy: Secondary | ICD-10-CM

## 2019-05-28 DIAGNOSIS — Z86010 Personal history of colonic polyps: Secondary | ICD-10-CM | POA: Diagnosis not present

## 2019-05-28 DIAGNOSIS — R195 Other fecal abnormalities: Secondary | ICD-10-CM

## 2019-05-28 DIAGNOSIS — K5900 Constipation, unspecified: Secondary | ICD-10-CM

## 2019-05-28 MED ORDER — NA SULFATE-K SULFATE-MG SULF 17.5-3.13-1.6 GM/177ML PO SOLN: 1.0000 | Freq: Once | ORAL | 0 refills | Status: AC

## 2019-05-28 NOTE — H&P (View-Only) (Signed)
Reviewed.  High risk patient being set up for colonoscopy at the hospital to evaluate Hemoccult positive stool and a prior history of adenomatous polyps overdue for surveillance

## 2019-05-28 NOTE — Progress Notes (Signed)
Reviewed.  High risk patient being set up for colonoscopy at the hospital to evaluate Hemoccult positive stool and a prior history of adenomatous polyps overdue for surveillance

## 2019-05-28 NOTE — Patient Instructions (Addendum)
If you are age 64 or older, your body mass index should be between 23-30. Your Body mass index is 26.17 kg/m. If this is out of the aforementioned range listed, please consider follow up with your Primary Care Provider.  If you are age 89 or younger, your body mass index should be between 19-25. Your Body mass index is 26.17 kg/m. If this is out of the aformentioned range listed, please consider follow up with your Primary Care Provider.    You have been scheduled for a colonoscopy. Please follow written instructions given to you at your visit today.  Please pick up your prep supplies at the pharmacy within the next 1-3 days. If you use inhalers (even only as needed), please bring them with you on the day of your procedure.

## 2019-05-28 NOTE — Progress Notes (Signed)
Chief Complaint: Positive Hemoccult  HPI:    [name] is a 63 year old Caucasian male with a past medical history of CAD, CHF (07/20/2017 EF 15-20%), diabetes, status post ICD, A. fib and others listed below, known to Dr. Henrene Pastor, who was referred to me by Marton Redwood, MD for a complaint of Hemoccult positive stool.      03/10/2014 seen in clinic by Dr. Henrene Pastor to discuss history of polyps.  Last colonoscopy noted 12/12/2006 with a diminutive right-sided colon polyp found to be a tubular adenoma.  Repeat was recommended in 5 years.  At that time patient had been scheduled in the hospital given his severe CHF with ejection fraction less than 20% with ICD.  (It does not appear this was ever completed)    05/19/2019 + Hemoccult x3.  05/19/2019 Hemosure positive.      Today, the patient presents to clinic and explains that he recently saw his PCP who told him that he had a positive Hemoccult and needed a colonoscopy.  Explained that he never had his last one done because he could not get a ride.  He did not want to pay a service to sit outside and wait for him and didn't want them to be "in control of me".  Explains that he now has a ride but his procedure needs to be on a Monday.  Does tell me that he previously had diarrhea 6-8 times a day and one of his medications was changed and now he has bowel movement twice a day which is somewhat hard to pass.  Describes straining.    Patient does express a few other complaints including problems with medication pricing.  Tells me currently he is off of anticoagulation because it cost too much.    Denies fever, chills, weight loss, seeing blood in his stool, heartburn or reflux.     Past Medical History:  Diagnosis Date  . CAD (coronary artery disease) last 2005   multiple MIs  . CHF (congestive heart failure) (HCC)    secondary to severe ischemic cardiomyopathy.   a.    EF 20% with akinesis of the distal half to two-thirds of the ventricle.  Trivial MR   b.      Status post bi-V ICD.   c.     Cardiopulmonary exercise test (12/11) pVO2 of 24.5,VE/VCO2 slope is 31,.7  . Colon polyps   . Diabetes mellitus   . Diverticulosis   . GERD (gastroesophageal reflux disease)   . Headache(784.0)   . Hemorrhoids   . Hyperlipidemia   . 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 Insomnia   . Left bundle branch block   . Persistent atrial fibrillation ([place])   . Prostate neoplasm   . Tinnitus    comes and goes    Past Surgical History:  Procedure Laterality Date  . CARDIAC CATHETERIZATION  2005   stent x 1 placed  . CARDIOVERSION N/A 06/06/2017   Procedure: CARDIOVERSION;  Surgeon: Larey Dresser, MD;  Location: [facility] ENDOSCOPY;  Service: Cardiovascular;  Laterality: N/A;  . colonscopy     x 2  . dual chamber defibrillator  06/15/2004  . HEMORRHOID BANDING  09-2012  . RIGHT/LEFT HEART CATH AND CORONARY ANGIOGRAPHY N/A 07/20/2017   Procedure: RIGHT/LEFT HEART CATH AND CORONARY ANGIOGRAPHY;  Surgeon: Jolaine Artist, MD;  Location: [place] CV LAB;  Service: Cardiovascular;  Laterality: N/A;  . single-chamber ICD implantation  08/10/2003  .  TONSILLECTOMY  age 81    Current Outpatient Medications  Medication Sig Dispense Refill  . acetaminophen (TYLENOL) 500 MG tablet Take 1,000 mg by mouth daily as needed for moderate pain.    Marland Kitchen amiodarone (PACERONE) 100 MG tablet Take 1 tablet (100 mg total) by mouth daily. 90 tablet 3  . aspirin EC 81 MG tablet Take 1 tablet (81 mg total) by mouth daily. 90 tablet 3  . esomeprazole (NEXIUM) 20 MG capsule Take 20 mg by mouth daily. 0.5 tab (10 mg) daily    . furosemide (LASIX) 40 MG tablet Take 1 tablet (40 mg total) by mouth every morning. Please keep upcoming appt with Dr. Caryl Comes in November before anymore refills. Thank you 90 tablet 0  . glimepiride (AMARYL) 4 MG tablet Take 4 mg by mouth every evening.     . metoprolol succinate (TOPROL-XL) 100 MG 24 hr tablet TAKE 1  TABLET BY MOUTH ONCE DAILY WITH FOOD OR IMMEDIATELY FOLLOWING A MEAL 90 tablet 0  . nitroGLYCERIN (NITROSTAT) 0.4 MG SL tablet Place 1 tablet (0.4 mg total) under the tongue every 5 (five) minutes as needed for chest pain. 25 tablet 6  . ramipril (ALTACE) 10 MG capsule Take 1 capsule (10 mg total) by mouth daily. 90 capsule 3  . spironolactone (ALDACTONE) 25 MG tablet TAKE 1 TABLET BY MOUTH ONCE DAILY IN THE MORNING. Please keep upcoming appt in November with Dr. Caryl Comes before anymore refills. Thank you 90 tablet 0  . zolpidem (AMBIEN CR) 12.5 MG CR tablet Take 12.5 mg by mouth at bedtime.      No current facility-administered medications for this visit.     Allergies as of 05/28/2019 - Review Complete 05/26/2019  Allergen Reaction Noted  . Coreg [carvedilol] Diarrhea 06/06/2016  . Entresto [sacubitril-valsartan] Other (See Comments) 06/06/2016  . Jardiance [empagliflozin] Other (See Comments) 06/07/2017  . Lipitor [atorvastatin] Diarrhea 06/06/2016    Family History  Problem Relation Age of Onset  . Coronary artery disease Unknown     Social History   Socioeconomic History  . Marital status: Single    Spouse name: Not on file  . Number of children: Not on file  . Years of education: Not on file  . Highest education level: Not on file  Occupational History  . Not on file  Social Needs  . Financial resource strain: Not on file  . Food insecurity    Worry: Not on file    Inability: Not on file  . Transportation needs    Medical: Not on file    Non-medical: Not on file  Tobacco Use  . Smoking status: Former Smoker    Packs/day: 2.00    Years: 30.00    Pack years: 60.00    Types: Cigarettes    Quit date: 07/20/2003    Years since quitting: 15.8  . Smokeless tobacco: Never Used  Substance and Sexual Activity  . Alcohol use: Yes    Comment: Occasional.  . 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 Sexual activity: Not on file   Lifestyle  . Physical activity    Days per week: Not on file    Minutes per session: Not on file  . Stress: Not on file  Relationships  . Social Herbalist on phone: Not on file    Gets together: Not on file    Attends religious service: Not on file  Active member of club or organization: Not on file    Attends meetings of clubs or organizations: Not on file    Relationship status: Not on file  . Intimate partner violence    Fear of current or ex partner: Not on file    Emotionally abused: Not on file    Physically abused: Not on file    Forced sexual activity: Not on file  Other Topics Concern  . Not on file  Social History Narrative  . Not on file    Review of Systems:    Constitutional: No weight loss, fever or chills Skin: No rash Cardiovascular: No chest pain Respiratory: No SOB  Gastrointestinal: See HPI and otherwise negative Genitourinary: No dysuria  Neurological: No headache, dizziness or syncope Musculoskeletal: No new muscle or joint pain Hematologic: No bleeding  Psychiatric: No history of depression or anxiety   Physical Exam:  Vital signs: Ht 6\' 4"  (1.93 m)   Wt 215 lb (97.5 kg)   BMI 26.17 kg/m   Constitutional:   Pleasant Caucasian male appears to be in NAD, Well developed, Well nourished, alert and cooperative Head:  Normocephalic and atraumatic. Eyes:   PEERL, EOMI. No icterus. Conjunctiva pink. Ears:  Normal auditory acuity. Neck:  Supple Throat: Oral cavity and pharynx without inflammation, swelling or lesion.  Respiratory: Respirations even and unlabored. Lungs clear to auscultation bilaterally.   No wheezes, crackles, or rhonchi.  Cardiovascular: Normal S1, S2. No MRG. Regular rate and rhythm. No peripheral edema, cyanosis or pallor.  Gastrointestinal:  Soft, nondistended, nontender. No rebound or guarding. Normal bowel sounds. No appreciable masses or hepatomegaly. Rectal:  Not performed.  Msk:  Symmetrical without gross  deformities. Without edema, no deformity or joint abnormality.  Neurologic:  Alert and  oriented x4;  grossly normal neurologically.  Skin:   Dry and intact without significant lesions or rashes. Psychiatric: Demonstrates good judgement and reason without abnormal affect or behaviors.  See HPI for recent labs.  Assessment: 1.  Positive Hemoccult: x 3 on 05/19/19; consider polyps versus CRC versus hemorrhoids versus other 2.  History of adenomatous polyps: Last colonoscopy in 2008, apparently had a Cologuard in 2018 which was negative 3.  Multiple comorbidities including CHF with EF less than 15% 4.  Constipation: Over the past few months  Plan: 1.  Due to multiple comorbidities patient is at high risk for this exam.  This will be scheduled in the hospital with Dr. Henrene Pastor.  Did discuss risk, benefits, limitations and alternatives and the patient agrees to proceed with colonoscopy.  This was scheduled on a Monday per patient request. 2.  Discussed constipation briefly, explained that patient can try over-the-counter MiraLAX daily and/or a fiber supplement. 3.  Patient to follow in clinic per recommendations from Dr. Henrene Pastor after time of procedure.  Ellouise Newer, PA-C [facility] 05/28/2019, 11:28 AM  Cc: Marton Redwood, MD

## 2019-05-29 ENCOUNTER — Other Ambulatory Visit (HOSPITAL_COMMUNITY)
Admission: RE | Admit: 2019-05-29 | Discharge: 2019-05-29 | Disposition: A | Discharge status: Home / Self Care | Payer: Medicare Other | Source: Ambulatory Visit | Attending: Internal Medicine | Admitting: Internal Medicine

## 2019-05-29 DIAGNOSIS — Z01812 Encounter for preprocedural laboratory examination: Secondary | ICD-10-CM | POA: Insufficient documentation

## 2019-05-29 DIAGNOSIS — Z20828 Contact with and (suspected) exposure to other viral communicable diseases: Secondary | ICD-10-CM | POA: Diagnosis not present

## 2019-05-30 ENCOUNTER — Encounter (HOSPITAL_COMMUNITY): Payer: Self-pay | Admitting: *Deleted

## 2019-05-30 ENCOUNTER — Other Ambulatory Visit: Payer: Self-pay

## 2019-05-30 LAB — NOVEL CORONAVIRUS, NAA (HOSP ORDER, SEND-OUT TO REF LAB; TAT 18-24 HRS): SARS-CoV-2, NAA: NOT DETECTED

## 2019-05-30 NOTE — Progress Notes (Signed)
Pre -op call for procedure on Monday 11/23. Patient states they will remain in quarantine over the weekend. All questions addressed.

## 2019-06-01 ENCOUNTER — Other Ambulatory Visit: Payer: Self-pay | Admitting: Internal Medicine

## 2019-06-02 ENCOUNTER — Encounter (HOSPITAL_COMMUNITY): Payer: Self-pay | Admitting: *Deleted

## 2019-06-02 ENCOUNTER — Other Ambulatory Visit: Payer: Self-pay

## 2019-06-02 ENCOUNTER — Encounter (HOSPITAL_COMMUNITY)
Admission: RE | Disposition: A | Discharge status: Home / Self Care | Payer: Self-pay | Source: Home / Self Care | Attending: Internal Medicine

## 2019-06-02 ENCOUNTER — Ambulatory Visit (HOSPITAL_COMMUNITY): Payer: Medicare Other | Admitting: Certified Registered"

## 2019-06-02 ENCOUNTER — Ambulatory Visit (HOSPITAL_COMMUNITY)
Admission: RE | Admit: 2019-06-02 | Discharge: 2019-06-02 | Disposition: A | Discharge status: Home / Self Care | Payer: Medicare Other | Attending: Internal Medicine | Admitting: Internal Medicine

## 2019-06-02 DIAGNOSIS — K648 Other hemorrhoids: Secondary | ICD-10-CM | POA: Diagnosis not present

## 2019-06-02 DIAGNOSIS — I509 Heart failure, unspecified: Secondary | ICD-10-CM | POA: Insufficient documentation

## 2019-06-02 DIAGNOSIS — R195 Other fecal abnormalities: Secondary | ICD-10-CM | POA: Insufficient documentation

## 2019-06-02 DIAGNOSIS — K644 Residual hemorrhoidal skin tags: Secondary | ICD-10-CM | POA: Diagnosis not present

## 2019-06-02 DIAGNOSIS — K635 Polyp of colon: Secondary | ICD-10-CM

## 2019-06-02 DIAGNOSIS — Z95810 Presence of automatic (implantable) cardiac defibrillator: Secondary | ICD-10-CM | POA: Insufficient documentation

## 2019-06-02 DIAGNOSIS — E119 Type 2 diabetes mellitus without complications: Secondary | ICD-10-CM | POA: Insufficient documentation

## 2019-06-02 DIAGNOSIS — D123 Benign neoplasm of transverse colon: Secondary | ICD-10-CM

## 2019-06-02 DIAGNOSIS — D124 Benign neoplasm of descending colon: Secondary | ICD-10-CM | POA: Insufficient documentation

## 2019-06-02 DIAGNOSIS — K219 Gastro-esophageal reflux disease without esophagitis: Secondary | ICD-10-CM | POA: Insufficient documentation

## 2019-06-02 DIAGNOSIS — D122 Benign neoplasm of ascending colon: Secondary | ICD-10-CM | POA: Diagnosis not present

## 2019-06-02 DIAGNOSIS — Z87891 Personal history of nicotine dependence: Secondary | ICD-10-CM | POA: Insufficient documentation

## 2019-06-02 DIAGNOSIS — I110 Hypertensive heart disease with heart failure: Secondary | ICD-10-CM | POA: Diagnosis not present

## 2019-06-02 DIAGNOSIS — Z79899 Other long term (current) drug therapy: Secondary | ICD-10-CM | POA: Insufficient documentation

## 2019-06-02 DIAGNOSIS — I4819 Other persistent atrial fibrillation: Secondary | ICD-10-CM | POA: Insufficient documentation

## 2019-06-02 DIAGNOSIS — Z7982 Long term (current) use of aspirin: Secondary | ICD-10-CM | POA: Insufficient documentation

## 2019-06-02 DIAGNOSIS — K5730 Diverticulosis of large intestine without perforation or abscess without bleeding: Secondary | ICD-10-CM | POA: Insufficient documentation

## 2019-06-02 DIAGNOSIS — E785 Hyperlipidemia, unspecified: Secondary | ICD-10-CM | POA: Insufficient documentation

## 2019-06-02 HISTORY — PX: POLYPECTOMY: SHX5525

## 2019-06-02 HISTORY — DX: Presence of cardiac pacemaker: Z95.0

## 2019-06-02 HISTORY — DX: Presence of automatic (implantable) cardiac defibrillator: Z95.810

## 2019-06-02 HISTORY — PX: COLONOSCOPY WITH PROPOFOL: SHX5780

## 2019-06-02 LAB — GLUCOSE, CAPILLARY: Glucose-Capillary: 141 mg/dL — ABNORMAL HIGH (ref 70–99)

## 2019-06-02 SURGERY — COLONOSCOPY WITH PROPOFOL
Anesthesia: Monitor Anesthesia Care

## 2019-06-02 MED ORDER — PROPOFOL 10 MG/ML IV BOLUS
INTRAVENOUS | Status: DC | PRN
  Administered 2019-06-02: 10 mg via INTRAVENOUS
  Administered 2019-06-02: 5 mg via INTRAVENOUS
  Administered 2019-06-02: 10 mg via INTRAVENOUS
  Administered 2019-06-02: 5 mg via INTRAVENOUS
  Administered 2019-06-02: 10 mg via INTRAVENOUS

## 2019-06-02 MED ORDER — LACTATED RINGERS IV SOLN
INTRAVENOUS | Status: DC
  Administered 2019-06-02: 1000 mL via INTRAVENOUS

## 2019-06-02 MED ORDER — PROPOFOL 500 MG/50ML IV EMUL
INTRAVENOUS | Status: DC | PRN
  Administered 2019-06-02: 110 ug/kg/min via INTRAVENOUS
  Administered 2019-06-02: 100 ug/kg/min via INTRAVENOUS
  Administered 2019-06-02: 75 ug/kg/min via INTRAVENOUS

## 2019-06-02 MED ORDER — SODIUM CHLORIDE 0.9 % IV SOLN: INTRAVENOUS | Status: DC

## 2019-06-02 MED ORDER — PROPOFOL 500 MG/50ML IV EMUL
INTRAVENOUS | Status: AC
  Filled 2019-06-02: qty 50

## 2019-06-02 SURGICAL SUPPLY — 22 items

## 2019-06-02 NOTE — Anesthesia Postprocedure Evaluation (Signed)
Anesthesia Post Note  Patient: [name]  Procedure(s) Performed: COLONOSCOPY WITH PROPOFOL (N/A ) POLYPECTOMY     Patient location during evaluation: PACU Anesthesia Type: MAC Level of consciousness: awake and alert Pain management: pain level controlled Vital Signs Assessment: post-procedure vital signs reviewed and stable Respiratory status: spontaneous breathing, nonlabored ventilation, respiratory function stable and patient connected to nasal cannula oxygen Cardiovascular status: stable and blood pressure returned to baseline Postop Assessment: no apparent nausea or vomiting Anesthetic complications: no    Last Vitals:  Vitals:   06/02/19 0958 06/02/19 1006  BP: (!) 106/58 122/71  Pulse: 68 64  Resp: 19 19  Temp: 36.9 C   SpO2: 100% 98%    Last Pain:  Vitals:   06/02/19 1006  TempSrc:   PainSc: 0-No pain                 Karmina Zufall

## 2019-06-02 NOTE — Interval H&P Note (Signed)
History and Physical Interval Note:  06/02/2019 9:07 AM  [name]  has presented today for surgery, with the diagnosis of positive hemoccult.  The various methods of treatment have been discussed with the patient and family. After consideration of risks, benefits and other options for treatment, the patient has consented to  Procedure(s): COLONOSCOPY WITH PROPOFOL (N/A) as a surgical intervention.  The patient's history has been reviewed, patient examined, no change in status, stable for surgery.  I have reviewed the patient's chart and labs.  Questions were answered to the patient's satisfaction.     Scarlette Shorts

## 2019-06-02 NOTE — Op Note (Signed)
[facility] Patient Name: [name] Procedure Date: 06/02/2019 MRN: [id number] Attending MD: Docia Chuck. Henrene Pastor , MD Date of Birth: September 26, 1955 CSN: [id number] Age: 63 Admit Type: Outpatient Procedure:                Colonoscopy with cold snare polypectomy x 7 Indications:              Heme positive stool. Remote colonoscopy June 2008                            with small tubular adenoma Providers:                Docia Chuck. Henrene Pastor, MD, Grace Isaac, RN, Benay Pillow,                            RN, William Dalton, Technician Referring MD:             Carmie Kanner, MD Medicines:                Monitored Anesthesia Care Complications:            No immediate complications. Estimated blood loss:                            None. Estimated Blood Loss:     Estimated blood loss: none. Procedure:                Pre-Anesthesia Assessment:                           - Prior to the procedure, a History and Physical                            was performed, and patient medications and                            allergies were reviewed. The patient's tolerance of                            previous anesthesia was also reviewed. The risks                            and benefits of the procedure and the sedation                            options and risks were discussed with the patient.                            All questions were answered, and informed consent                            was obtained. Prior Anticoagulants: The patient has                            taken no previous anticoagulant or antiplatelet  agents. ASA Grade Assessment: III - A patient with                            severe systemic disease. After reviewing the risks                            and benefits, the patient was deemed in                            satisfactory condition to undergo the procedure.                           After obtaining informed consent, the  colonoscope                            was passed under direct vision. Throughout the                            procedure, the patient's blood pressure, pulse, and                            oxygen saturations were monitored continuously. The                            [id number] Olympus colonoscope was                            introduced through the anus and advanced to the the                            cecum, identified by appendiceal orifice and                            ileocecal valve. The ileocecal valve, appendiceal                            orifice, and rectum were photographed. The quality                            of the bowel preparation was excellent. The                            colonoscopy was performed without difficulty. The                            patient tolerated the procedure well. The bowel                            preparation used was SUPREP via split dose                            instruction. Scope In: 9:34:45 AM Scope Out: 9:51:09 AM Scope Withdrawal Time: 0 hours 13 minutes 55 seconds  Total Procedure Duration: 0 hours 16 minutes  24 seconds  Findings:      Seven polyps were found in the descending colon, transverse colon and       ascending colon. The polyps were 3 to 7 mm in size. These polyps were       removed with a cold snare. Resection and retrieval were complete.      Multiple small-mouthed diverticula were found in the sigmoid colon.      External and internal hemorrhoids were found during retroflexion.      The exam was otherwise without abnormality on direct and retroflexion       views. Impression:               - Seven 3 to 7 mm polyps in the descending colon,                            in the transverse colon and in the ascending colon,                            removed with a cold snare. Resected and retrieved.                           - Diverticulosis in the sigmoid colon.                           - External and  internal hemorrhoids.                           - The examination was otherwise normal on direct                            and retroflexion views. Moderate Sedation:      none Recommendation:           - Repeat colonoscopy in 3 years for surveillance.                           - Patient has a contact number available for                            emergencies. The signs and symptoms of potential                            delayed complications were discussed with the                            patient. Return to normal activities tomorrow.                            Written discharge instructions were provided to the                            patient.                           - Resume previous diet.                           -  Continue present medications.                           - Await pathology results. Procedure Code(s):        --- Professional ---                           [phone], Colonoscopy, flexible; with removal of                            tumor(s), polyp(s), or other lesion(s) by snare                            technique Diagnosis Code(s):        --- Professional ---                           K63.5, Polyp of colon                           K64.8, Other hemorrhoids                           R19.5, Other fecal abnormalities                           K57.30, Diverticulosis of large intestine without                            perforation or abscess without bleeding CPT copyright 2019 American Medical Association. All rights reserved. The codes documented in this report are preliminary and upon coder review may  be revised to meet current compliance requirements. Docia Chuck. Henrene Pastor, MD 06/02/2019 9:58:44 AM This report has been signed electronically. Number of Addenda: 0

## 2019-06-02 NOTE — Anesthesia Procedure Notes (Signed)
Procedure Name: MAC Date/Time: 06/02/2019 9:10 AM Performed by: Niel Hummer, CRNA Pre-anesthesia Checklist: Patient identified, Emergency Drugs available, Suction available and Patient being monitored Patient Re-evaluated:Patient Re-evaluated prior to induction Oxygen Delivery Method: Simple face mask

## 2019-06-02 NOTE — Discharge Instructions (Signed)

## 2019-06-02 NOTE — Transfer of Care (Signed)
Immediate Anesthesia Transfer of Care Note  Patient: [name]  Procedure(s) Performed: COLONOSCOPY WITH PROPOFOL (N/A ) BIOPSY POLYPECTOMY  Patient Location: PACU  Anesthesia Type:MAC  Level of Consciousness: awake, alert  and oriented  Airway & Oxygen Therapy: Patient Spontanous Breathing and Patient connected to face mask oxygen  Post-op Assessment: Report given to RN, Post -op Vital signs reviewed and stable and Patient moving all extremities X 4  Post vital signs: Reviewed and stable  Last Vitals:  Vitals Value Taken Time  BP    Temp    Pulse    Resp    SpO2      Last Pain:  Vitals:   06/02/19 0837  TempSrc: Oral  PainSc: 0-No pain         Complications: No apparent anesthesia complications

## 2019-06-02 NOTE — Interval H&P Note (Signed)
History and Physical Interval Note:  06/02/2019 8:38 AM  [name]  has presented today for surgery, with the diagnosis of positive hemoccult.  The various methods of treatment have been discussed with the patient and family. After consideration of risks, benefits and other options for treatment, the patient has consented to  Procedure(s): COLONOSCOPY WITH PROPOFOL (N/A) as a surgical intervention.  The patient's history has been reviewed, patient examined, no change in status, stable for surgery.  I have reviewed the patient's chart and labs.  Questions were answered to the patient's satisfaction.     Scarlette Shorts

## 2019-06-02 NOTE — Anesthesia Preprocedure Evaluation (Addendum)
Anesthesia Evaluation  Patient identified by MRN, date of birth, ID band Patient awake    Reviewed: Allergy & Precautions, NPO status , Patient's Chart, lab work & pertinent test results  Airway Mallampati: II  TM Distance: >3 FB Neck ROM: Full    Dental no notable dental hx.    Pulmonary neg pulmonary ROS, former smoker,    Pulmonary exam normal breath sounds clear to auscultation       Cardiovascular hypertension, Pt. on medications and Pt. on home beta blockers + CAD and +CHF  Normal cardiovascular exam+ dysrhythmias Atrial Fibrillation + pacemaker + Cardiac Defibrillator  Rhythm:Regular Rate:Normal  CATH 19  Previously placed Prox RCA to Mid RCA stent (unknown type) is widely patent. Ost 1st Mrg lesion is 60% stenosed. 1st Mrg lesion is 40% stenosed. Mid LAD to Dist LAD lesion is 100% stenosed. Ost 2nd Diag to 2nd Diag lesion is 99% stenosed.   Findings: PA sat = 66%, 67%  Assessment: 1. 3v CAD with patent RCA stent and CTO LAD 2. ICM EF 15-20% 3. Well-compensated hemodynamics     ECHO 18  Left ventricle: The cavity size was severely dilated. Systolic   function was severely reduced. The estimated ejection fraction   was in the range of 20% to 25%. There is akinesis of the   anteroseptal, inferolateral, and apical myocardium. Acoustic   contrast opacification revealed no evidence ofthrombus. - Mitral valve: There was moderate to severe regurgitation. - Left atrium: The atrium was moderately dilated. Volume/bsa, ES   (1-plane Simpson&'s, A4C): 41.3 ml/m^2. - Right ventricle: The cavity size was moderately dilated. Wall   thickness was normal. Pacer wire or catheter noted in right   ventricle. - Pulmonary arteries: Systolic pressure was moderately increased.   PA peak pressure: 59 mm Hg (S).   Neuro/Psych negative neurological ROS  negative psych ROS   GI/Hepatic negative GI ROS, Neg liver ROS, GERD  ,   Endo/Other  negative endocrine ROSdiabetes  Renal/GU negative Renal ROS  negative genitourinary   Musculoskeletal negative musculoskeletal ROS (+)   Abdominal   Peds negative pediatric ROS (+)  Hematology negative hematology ROS (+)   Anesthesia Other Findings   Reproductive/Obstetrics negative OB ROS                            Anesthesia Physical  Anesthesia Plan  ASA: IV  Anesthesia Plan: MAC   Post-op Pain Management:    Induction: Intravenous  PONV Risk Score and Plan: 1 and Treatment may vary due to age or medical condition  Airway Management Planned: Mask, Natural Airway and Nasal Cannula  Additional Equipment:   Intra-op Plan:   Post-operative Plan:   Informed Consent:   Plan Discussed with: CRNA, Anesthesiologist and Surgeon  Anesthesia Plan Comments:         Anesthesia Quick Evaluation

## 2019-06-03 ENCOUNTER — Encounter: Payer: Self-pay | Admitting: Internal Medicine

## 2019-06-03 ENCOUNTER — Telehealth: Payer: Self-pay

## 2019-06-03 LAB — SURGICAL PATHOLOGY

## 2019-06-03 NOTE — Telephone Encounter (Signed)
Scott Shipper, MD  Algernon Huxley, RN        [facility] outpatient. Please let him know that all of his polyps were benign. He will need recall colonoscopy in 3 years. Please put recall in computer for 3 years. Convert all of this information to phone note for future reference. Thanks    Spoke with pt and he is aware, recall in epic.

## 2019-06-04 ENCOUNTER — Encounter (HOSPITAL_COMMUNITY): Payer: Self-pay | Admitting: Internal Medicine

## 2019-06-11 ENCOUNTER — Ambulatory Visit (INDEPENDENT_AMBULATORY_CARE_PROVIDER_SITE_OTHER): Payer: Medicare Other | Admitting: *Deleted

## 2019-06-11 DIAGNOSIS — I5022 Chronic systolic (congestive) heart failure: Secondary | ICD-10-CM

## 2019-06-11 LAB — CUP PACEART REMOTE DEVICE CHECK
Battery Remaining Longevity: 30 mo
Battery Remaining Percentage: 43 %
Brady Statistic RA Percent Paced: 76 %
Brady Statistic RV Percent Paced: 99 %
Date Time Interrogation Session: 20201202035100
HighPow Impedance: 66 Ohm
Implantable Lead Implant Date: 20051207
Implantable Lead Implant Date: 20051207
Implantable Lead Implant Date: 20110721
Implantable Lead Location: 753858
Implantable Lead Location: 753859
Implantable Lead Location: 753860
Implantable Lead Model: 158
Implantable Lead Model: 4543
Implantable Lead Model: 5076
Implantable Lead Serial Number: 148938
Implantable Lead Serial Number: 205859
Implantable Pulse Generator Implant Date: 20110721
Lead Channel Impedance Value: 464 Ohm
Lead Channel Impedance Value: 545 Ohm
Lead Channel Impedance Value: 898 Ohm
Lead Channel Pacing Threshold Amplitude: 0.7 V
Lead Channel Pacing Threshold Amplitude: 0.8 V
Lead Channel Pacing Threshold Amplitude: 1 V
Lead Channel Pacing Threshold Pulse Width: 0.4 ms
Lead Channel Pacing Threshold Pulse Width: 0.4 ms
Lead Channel Pacing Threshold Pulse Width: 0.4 ms
Lead Channel Setting Pacing Amplitude: 1.9 V
Lead Channel Setting Pacing Amplitude: 2 V
Lead Channel Setting Pacing Amplitude: 2.4 V
Lead Channel Setting Pacing Pulse Width: 0.4 ms
Lead Channel Setting Pacing Pulse Width: 0.4 ms
Lead Channel Setting Sensing Sensitivity: 0.5 mV
Lead Channel Setting Sensing Sensitivity: 1 mV
Pulse Gen Serial Number: 139171

## 2019-06-18 ENCOUNTER — Other Ambulatory Visit: Payer: Self-pay | Admitting: Internal Medicine

## 2019-06-18 DIAGNOSIS — I255 Ischemic cardiomyopathy: Secondary | ICD-10-CM

## 2019-07-03 LAB — CUP PACEART INCLINIC DEVICE CHECK
Brady Statistic RA Percent Paced: 84 %
Brady Statistic RV Percent Paced: 99 %
Date Time Interrogation Session: 20201116000000
HighPow Impedance: 47 Ohm
HighPow Impedance: 68 Ohm
Implantable Lead Implant Date: 20051207
Implantable Lead Implant Date: 20051207
Implantable Lead Implant Date: 20110721
Implantable Lead Location: 753858
Implantable Lead Location: 753859
Implantable Lead Location: 753860
Implantable Lead Model: 158
Implantable Lead Model: 4543
Implantable Lead Model: 5076
Implantable Lead Serial Number: 148938
Implantable Lead Serial Number: 205859
Implantable Pulse Generator Implant Date: 20110721
Lead Channel Impedance Value: 486 Ohm
Lead Channel Impedance Value: 566 Ohm
Lead Channel Impedance Value: 959 Ohm
Lead Channel Pacing Threshold Amplitude: 0.7 V
Lead Channel Pacing Threshold Amplitude: 0.8 V
Lead Channel Pacing Threshold Amplitude: 1 V
Lead Channel Pacing Threshold Pulse Width: 0.4 ms
Lead Channel Pacing Threshold Pulse Width: 0.4 ms
Lead Channel Pacing Threshold Pulse Width: 0.4 ms
Lead Channel Sensing Intrinsic Amplitude: 24.5 mV
Lead Channel Sensing Intrinsic Amplitude: 25 mV
Lead Channel Sensing Intrinsic Amplitude: 4.6 mV
Lead Channel Setting Pacing Amplitude: 1.9 V
Lead Channel Setting Pacing Amplitude: 2 V
Lead Channel Setting Pacing Amplitude: 2.4 V
Lead Channel Setting Pacing Pulse Width: 0.4 ms
Lead Channel Setting Pacing Pulse Width: 0.4 ms
Lead Channel Setting Sensing Sensitivity: 0.5 mV
Lead Channel Setting Sensing Sensitivity: 1 mV
Pulse Gen Serial Number: 139171

## 2019-07-07 NOTE — Progress Notes (Signed)
ICD remote 

## 2019-07-10 IMAGING — CT CT CHEST LUNG CANCER SCREENING LOW DOSE W/O CM
1 series · 15 of 31 positions shown, 19 images · non-contrast
Comparison: 01/26/2017 screening chest CT. 06/07/2017 chest
radiograph.

CLINICAL DATA: 61-year-old asymptomatic male former smoker with 151
pack year smoking history.

EXAM:
CT CHEST WITHOUT CONTRAST LOW-DOSE FOR LUNG CANCER SCREENING
TECHNIQUE: Multidetector CT imaging of the chest was performed following the
standard protocol without IV contrast.

[Series 2: ldct screening <30 bmi · axial · 0.88mm/px · z∈[-388,-34]mm · 15 of 77 slices shown, 19 images]
[im 3/77  mediastinal]
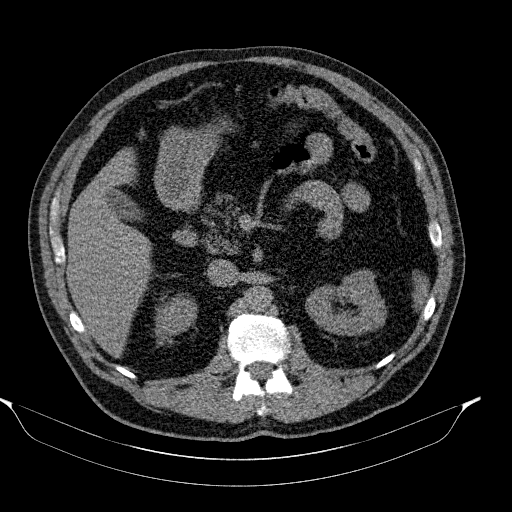
[im 3/77  lung]
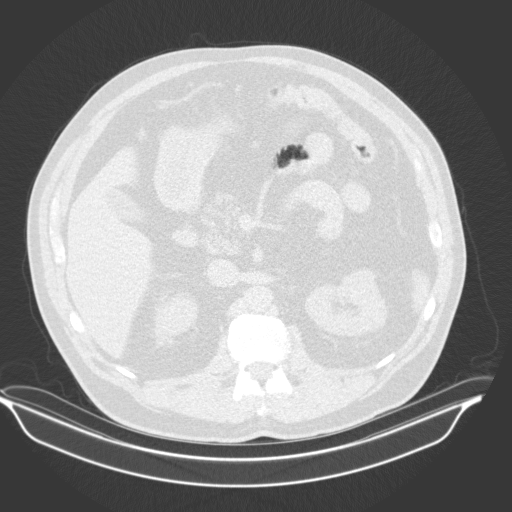
[im 9/77  lung]
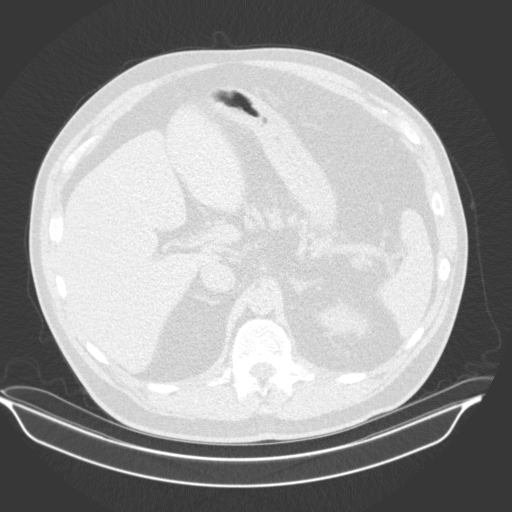
[im 15/77  lung]
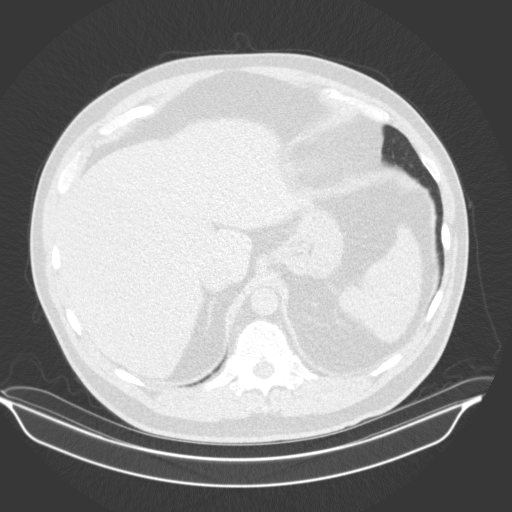
[im 17/77  lung]
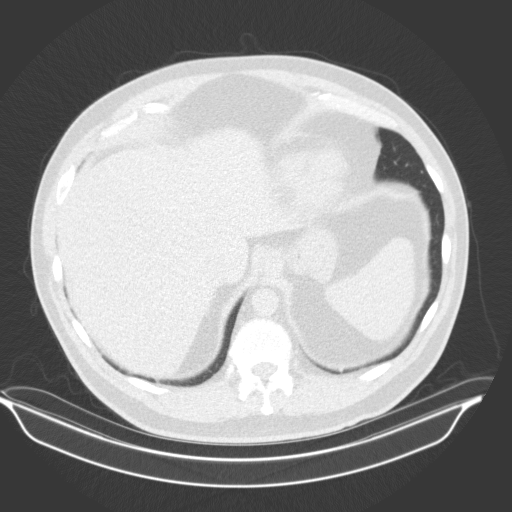
[im 23/77  mediastinal]
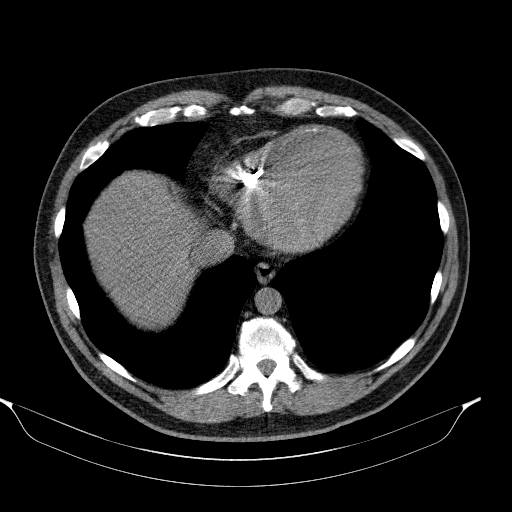
[im 23/77  lung]
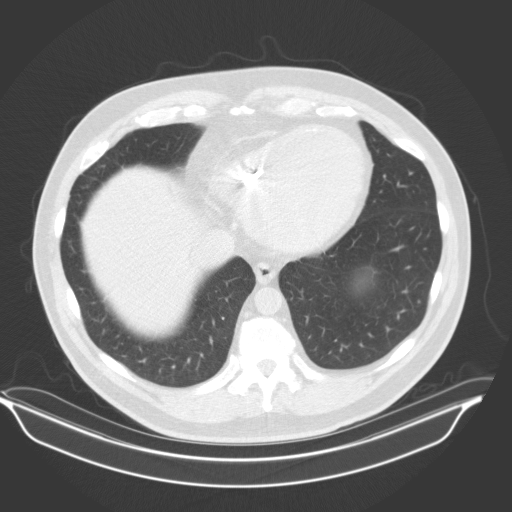
[im 29/77  lung]
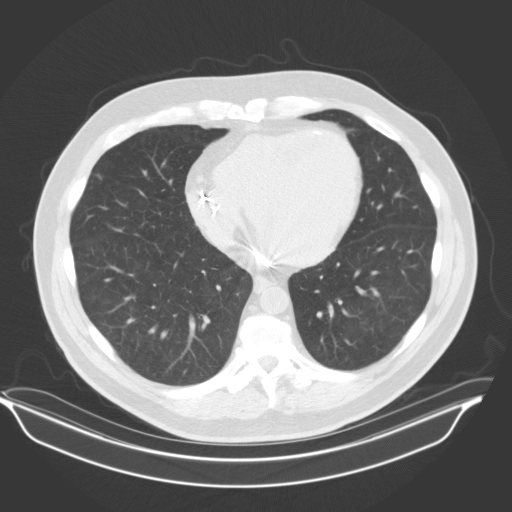
[im 34/77  lung]
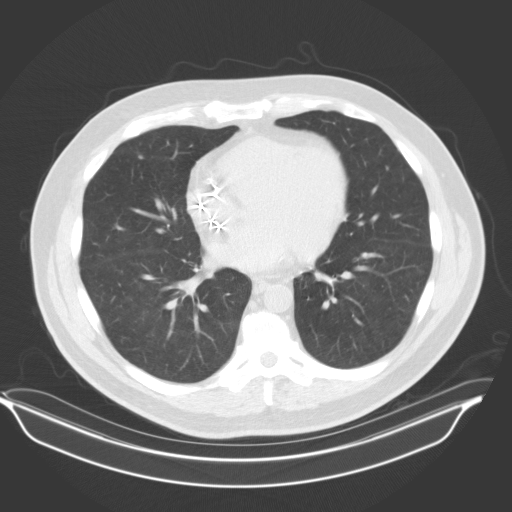
[im 40/77  lung]
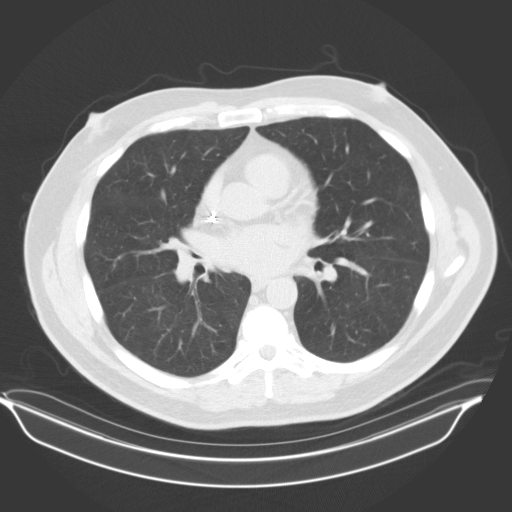
[im 43/77  mediastinal]
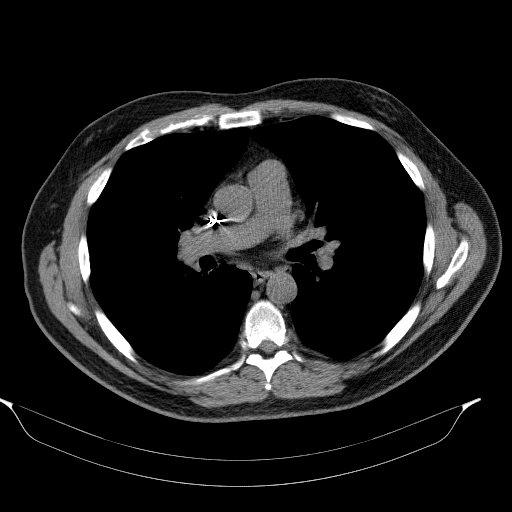
[im 43/77  lung]
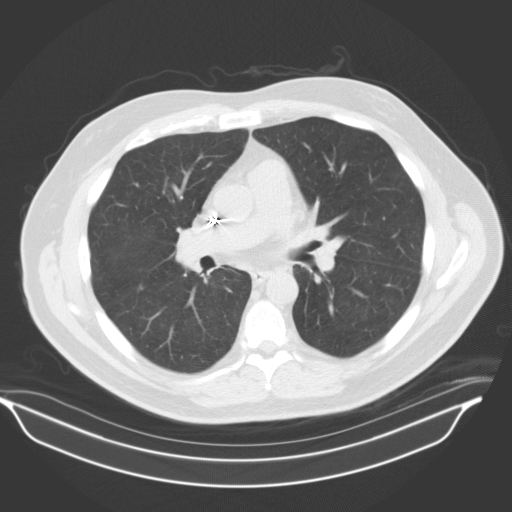
[im 48/77  lung]
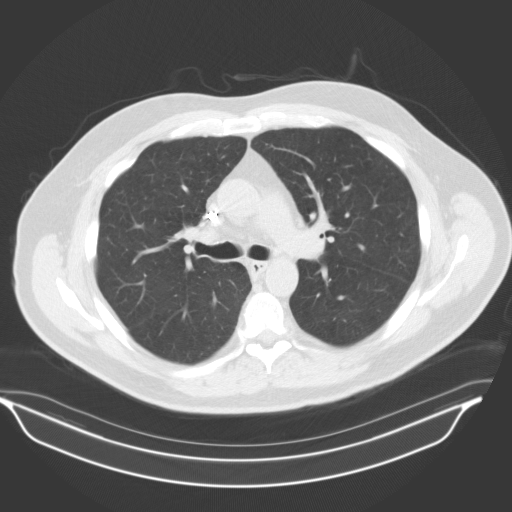
[im 54/77  lung]
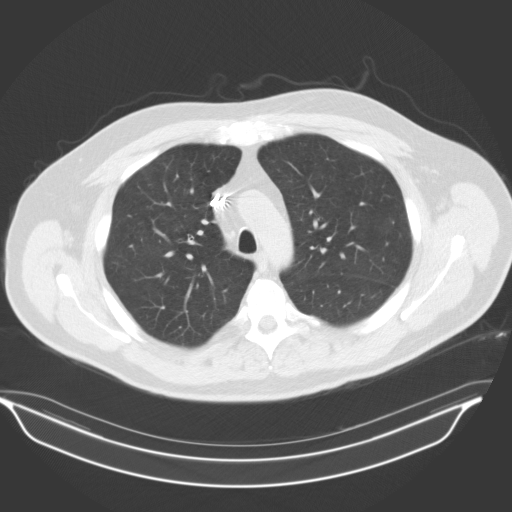
[im 60/77  lung]
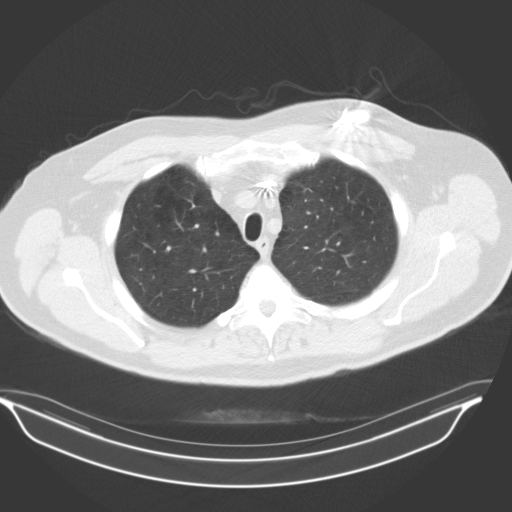
[im 62/77  mediastinal]
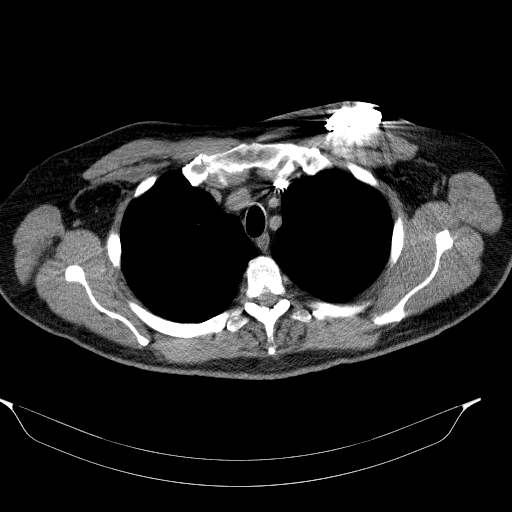
[im 62/77  lung]
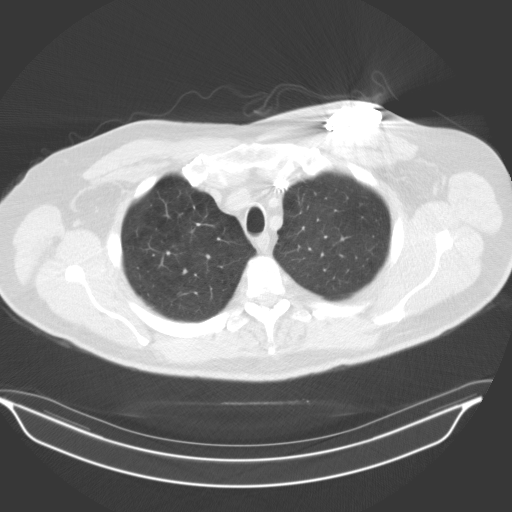
[im 68/77  lung]
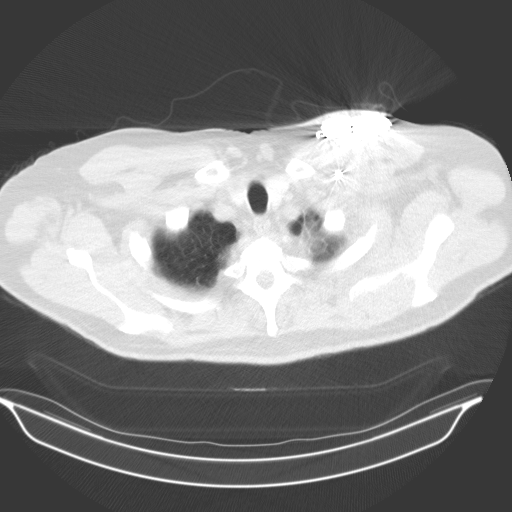
[im 74/77  lung]
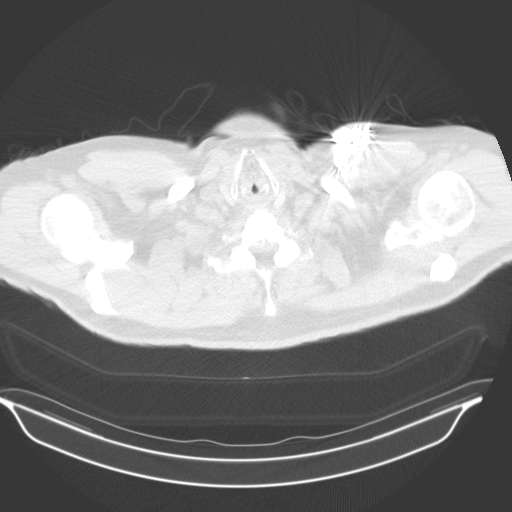

[15 of 31 positions shown; findings below may reference images not displayed]

FINDINGS: Cardiovascular: Normal heart size. No significant pericardial
effusion/thickening. Curvilinear calcification in left ventricular
myocardium is unchanged and compatible with remote myocardial
infarction. Three lead left subclavian ICD is noted with lead tips
in the right atrium, right ventricular apex and coronary sinus.
Three-vessel coronary atherosclerosis. Atherosclerotic nonaneurysmal
thoracic aorta. Stable borderline prominent main pulmonary artery
(3.4 cm diameter).

Mediastinum/Nodes: No discrete thyroid nodules. Unremarkable
esophagus. No pathologically enlarged axillary, mediastinal or hilar
lymph nodes, noting limited sensitivity for the detection of hilar
adenopathy on this noncontrast study.

Lungs/Pleura: No pneumothorax. No pleural effusion. Moderate
centrilobular emphysema with mild diffuse bronchial wall thickening.
No acute consolidative airspace disease or lung masses. Solitary new
solid left lower lobe pulmonary nodule measuring 3.5 mm in volume
derived mean diameter (series 5/image 220). No significant growth of
any of the numerous previously visualized scattered pulmonary
nodules.

Upper abdomen: No acute abnormality.

Musculoskeletal: No aggressive appearing focal osseous lesions. Mild
thoracic spondylosis.
IMPRESSION: 1. Lung-RADS 2, benign appearance or behavior. Continue annual
screening with low-dose chest CT without contrast in 12 months.
2. Three-vessel coronary atherosclerosis.

Aortic Atherosclerosis (EGU6J-YVP.P) and Emphysema (EGU6J-89N.5).

## 2019-08-04 ENCOUNTER — Telehealth: Payer: Self-pay

## 2019-08-04 NOTE — Telephone Encounter (Signed)
Pt called to change his phone number. I did change it for him.

## 2019-09-10 ENCOUNTER — Ambulatory Visit (INDEPENDENT_AMBULATORY_CARE_PROVIDER_SITE_OTHER): Payer: Medicare Other | Admitting: *Deleted

## 2019-09-10 DIAGNOSIS — I5022 Chronic systolic (congestive) heart failure: Secondary | ICD-10-CM | POA: Diagnosis not present

## 2019-09-10 LAB — CUP PACEART REMOTE DEVICE CHECK
Battery Remaining Longevity: 30 mo
Battery Remaining Percentage: 43 %
Brady Statistic RA Percent Paced: 81 %
Brady Statistic RV Percent Paced: 100 %
Date Time Interrogation Session: 20210303035100
HighPow Impedance: 71 Ohm
Implantable Lead Implant Date: 20051207
Implantable Lead Implant Date: 20051207
Implantable Lead Implant Date: 20110721
Implantable Lead Location: 753858
Implantable Lead Location: 753859
Implantable Lead Location: 753860
Implantable Lead Model: 158
Implantable Lead Model: 4543
Implantable Lead Model: 5076
Implantable Lead Serial Number: 148938
Implantable Lead Serial Number: 205859
Implantable Pulse Generator Implant Date: 20110721
Lead Channel Impedance Value: 527 Ohm
Lead Channel Impedance Value: 570 Ohm
Lead Channel Impedance Value: 960 Ohm
Lead Channel Pacing Threshold Amplitude: 0.7 V
Lead Channel Pacing Threshold Amplitude: 0.8 V
Lead Channel Pacing Threshold Amplitude: 1 V
Lead Channel Pacing Threshold Pulse Width: 0.4 ms
Lead Channel Pacing Threshold Pulse Width: 0.4 ms
Lead Channel Pacing Threshold Pulse Width: 0.4 ms
Lead Channel Setting Pacing Amplitude: 1.9 V
Lead Channel Setting Pacing Amplitude: 2 V
Lead Channel Setting Pacing Amplitude: 2.4 V
Lead Channel Setting Pacing Pulse Width: 0.4 ms
Lead Channel Setting Pacing Pulse Width: 0.4 ms
Lead Channel Setting Sensing Sensitivity: 0.5 mV
Lead Channel Setting Sensing Sensitivity: 1 mV
Pulse Gen Serial Number: 139171

## 2019-09-10 NOTE — Progress Notes (Signed)
ICD Remote  

## 2019-12-10 ENCOUNTER — Ambulatory Visit (INDEPENDENT_AMBULATORY_CARE_PROVIDER_SITE_OTHER): Payer: Medicare Other | Admitting: *Deleted

## 2019-12-10 DIAGNOSIS — I472 Ventricular tachycardia, unspecified: Secondary | ICD-10-CM

## 2019-12-10 LAB — CUP PACEART REMOTE DEVICE CHECK
Battery Remaining Longevity: 24 mo
Battery Remaining Percentage: 38 %
Brady Statistic RA Percent Paced: 76 %
Brady Statistic RV Percent Paced: 100 %
Date Time Interrogation Session: 20210602043000
HighPow Impedance: 65 Ohm
Implantable Lead Implant Date: 20051207
Implantable Lead Implant Date: 20051207
Implantable Lead Implant Date: 20110721
Implantable Lead Location: 753858
Implantable Lead Location: 753859
Implantable Lead Location: 753860
Implantable Lead Model: 158
Implantable Lead Model: 4543
Implantable Lead Model: 5076
Implantable Lead Serial Number: 148938
Implantable Lead Serial Number: 205859
Implantable Pulse Generator Implant Date: 20110721
Lead Channel Impedance Value: 470 Ohm
Lead Channel Impedance Value: 538 Ohm
Lead Channel Impedance Value: 946 Ohm
Lead Channel Pacing Threshold Amplitude: 0.7 V
Lead Channel Pacing Threshold Amplitude: 0.8 V
Lead Channel Pacing Threshold Amplitude: 1 V
Lead Channel Pacing Threshold Pulse Width: 0.4 ms
Lead Channel Pacing Threshold Pulse Width: 0.4 ms
Lead Channel Pacing Threshold Pulse Width: 0.4 ms
Lead Channel Setting Pacing Amplitude: 1.9 V
Lead Channel Setting Pacing Amplitude: 2 V
Lead Channel Setting Pacing Amplitude: 2.4 V
Lead Channel Setting Pacing Pulse Width: 0.4 ms
Lead Channel Setting Pacing Pulse Width: 0.4 ms
Lead Channel Setting Sensing Sensitivity: 0.5 mV
Lead Channel Setting Sensing Sensitivity: 1 mV
Pulse Gen Serial Number: 139171

## 2019-12-12 NOTE — Progress Notes (Signed)
Remote ICD transmission.   

## 2020-02-12 ENCOUNTER — Other Ambulatory Visit (HOSPITAL_COMMUNITY): Payer: Self-pay | Admitting: Internal Medicine

## 2020-02-26 ENCOUNTER — Other Ambulatory Visit (HOSPITAL_COMMUNITY): Payer: Self-pay | Admitting: Internal Medicine

## 2020-03-10 ENCOUNTER — Ambulatory Visit (INDEPENDENT_AMBULATORY_CARE_PROVIDER_SITE_OTHER): Payer: Medicare Other | Admitting: *Deleted

## 2020-03-10 DIAGNOSIS — I255 Ischemic cardiomyopathy: Secondary | ICD-10-CM

## 2020-03-10 LAB — CUP PACEART REMOTE DEVICE CHECK
Battery Remaining Longevity: 18 mo
Battery Remaining Percentage: 34 %
Brady Statistic RA Percent Paced: 74 %
Brady Statistic RV Percent Paced: 99 %
Date Time Interrogation Session: 20210901035100
HighPow Impedance: 67 Ohm
Implantable Lead Implant Date: 20051207
Implantable Lead Implant Date: 20051207
Implantable Lead Implant Date: 20110721
Implantable Lead Location: 753858
Implantable Lead Location: 753859
Implantable Lead Location: 753860
Implantable Lead Model: 158
Implantable Lead Model: 4543
Implantable Lead Model: 5076
Implantable Lead Serial Number: 148938
Implantable Lead Serial Number: 205859
Implantable Pulse Generator Implant Date: 20110721
Lead Channel Impedance Value: 479 Ohm
Lead Channel Impedance Value: 543 Ohm
Lead Channel Impedance Value: 918 Ohm
Lead Channel Pacing Threshold Amplitude: 0.7 V
Lead Channel Pacing Threshold Amplitude: 0.8 V
Lead Channel Pacing Threshold Amplitude: 1 V
Lead Channel Pacing Threshold Pulse Width: 0.4 ms
Lead Channel Pacing Threshold Pulse Width: 0.4 ms
Lead Channel Pacing Threshold Pulse Width: 0.4 ms
Lead Channel Setting Pacing Amplitude: 1.9 V
Lead Channel Setting Pacing Amplitude: 2 V
Lead Channel Setting Pacing Amplitude: 2.4 V
Lead Channel Setting Pacing Pulse Width: 0.4 ms
Lead Channel Setting Pacing Pulse Width: 0.4 ms
Lead Channel Setting Sensing Sensitivity: 0.5 mV
Lead Channel Setting Sensing Sensitivity: 1 mV
Pulse Gen Serial Number: 139171

## 2020-03-12 NOTE — Progress Notes (Signed)
Remote ICD transmission.   

## 2020-04-19 ENCOUNTER — Other Ambulatory Visit: Payer: Self-pay

## 2020-04-19 ENCOUNTER — Ambulatory Visit (HOSPITAL_COMMUNITY)
Admission: RE | Admit: 2020-04-19 | Discharge: 2020-04-19 | Disposition: A | Discharge status: Home / Self Care | Payer: Medicare Other | Source: Ambulatory Visit | Attending: Internal Medicine | Admitting: Internal Medicine

## 2020-04-19 VITALS — BP 120/72 | HR 60 | Wt 207.0 lb

## 2020-04-19 DIAGNOSIS — Z7901 Long term (current) use of anticoagulants: Secondary | ICD-10-CM | POA: Insufficient documentation

## 2020-04-19 DIAGNOSIS — I5022 Chronic systolic (congestive) heart failure: Secondary | ICD-10-CM | POA: Insufficient documentation

## 2020-04-19 DIAGNOSIS — Z7982 Long term (current) use of aspirin: Secondary | ICD-10-CM | POA: Diagnosis not present

## 2020-04-19 DIAGNOSIS — I110 Hypertensive heart disease with heart failure: Secondary | ICD-10-CM | POA: Diagnosis not present

## 2020-04-19 DIAGNOSIS — I480 Paroxysmal atrial fibrillation: Secondary | ICD-10-CM | POA: Diagnosis not present

## 2020-04-19 DIAGNOSIS — I2510 Atherosclerotic heart disease of native coronary artery without angina pectoris: Secondary | ICD-10-CM | POA: Diagnosis not present

## 2020-04-19 DIAGNOSIS — Z7984 Long term (current) use of oral hypoglycemic drugs: Secondary | ICD-10-CM | POA: Diagnosis not present

## 2020-04-19 DIAGNOSIS — I252 Old myocardial infarction: Secondary | ICD-10-CM | POA: Diagnosis not present

## 2020-04-19 DIAGNOSIS — Z8719 Personal history of other diseases of the digestive system: Secondary | ICD-10-CM | POA: Diagnosis not present

## 2020-04-19 DIAGNOSIS — E119 Type 2 diabetes mellitus without complications: Secondary | ICD-10-CM | POA: Diagnosis not present

## 2020-04-19 DIAGNOSIS — Z955 Presence of coronary angioplasty implant and graft: Secondary | ICD-10-CM | POA: Insufficient documentation

## 2020-04-19 DIAGNOSIS — I255 Ischemic cardiomyopathy: Secondary | ICD-10-CM | POA: Diagnosis not present

## 2020-04-19 DIAGNOSIS — Z95810 Presence of automatic (implantable) cardiac defibrillator: Secondary | ICD-10-CM | POA: Diagnosis not present

## 2020-04-19 DIAGNOSIS — Z950 Presence of cardiac pacemaker: Secondary | ICD-10-CM | POA: Insufficient documentation

## 2020-04-19 DIAGNOSIS — E669 Obesity, unspecified: Secondary | ICD-10-CM | POA: Insufficient documentation

## 2020-04-19 DIAGNOSIS — K219 Gastro-esophageal reflux disease without esophagitis: Secondary | ICD-10-CM | POA: Diagnosis not present

## 2020-04-19 DIAGNOSIS — E785 Hyperlipidemia, unspecified: Secondary | ICD-10-CM | POA: Diagnosis not present

## 2020-04-19 DIAGNOSIS — I472 Ventricular tachycardia: Secondary | ICD-10-CM | POA: Insufficient documentation

## 2020-04-19 DIAGNOSIS — Z79899 Other long term (current) drug therapy: Secondary | ICD-10-CM | POA: Insufficient documentation

## 2020-04-19 NOTE — Patient Instructions (Signed)
Please call our office in October 2022 to schedule your follow up appointment  If you have any questions or concerns before your next appointment please send [place] a message through [place] or call our office at [phone].    TO LEAVE A MESSAGE FOR THE NURSE SELECT OPTION 2, PLEASE LEAVE A MESSAGE INCLUDING: . YOUR NAME . DATE OF BIRTH . CALL BACK NUMBER . REASON FOR CALL**this is important as we prioritize the call backs  YOU WILL RECEIVE A CALL BACK THE SAME DAY AS LONG AS YOU CALL BEFORE 4:00 PM

## 2020-04-19 NOTE — Progress Notes (Signed)
**Note Scott-Identified via Obfuscation** Patient ID: [name], male   DOB: July 13, 1955, 64 y.o.   MRN: [id number]   ADVANCED HF CLINIC NOTE  PCP: Dr Brigitte Pulse EP: Dr Caryl Comes Cardiologist: Dr Haroldine Laws  HPI: [name] is a 64 year old male with history of chronic systolic heart failure EF 20% 2009, ICM s/p 2 myocardial infarctions and previous stents, [place] [employer] BiVICD in place, S/P device generator replacement in July 2011, DM,  obesity, HTN, and a low HDL. Disabled since 2011.   Myoview 7/11 which showed EF 17% with markedly dilated LV  EDV: 509 ml ESV: 422 ml. Extensive scar with trivial peri-infarct ischemia.  CPX  12/11 pVO2 24.5 slope 31.7 RER 1.12 O2 pulse 105%   In April 2011 received 7 ICD shocks for SVT and now has PTSD from the event.Tried increasing carvedilol in past, but did not tolerate due to diarrhea.   Echo 5/14: EF 20%. RV normal.   Dr. Brigitte Pulse started [place] and he was peeing a lot and relates it to him having AF. So he stopped.   Developed recurrent AF and underwent outpatient DC-CV on 06/06/17. He was feeling ok but developed tachycardia and he called EP and transmission showed VT. He presented to [facility] in [place] and was loaded on amiodarone which broke VT. Echo 06/09/17 EF 20-25%.   Today he returns for 1 year follow up. Feels rundown. Appetite not very good - says nothing has any flavor. Has lost 30 pounds. No SOB, orthopnea, CP or PND.   ICD interrogated in clinic. No VT or AF Personally reviewed   Cath 1/19 Ao = 97/58 (73) LV = 97/14 RA = 1 RV = 33/3 PA = 35/6 (21) PCW = 6 Fick cardiac output/index = 5.2/2.2 PVR =2.9 WU FA sat = 95% PA sat = 66%, 67%  Assessment: 1. 3v CAD with patent RCA stent and CTO LAD 2. ICM EF 15-20% 3. Well-compensated hemodynamics  CPX 07/13/16   FVC 4.82 (86%)    FEV1 3.40 (80%)     FEV1/FVC 71 (92%)     MVV 125 (78%)     Resting HR: 58 Peak HR: 113  (72% age predicted max HR) BP rest: 112/70 BP peak: 146/62 Peak VO2: 19.5 (64% predicted peak VO2) VE/VCO2  slope: 33 OUES: 2.24 Peak RER: 1.06  Ventilatory Threshold: 16.3 (53% predicted and 84% measured peak VO2) VE/MVV: 62% O2pulse: 18  (90% predicted O2pulse)  ROS: All systems negative except as listed in HPI, PMH and Problem List.  Past Medical History:  Diagnosis Date  . AICD (automatic cardioverter/defibrillator) present   . CAD (coronary artery disease) last 2005   multiple MIs  . CHF (congestive heart failure) (HCC)    secondary to severe ischemic cardiomyopathy.   a.    EF 20% with akinesis of the distal half to two-thirds of the ventricle.  Trivial MR   b.     Status post bi-V ICD.   c.     Cardiopulmonary exercise test (12/11) pVO2 of 24.5,VE/VCO2 slope is 31,.7  . Colon polyps   . Diabetes mellitus   . Diverticulosis   . GERD (gastroesophageal reflux disease)   . Headache(784.0)   . Hemorrhoids   . Hyperlipidemia   . 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 Insomnia   . Left bundle branch block   . Persistent atrial fibrillation ([place])   . Presence of permanent cardiac pacemaker   . Prostate neoplasm   . Tinnitus  comes and goes    Current Outpatient Medications  Medication Sig Dispense Refill  . acetaminophen (TYLENOL) 500 MG tablet Take 1,000 mg by mouth daily as needed for moderate pain.    Marland Kitchen aspirin EC 81 MG tablet Take 81 mg by mouth daily. Swallow whole.    . Brimonidine Tartrate (LUMIFY) 0.025 % SOLN Place 1 drop into both eyes daily as needed (redness).    . diphenhydrAMINE HCl (ZZZQUIL) 50 MG/30ML LIQD Take 50 mg by mouth at bedtime.    . Esomeprazole Magnesium (NEXIUM 24HR) 20 MG TBEC Take 10 mg by mouth daily.    . furosemide (LASIX) 40 MG tablet Take 1 tablet (40 mg total) by mouth daily. 90 tablet 3  . glimepiride (AMARYL) 1 MG tablet Take 1 mg by mouth daily with breakfast.    . loperamide (IMODIUM A-D) 2 MG tablet Take 2 mg by mouth as needed for diarrhea or loose stools.    Marland Kitchen  loratadine-pseudoephedrine (CLARITIN-D 24-HOUR) 10-240 MG 24 hr tablet Take 1 tablet by mouth daily as needed for allergies.    . metoprolol succinate (TOPROL-XL) 100 MG 24 hr tablet TAKE 1 TABLET BY MOUTH ONCE DAILY WITH FOOD OR IMMEDIATELY FOLLOWING A MEAL 90 tablet 3  . nitroGLYCERIN (NITROSTAT) 0.4 MG SL tablet Place 1 tablet (0.4 mg total) under the tongue every 5 (five) minutes as needed for chest pain. 25 tablet 6  . PACERONE 100 MG tablet Take 1 tablet by mouth once daily 90 tablet 0  . ramipril (ALTACE) 10 MG capsule Take 1 capsule (10 mg total) by mouth daily. Must be seen for further refills 90 capsule 0  . spironolactone (ALDACTONE) 25 MG tablet Take 1 tablet by mouth in the morning 90 tablet 3  . Tetrahydrozoline HCl (VISINE OP) Place 1 drop into both eyes daily as needed (irritation).    Marland Kitchen zolpidem (AMBIEN CR) 12.5 MG CR tablet Take 12.5 mg by mouth at bedtime.      No current facility-administered medications for this encounter.     PHYSICAL EXAM: Vitals:   04/19/20 1055  BP: 120/72  Pulse: 60  SpO2: 96%   Filed Weights   04/19/20 1055  Weight: 93.9 kg (207 lb)   General:   General:  Well appearing. No resp difficulty HEENT: normal Neck: supple. no JVD. Carotids 2+ bilat; no bruits. No lymphadenopathy or thryomegaly appreciated. Cor: PMI nondisplaced. Regular rate & rhythm. No rubs, gallops or murmurs. Lungs: clear Abdomen: soft, nontender, nondistended. No hepatosplenomegaly. No bruits or masses. Good bowel sounds. Extremities: no cyanosis, clubbing, rash, edema Neuro: alert & orientedx3, cranial nerves grossly intact. moves all 4 extremities w/o difficulty. Affect pleasant  ECG: AV paced 60 Personally reviewed   ASSESSMENT & PLAN: 1. Chronic systolic HF due to iCM EF 20% - CPX 07/2017 and cath results very reassuring. Only mild to moderate HF limitation - Last echo 12/18 EF 20-25% - Stable NYHA II-III symptoms - Volume status ok  - Continue ramipril 10  (previously on Entresto but stopped due to cost)  - Continue Toprol 100 - Continue spiro 25 - Did not tolerate Jardiance - Labs per PCp  2. CAD - Stable by cath 1/19 - No s/s ischemia - He refuses statin due to GI upset.  - Cannot afford PCSK-9 - PCP following.   3. PAF - Status post  DC-CV 11/18. Remains in NSR - Continue amio. Will decrease to 100 daily. Check CMET and TFTs - Previously on Eliquis but stopped due  to cost. Now on ASA - Follows with Dr. Caryl Comes who encouraged him to stay on Eliquis.   4. [place] - ICD interrogated as above.   5. DM2 - Per PCP. Did not tolerate Jardiance.   6. HL - Refuses statin due to GI upset. . Consider PCSK9 inhibitor - PCP managing cholesterol.    Glori Bickers, MD 11:30 AM

## 2020-04-19 NOTE — Addendum Note (Signed)
Encounter addended by: Malena Edman, RN on: 04/19/2020 11:51 AM  Actions taken: Clinical Note Signed

## 2020-05-10 ENCOUNTER — Other Ambulatory Visit (HOSPITAL_COMMUNITY): Payer: Self-pay | Admitting: Internal Medicine

## 2020-05-20 ENCOUNTER — Encounter: Payer: Self-pay | Admitting: Emergency Medicine

## 2020-05-25 ENCOUNTER — Other Ambulatory Visit (HOSPITAL_COMMUNITY): Payer: Self-pay | Admitting: Internal Medicine

## 2020-06-09 ENCOUNTER — Ambulatory Visit (INDEPENDENT_AMBULATORY_CARE_PROVIDER_SITE_OTHER): Payer: Medicare Other

## 2020-06-09 DIAGNOSIS — I255 Ischemic cardiomyopathy: Secondary | ICD-10-CM

## 2020-06-09 LAB — CUP PACEART REMOTE DEVICE CHECK
Battery Remaining Longevity: 18 mo
Battery Remaining Percentage: 30 %
Brady Statistic RA Percent Paced: 73 %
Brady Statistic RV Percent Paced: 99 %
Date Time Interrogation Session: 20211201040500
HighPow Impedance: 70 Ohm
Implantable Lead Implant Date: 20051207
Implantable Lead Implant Date: 20051207
Implantable Lead Implant Date: 20110721
Implantable Lead Location: 753858
Implantable Lead Location: 753859
Implantable Lead Location: 753860
Implantable Lead Model: 158
Implantable Lead Model: 4543
Implantable Lead Model: 5076
Implantable Lead Serial Number: 148938
Implantable Lead Serial Number: 205859
Implantable Pulse Generator Implant Date: 20110721
Lead Channel Impedance Value: 508 Ohm
Lead Channel Impedance Value: 536 Ohm
Lead Channel Impedance Value: 884 Ohm
Lead Channel Pacing Threshold Amplitude: 0.7 V
Lead Channel Pacing Threshold Amplitude: 0.8 V
Lead Channel Pacing Threshold Amplitude: 1 V
Lead Channel Pacing Threshold Pulse Width: 0.4 ms
Lead Channel Pacing Threshold Pulse Width: 0.4 ms
Lead Channel Pacing Threshold Pulse Width: 0.4 ms
Lead Channel Setting Pacing Amplitude: 1.9 V
Lead Channel Setting Pacing Amplitude: 2 V
Lead Channel Setting Pacing Amplitude: 2.4 V
Lead Channel Setting Pacing Pulse Width: 0.4 ms
Lead Channel Setting Pacing Pulse Width: 0.4 ms
Lead Channel Setting Sensing Sensitivity: 0.5 mV
Lead Channel Setting Sensing Sensitivity: 1 mV
Pulse Gen Serial Number: 139171

## 2020-06-16 NOTE — Progress Notes (Signed)
Remote ICD transmission.   

## 2020-06-19 ENCOUNTER — Other Ambulatory Visit: Payer: Self-pay | Admitting: Internal Medicine

## 2020-06-19 DIAGNOSIS — I255 Ischemic cardiomyopathy: Secondary | ICD-10-CM

## 2020-06-29 DIAGNOSIS — I4891 Unspecified atrial fibrillation: Secondary | ICD-10-CM | POA: Insufficient documentation

## 2020-06-30 ENCOUNTER — Encounter: Payer: Self-pay | Admitting: Internal Medicine

## 2020-06-30 ENCOUNTER — Ambulatory Visit: Payer: Medicare Other | Admitting: Internal Medicine

## 2020-06-30 ENCOUNTER — Other Ambulatory Visit: Payer: Self-pay

## 2020-06-30 VITALS — BP 120/68 | HR 60 | Ht 76.0 in | Wt 200.8 lb

## 2020-06-30 DIAGNOSIS — I5022 Chronic systolic (congestive) heart failure: Secondary | ICD-10-CM | POA: Diagnosis not present

## 2020-06-30 DIAGNOSIS — I255 Ischemic cardiomyopathy: Secondary | ICD-10-CM

## 2020-06-30 DIAGNOSIS — Z95810 Presence of automatic (implantable) cardiac defibrillator: Secondary | ICD-10-CM | POA: Diagnosis not present

## 2020-06-30 DIAGNOSIS — I472 Ventricular tachycardia, unspecified: Secondary | ICD-10-CM

## 2020-06-30 DIAGNOSIS — I480 Paroxysmal atrial fibrillation: Secondary | ICD-10-CM

## 2020-06-30 LAB — CUP PACEART INCLINIC DEVICE CHECK
Battery Remaining Longevity: 18 mo
Brady Statistic RA Percent Paced: 84 %
Brady Statistic RV Percent Paced: 99 %
Date Time Interrogation Session: 20211222000000
HighPow Impedance: 47 Ohm
HighPow Impedance: 68 Ohm
Implantable Lead Implant Date: 20051207
Implantable Lead Implant Date: 20051207
Implantable Lead Implant Date: 20110721
Implantable Lead Location: 753858
Implantable Lead Location: 753859
Implantable Lead Location: 753860
Implantable Lead Model: 158
Implantable Lead Model: 4543
Implantable Lead Model: 5076
Implantable Lead Serial Number: 148938
Implantable Lead Serial Number: 205859
Implantable Pulse Generator Implant Date: 20110721
Lead Channel Impedance Value: 517 Ohm
Lead Channel Impedance Value: 579 Ohm
Lead Channel Impedance Value: 946 Ohm
Lead Channel Pacing Threshold Amplitude: 0.7 V
Lead Channel Pacing Threshold Amplitude: 0.7 V
Lead Channel Pacing Threshold Amplitude: 1.1 V
Lead Channel Pacing Threshold Pulse Width: 0.4 ms
Lead Channel Pacing Threshold Pulse Width: 0.4 ms
Lead Channel Pacing Threshold Pulse Width: 0.4 ms
Lead Channel Sensing Intrinsic Amplitude: 25 mV
Lead Channel Sensing Intrinsic Amplitude: 25 mV
Lead Channel Sensing Intrinsic Amplitude: 3.3 mV
Lead Channel Setting Pacing Amplitude: 1.9 V
Lead Channel Setting Pacing Amplitude: 2 V
Lead Channel Setting Pacing Amplitude: 2.4 V
Lead Channel Setting Pacing Pulse Width: 0.4 ms
Lead Channel Setting Pacing Pulse Width: 0.4 ms
Lead Channel Setting Sensing Sensitivity: 0.5 mV
Lead Channel Setting Sensing Sensitivity: 1 mV
Pulse Gen Serial Number: 139171

## 2020-06-30 NOTE — Patient Instructions (Addendum)
Medication Instructions:  Your physician recommends that you continue on your current medications as directed. Please refer to the Current Medication list given to you today.  *If you need a refill on your cardiac medications before your next appointment, please call your pharmacy*   Lab Work: None ordered.  If you have labs (blood work) drawn today and your tests are completely normal, you will receive your results only by: Marland Kitchen MyChart Message (if you have MyChart) OR . A paper copy in the mail If you have any lab test that is abnormal or we need to change your treatment, we will call you to review the results.   Testing/Procedures: None ordered.    Follow-Up: At [facility], you and your health needs are our priority.  As part of our continuing mission to provide you with exceptional heart care, we have created designated Provider Care Teams.  These Care Teams include your primary Cardiologist (physician) and Advanced Practice Providers (APPs -  Physician Assistants and Nurse Practitioners) who all work together to provide you with the care you need, when you need it.  We recommend signing up for the patient portal called "MyChart".  Sign up information is provided on this After Visit Summary.  MyChart is used to connect with patients for Virtual Visits (Telemedicine).  Patients are able to view lab/test results, encounter notes, upcoming appointments, etc.  Non-urgent messages can be sent to your provider as well.   To learn more about what you can do with MyChart, go to [email or web address].    Your next appointment:   October 2022  The format for your next appointment:   In Person  Provider:   Dr Caryl Comes

## 2020-06-30 NOTE — Progress Notes (Signed)
Patient Care Team: Marton Redwood, MD as PCP - General (Internal Medicine) Haroldine Laws, Shaune Pascal, MD as PCP - Advanced Heart Failure (Cardiology) Deboraha Sprang, MD as Consulting Physician (Cardiology)   HPI  [name] is a 64 y.o. male Seen in followup for congestive heart failure in the setting ischemic cardiomyopathy. with history of congestive heart failure secondary to ischemic cardiomyopathy with an EF of 20%. He is status post CRT-D implantation and underwent device generator replacement July 2011.  He has atrial fibrillation-persistent for which he takes amiodarone; he is not anticoagulated --stopped by Dr. Reine Just 6/20 secondary to cost issues.  Aspirin resumed.  Cardioverted 11/18 after persistent atrial fibrillation for months.  Has ventricular tachycardia for which he is also taking his amiodarone  Patient denies symptoms of GI intolerance, sun sensitivity, neurological symptoms attributable to amiodarone.     Denies chest pain.  Mild dyspnea.  No edema.  No nocturnal dyspnea orthopnea.  Biggest complaint is that his hands are stiff and his muscles ache.  This is been over the last 2 or 3 months.  Worse in the morning.  He is status post multiple myocardial infarctions and previous stents. He has a biventricular ICD in place. He under derwent device generator replacement in July 2011.    DATE TEST EF   2011 Myoview  17 %   5/14 Echo   20 %   12/18 Echo  20-25%   1/19 R/LHC  CAD nonobstruc   Date Cr K Hgb TSH LFT  12/18 1.02 3.8 15.6 4.028 18    6/20 1.19 4.0   3.46 35                  Past Medical History:  Diagnosis Date  . AICD (automatic cardioverter/defibrillator) present   . CAD (coronary artery disease) last 2005   multiple MIs  . CHF (congestive heart failure) (HCC)    secondary to severe ischemic cardiomyopathy.   a.    EF 20% with akinesis of the distal half to two-thirds of the ventricle.  Trivial MR   b.     Status post bi-V ICD.   c.     Cardiopulmonary  exercise test (12/11) pVO2 of 24.5,VE/VCO2 slope is 31,.7  . Colon polyps   . Diabetes mellitus   . Diverticulosis   . GERD (gastroesophageal reflux disease)   . Headache(784.0)   . Hemorrhoids   . Hyperlipidemia   . 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 Insomnia   . Left bundle branch block   . Persistent atrial fibrillation ([place])   . Presence of permanent cardiac pacemaker   . Prostate neoplasm   . Tinnitus    comes and goes    Past Surgical History:  Procedure Laterality Date  . CARDIAC CATHETERIZATION  2005   stent x 1 placed  . CARDIOVERSION N/A 06/06/2017   Procedure: CARDIOVERSION;  Surgeon: Larey Dresser, MD;  Location: [facility] ENDOSCOPY;  Service: Cardiovascular;  Laterality: N/A;  . COLONOSCOPY WITH PROPOFOL N/A 06/02/2019   Procedure: COLONOSCOPY WITH PROPOFOL;  Surgeon: Irene Shipper, MD;  Location: [facility] ENDOSCOPY;  Service: Endoscopy;  Laterality: N/A;  . colonscopy     x 2  . dual chamber defibrillator  06/15/2004  . HEMORRHOID BANDING  09-2012  . POLYPECTOMY  06/02/2019   Procedure: POLYPECTOMY;  Surgeon: Irene Shipper, MD;  Location: [facility] ENDOSCOPY;  Service: Endoscopy;;  . RIGHT/LEFT HEART CATH AND CORONARY  ANGIOGRAPHY N/A 07/20/2017   Procedure: RIGHT/LEFT HEART CATH AND CORONARY ANGIOGRAPHY;  Surgeon: Jolaine Artist, MD;  Location: [place] CV LAB;  Service: Cardiovascular;  Laterality: N/A;  . single-chamber ICD implantation  08/10/2003  . TONSILLECTOMY  age 61    Current Outpatient Medications  Medication Sig Dispense Refill  . acetaminophen (TYLENOL) 500 MG tablet Take 1,000 mg by mouth daily as needed for moderate pain.    Marland Kitchen aspirin EC 81 MG tablet Take 81 mg by mouth daily. Swallow whole.    . Brimonidine Tartrate (LUMIFY) 0.025 % SOLN Place 1 drop into both eyes daily as needed (redness).    . diphenhydrAMINE HCl (ZZZQUIL) 50 MG/30ML LIQD Take 50 mg by mouth at bedtime.    . Esomeprazole Magnesium  (NEXIUM 24HR) 20 MG TBEC Take 10 mg by mouth daily.    . furosemide (LASIX) 40 MG tablet Take 1 tablet by mouth once daily 90 tablet 0  . glimepiride (AMARYL) 1 MG tablet Take 1 mg by mouth daily with breakfast.    . loperamide (IMODIUM A-D) 2 MG tablet Take 2 mg by mouth as needed for diarrhea or loose stools.    Marland Kitchen loratadine-pseudoephedrine (CLARITIN-D 24-HOUR) 10-240 MG 24 hr tablet Take 1 tablet by mouth daily as needed for allergies.    . metoprolol succinate (TOPROL-XL) 100 MG 24 hr tablet TAKE 1 TABLET BY MOUTH ONCE DAILY WITH FOOD OR IMMEDIATELY FOLLOWING A MEAL 90 tablet 0  . nitroGLYCERIN (NITROSTAT) 0.4 MG SL tablet Place 1 tablet (0.4 mg total) under the tongue every 5 (five) minutes as needed for chest pain. 25 tablet 6  . PACERONE 100 MG tablet Take 1 tablet by mouth once daily 90 tablet 0  . ramipril (ALTACE) 10 MG capsule TAKE 1 CAPSULE BY MOUTH ONCE DAILY MUST  BE  SEEN  FOR  FURTHER  REFILLS 90 capsule 0  . spironolactone (ALDACTONE) 25 MG tablet TAKE 1 TABLET BY MOUTH IN THE MORNING 90 tablet 0  . Tetrahydrozoline HCl (VISINE OP) Place 1 drop into both eyes daily as needed (irritation).    . traMADol (ULTRAM) 50 MG tablet Take 50 mg by mouth every 6 (six) hours as needed for moderate pain.    Marland Kitchen zolpidem (AMBIEN CR) 12.5 MG CR tablet Take 12.5 mg by mouth at bedtime.     No current facility-administered medications for this visit.  Reviewed  Allergies  Allergen Reactions  . Coreg [Carvedilol] Diarrhea  . Entresto [Sacubitril-Valsartan] Other (See Comments)    Joint pain  . Jardiance [Empagliflozin] Other (See Comments)    Possible afib, definitely cost  . Lipitor [Atorvastatin] Diarrhea    Review of Systems negative except from HPI and PMH  Physical Exam BP 120/68   Pulse 60   Ht 6\' 4"  (1.93 m)   Wt 200 lb 12.8 oz (91.1 kg)   SpO2 97%   BMI 24.44 kg/m  Well developed and well nourished in no acute distress HENT normal Neck supple with JVP-flat Clear Device  pocket well healed; without hematoma or erythema.  There is no tethering  Regular rate and rhythm, no  murmur Abd-soft with active BS No Clubbing cyanosis  edema Skin-warm and dry A & Oriented  Grossly normal sensory and motor function; mild tremor  ECG AV pacing with a negative QRS lead I and a RS in lead V1   Assessment and  Plan  Ischemic cardiomyopathy  High Risk Medication Surveillance  Congestive heart failure-chronic-systolic  Implantable defibrillator-CRT      Ventricular tachycardia  Atrial fibrillation   Morning joint stiffness  Without symptoms of ischemia  Not on anticoagulation per his choice.  Continue aspirin given his ischemic heart disease  Euvolemic continue current meds  We will check amiodarone surveillance laboratories; blood work was drawn by Dr. Brigitte Pulse last month.  We will follow to Get it.  Morning joint stiffness and muscle aches are concerning for systemic inflammatory disease.  Offered to reach out to Dr. Brigitte Pulse.  The patient has follow-up scheduled with Dr. Brigitte Pulse in a few months he said he would review with him at that time.

## 2020-08-04 ENCOUNTER — Other Ambulatory Visit (HOSPITAL_COMMUNITY): Payer: Self-pay | Admitting: Internal Medicine

## 2020-08-16 ENCOUNTER — Telehealth (HOSPITAL_COMMUNITY): Payer: Self-pay | Admitting: *Deleted

## 2020-08-16 NOTE — Telephone Encounter (Signed)
He needs to d/w Dr. Caryl Comes

## 2020-08-16 NOTE — Telephone Encounter (Signed)
Pt left vm stating he had to stop pacerone because it was causing him to shake so bad he couldn't write. Pt said it caused aches in hands and legs. Pt stopped last Thursday and has not had any shakes or aches since. Pt wants to know if there is something else he can take or a lower dose.   Routed to [place]

## 2020-08-16 NOTE — Telephone Encounter (Signed)
Will forward to [facility] triage. Per Dr.Bensimhon pt needs to discuss with Dr.Klein

## 2020-08-17 NOTE — Telephone Encounter (Signed)
Dr Caryl Comes has spoken with Dr Haroldine Laws re: this pt.

## 2020-08-17 NOTE — Telephone Encounter (Signed)
DAn  amio started for VT 2018  I would stop amio

## 2020-08-25 ENCOUNTER — Other Ambulatory Visit: Payer: Self-pay

## 2020-09-08 ENCOUNTER — Ambulatory Visit (INDEPENDENT_AMBULATORY_CARE_PROVIDER_SITE_OTHER): Payer: Medicare Other

## 2020-09-08 DIAGNOSIS — I255 Ischemic cardiomyopathy: Secondary | ICD-10-CM

## 2020-09-08 DIAGNOSIS — I5022 Chronic systolic (congestive) heart failure: Secondary | ICD-10-CM

## 2020-09-10 LAB — CUP PACEART REMOTE DEVICE CHECK
Battery Remaining Longevity: 12 mo
Battery Remaining Percentage: 24 %
Brady Statistic RA Percent Paced: 59 %
Brady Statistic RV Percent Paced: 99 %
Date Time Interrogation Session: 20220302035200
HighPow Impedance: 73 Ohm
Implantable Lead Implant Date: 20051207
Implantable Lead Implant Date: 20051207
Implantable Lead Implant Date: 20110721
Implantable Lead Location: 753858
Implantable Lead Location: 753859
Implantable Lead Location: 753860
Implantable Lead Model: 158
Implantable Lead Model: 4543
Implantable Lead Model: 5076
Implantable Lead Serial Number: 148938
Implantable Lead Serial Number: 205859
Implantable Pulse Generator Implant Date: 20110721
Lead Channel Impedance Value: 512 Ohm
Lead Channel Impedance Value: 562 Ohm
Lead Channel Impedance Value: 962 Ohm
Lead Channel Pacing Threshold Amplitude: 0.7 V
Lead Channel Pacing Threshold Amplitude: 0.7 V
Lead Channel Pacing Threshold Amplitude: 1.1 V
Lead Channel Pacing Threshold Pulse Width: 0.4 ms
Lead Channel Pacing Threshold Pulse Width: 0.4 ms
Lead Channel Pacing Threshold Pulse Width: 0.4 ms
Lead Channel Setting Pacing Amplitude: 1.9 V
Lead Channel Setting Pacing Amplitude: 2 V
Lead Channel Setting Pacing Amplitude: 2.4 V
Lead Channel Setting Pacing Pulse Width: 0.4 ms
Lead Channel Setting Pacing Pulse Width: 0.4 ms
Lead Channel Setting Sensing Sensitivity: 0.5 mV
Lead Channel Setting Sensing Sensitivity: 1 mV
Pulse Gen Serial Number: 139171

## 2020-09-13 ENCOUNTER — Other Ambulatory Visit: Payer: Self-pay | Admitting: Internal Medicine

## 2020-09-13 DIAGNOSIS — I255 Ischemic cardiomyopathy: Secondary | ICD-10-CM

## 2020-09-13 NOTE — Telephone Encounter (Signed)
Patient called in about his medicine pacerone he is having bad tremors with it and he would like someone to give him a call about the dosage and if possibly it can be changed

## 2020-09-13 NOTE — Telephone Encounter (Signed)
This was sent to the [facility] pool. I will forward this call to Dr. Clayborne Dana nurse Nira Conn in regards to the [facility] question. Will send to Dr. Olin Pia nurse in regards to other refills.

## 2020-09-14 NOTE — Telephone Encounter (Signed)
Furosemide and Spironolactone Rx have been approved and sent to pharmacy.  Pharmacy has confirmed receipt.

## 2020-09-17 NOTE — Progress Notes (Signed)
Remote ICD transmission.   

## 2020-09-22 ENCOUNTER — Telehealth: Payer: Self-pay | Admitting: Internal Medicine

## 2020-09-22 NOTE — Telephone Encounter (Signed)
Pt c/o medication issue: 1. Name of Medication: Pacerone 100 2. How are you currently taking this medication (dosage and times per day)?  3. Are you having a reaction (difficulty breathing--STAT)?  No  4. What is your medication issue? Patient states that he has the trimmers and seems to be getting worse.

## 2020-09-22 NOTE — Telephone Encounter (Signed)
Attempted to call patient x3. Phone rang once and went straight to voicemail however inbox was full and unable to leave a message.

## 2020-09-24 NOTE — Telephone Encounter (Signed)
Attempted phone call to pt.  Received message voicemail has not been set up and unable to leave message.

## 2020-09-25 ENCOUNTER — Other Ambulatory Visit (HOSPITAL_COMMUNITY): Payer: Self-pay | Admitting: Internal Medicine

## 2020-09-27 ENCOUNTER — Other Ambulatory Visit: Payer: Self-pay | Admitting: Internal Medicine

## 2020-09-30 NOTE — Telephone Encounter (Signed)
Attempted phone call to pt.  Call went straight to message saying voicemail has not been set up.  We have attempted to contact pt multiple times without success.

## 2020-11-05 ENCOUNTER — Other Ambulatory Visit (HOSPITAL_COMMUNITY): Payer: Self-pay | Admitting: Internal Medicine

## 2020-11-25 ENCOUNTER — Telehealth: Payer: Self-pay

## 2020-11-25 NOTE — Telephone Encounter (Signed)
Patient returning call. Discussed ATP therapy delivered 11/23/20. Patient states he was unaware of SVT and was sleeping or laying in bed watching TV at time of episode. He did stated he received his second covid booster on Monday prior to SVT event Tuesday morning. Reviewed medications including prescribed Amio 100mg  daily and Toprol XL 100mg  daily. Patient informed RN that he was only taking Amiodarone 100mg  M-W-F secondary to tremors related to medication. Discussed with Dr. Caryl Comes who recommended patient resume Amio daily. Dr. Caryl Comes also requested RN to inform patient there may be a procedure for SVT that can reduce if not totally negate need for medications. This will be discussed during patient follow up appt with Dr. Caryl Comes in September. Patient appreciative of call and agrees to resume Amio as indicated. He is instructed to contact device clinic if tremors resume and affect his quality of life. Will continue to follow.

## 2020-11-25 NOTE — Telephone Encounter (Signed)
Carelink alert received today for NSVT 11/23/20 treated with ATP x4. Reviewed with Dr. Caryl Comes who agrees this was inappropriate treatment for SVT. Attempted to contact patient to assess for s/s of tachycardia. [employer] compliant VM message left requesting call back to [phone]. No driving restrictions indicated.

## 2020-12-08 ENCOUNTER — Ambulatory Visit (INDEPENDENT_AMBULATORY_CARE_PROVIDER_SITE_OTHER): Payer: Medicare Other

## 2020-12-08 DIAGNOSIS — I255 Ischemic cardiomyopathy: Secondary | ICD-10-CM | POA: Diagnosis not present

## 2020-12-09 LAB — CUP PACEART REMOTE DEVICE CHECK
Battery Remaining Longevity: 12 mo
Battery Remaining Percentage: 23 %
Brady Statistic RA Percent Paced: 55 %
Brady Statistic RV Percent Paced: 99 %
Date Time Interrogation Session: 20220601045400
HighPow Impedance: 67 Ohm
Implantable Lead Implant Date: 20051207
Implantable Lead Implant Date: 20051207
Implantable Lead Implant Date: 20110721
Implantable Lead Location: 753858
Implantable Lead Location: 753859
Implantable Lead Location: 753860
Implantable Lead Model: 158
Implantable Lead Model: 4543
Implantable Lead Model: 5076
Implantable Lead Serial Number: 148938
Implantable Lead Serial Number: 205859
Implantable Pulse Generator Implant Date: 20110721
Lead Channel Impedance Value: 484 Ohm
Lead Channel Impedance Value: 540 Ohm
Lead Channel Impedance Value: 902 Ohm
Lead Channel Pacing Threshold Amplitude: 0.7 V
Lead Channel Pacing Threshold Amplitude: 0.7 V
Lead Channel Pacing Threshold Amplitude: 1.1 V
Lead Channel Pacing Threshold Pulse Width: 0.4 ms
Lead Channel Pacing Threshold Pulse Width: 0.4 ms
Lead Channel Pacing Threshold Pulse Width: 0.4 ms
Lead Channel Setting Pacing Amplitude: 1.9 V
Lead Channel Setting Pacing Amplitude: 2 V
Lead Channel Setting Pacing Amplitude: 2.4 V
Lead Channel Setting Pacing Pulse Width: 0.4 ms
Lead Channel Setting Pacing Pulse Width: 0.4 ms
Lead Channel Setting Sensing Sensitivity: 0.5 mV
Lead Channel Setting Sensing Sensitivity: 1 mV
Pulse Gen Serial Number: 139171

## 2020-12-15 ENCOUNTER — Other Ambulatory Visit: Payer: Self-pay | Admitting: Internal Medicine

## 2020-12-15 DIAGNOSIS — I255 Ischemic cardiomyopathy: Secondary | ICD-10-CM

## 2020-12-31 NOTE — Progress Notes (Signed)
Remote ICD transmission.   

## 2021-01-07 ENCOUNTER — Other Ambulatory Visit: Payer: Self-pay | Admitting: Internal Medicine

## 2021-03-09 ENCOUNTER — Ambulatory Visit (INDEPENDENT_AMBULATORY_CARE_PROVIDER_SITE_OTHER): Payer: Medicare Other

## 2021-03-09 DIAGNOSIS — I255 Ischemic cardiomyopathy: Secondary | ICD-10-CM

## 2021-03-09 LAB — CUP PACEART REMOTE DEVICE CHECK
Battery Remaining Longevity: 9 mo
Battery Remaining Percentage: 13 %
Brady Statistic RA Percent Paced: 56 %
Brady Statistic RV Percent Paced: 99 %
Date Time Interrogation Session: 20220831042900
HighPow Impedance: 63 Ohm
Implantable Lead Implant Date: 20051207
Implantable Lead Implant Date: 20051207
Implantable Lead Implant Date: 20110721
Implantable Lead Location: 753858
Implantable Lead Location: 753859
Implantable Lead Location: 753860
Implantable Lead Model: 158
Implantable Lead Model: 4543
Implantable Lead Model: 5076
Implantable Lead Serial Number: 148938
Implantable Lead Serial Number: 205859
Implantable Pulse Generator Implant Date: 20110721
Lead Channel Impedance Value: 461 Ohm
Lead Channel Impedance Value: 537 Ohm
Lead Channel Impedance Value: 914 Ohm
Lead Channel Pacing Threshold Amplitude: 0.7 V
Lead Channel Pacing Threshold Amplitude: 0.7 V
Lead Channel Pacing Threshold Amplitude: 1.1 V
Lead Channel Pacing Threshold Pulse Width: 0.4 ms
Lead Channel Pacing Threshold Pulse Width: 0.4 ms
Lead Channel Pacing Threshold Pulse Width: 0.4 ms
Lead Channel Setting Pacing Amplitude: 1.9 V
Lead Channel Setting Pacing Amplitude: 2 V
Lead Channel Setting Pacing Amplitude: 2.4 V
Lead Channel Setting Pacing Pulse Width: 0.4 ms
Lead Channel Setting Pacing Pulse Width: 0.4 ms
Lead Channel Setting Sensing Sensitivity: 0.5 mV
Lead Channel Setting Sensing Sensitivity: 1 mV
Pulse Gen Serial Number: 139171

## 2021-03-22 NOTE — Progress Notes (Signed)
Remote ICD transmission.   

## 2021-04-04 ENCOUNTER — Other Ambulatory Visit (HOSPITAL_COMMUNITY): Payer: Self-pay | Admitting: Internal Medicine

## 2021-04-04 DIAGNOSIS — Z0000 Encounter for general adult medical examination without abnormal findings: Secondary | ICD-10-CM

## 2021-04-05 ENCOUNTER — Encounter: Payer: Self-pay | Admitting: Internal Medicine

## 2021-04-05 ENCOUNTER — Other Ambulatory Visit: Payer: Self-pay

## 2021-04-05 ENCOUNTER — Ambulatory Visit: Payer: Medicare Other | Admitting: Internal Medicine

## 2021-04-05 ENCOUNTER — Other Ambulatory Visit (HOSPITAL_COMMUNITY): Payer: Self-pay | Admitting: Internal Medicine

## 2021-04-05 ENCOUNTER — Ambulatory Visit (HOSPITAL_COMMUNITY)
Admission: RE | Admit: 2021-04-05 | Discharge: 2021-04-05 | Disposition: A | Discharge status: Home / Self Care | Payer: Medicare Other | Source: Ambulatory Visit | Attending: Internal Medicine | Admitting: Internal Medicine

## 2021-04-05 VITALS — BP 118/64 | HR 88 | Ht 75.0 in | Wt 199.6 lb

## 2021-04-05 DIAGNOSIS — Z0000 Encounter for general adult medical examination without abnormal findings: Secondary | ICD-10-CM

## 2021-04-05 DIAGNOSIS — I5022 Chronic systolic (congestive) heart failure: Secondary | ICD-10-CM | POA: Diagnosis not present

## 2021-04-05 DIAGNOSIS — Z87891 Personal history of nicotine dependence: Secondary | ICD-10-CM | POA: Insufficient documentation

## 2021-04-05 DIAGNOSIS — Z136 Encounter for screening for cardiovascular disorders: Secondary | ICD-10-CM | POA: Diagnosis present

## 2021-04-05 DIAGNOSIS — I472 Ventricular tachycardia, unspecified: Secondary | ICD-10-CM

## 2021-04-05 DIAGNOSIS — I4891 Unspecified atrial fibrillation: Secondary | ICD-10-CM

## 2021-04-05 DIAGNOSIS — I255 Ischemic cardiomyopathy: Secondary | ICD-10-CM | POA: Diagnosis not present

## 2021-04-05 DIAGNOSIS — Z95810 Presence of automatic (implantable) cardiac defibrillator: Secondary | ICD-10-CM

## 2021-04-05 NOTE — Patient Instructions (Signed)

## 2021-04-05 NOTE — Progress Notes (Signed)
Patient Care Team: Ginger Organ., MD as PCP - General (Internal Medicine) Haroldine Laws, Shaune Pascal, MD as PCP - Advanced Heart Failure (Cardiology) Deboraha Sprang, MD as Consulting Physician (Cardiology)   HPI  [name] is a 64 y.o. male Seen in followup for ventricular tachycardia and congestive heart failure in the setting ischemic cardiomyopathy .   He is status post CRT-D implantation;underwent generator replacement July 2011. He is status post multiple myocardial infarctions and previous stents.   Atrial Takes amiodarone for the VT as well as atrial fiibrillation-not anticoagulated --stopped by Dr. Reine Just 6/20 secondary to cost issues.  Aspirin resumed.       Patient denies symptoms of GI intolerance, sun sensitivity, significant tremulousness which has Improved but not gone away with a decrease in his amiodarone from 200--100 mg a day   Today, he is feeling well overall.  When he walks to his mailbox and back, he becomes short of breath. This remains unchanged for the past 2 years.The patient denies chest pain, nocturnal dyspnea, orthopnea or peripheral edema.  There have been no palpitations, lightheadedness or syncope.   The other part is is all oral source secondary  Every night he typically drinks 10-12 oz of bourbon. He also takes Z-Quil and Ambien, but continues to have difficulty with falling asleep.  As of his physical completed yesterday, his cholesterol remains uncontrolled. He refuses to take cholesterol medication.  Currently he has had 3 Covid vaccinations, and will receive his 4th booster in a few weeks. Secondary issues  Complains of easy bleeding, and tremors (due to side effect of pacerone).   DATE TEST EF   2011 Myoview  17 %   5/14 Echo   20 %   12/18 Echo  20-25%   1/19 R/LHC  CAD nonobstruc   Date Cr K Hgb TSH LFT  12/18 1.02 3.8 15.6 4.028 18    6/20 1.19 4.0   3.46 35               Past Medical History:  Diagnosis Date   AICD (automatic  cardioverter/defibrillator) present    CAD (coronary artery disease) last 2005   multiple MIs   CHF (congestive heart failure) ([place])    secondary to severe ischemic cardiomyopathy.   a.    EF 20% with akinesis of the distal half to two-thirds of the ventricle.  Trivial MR   b.     Status post bi-V ICD.   c.     Cardiopulmonary exercise test (12/11) pVO2 of 24.5,VE/VCO2 slope is 31,.7   Colon polyps    Diabetes mellitus    Diverticulosis    GERD (gastroesophageal reflux disease)    Headache(784.0)    Hemorrhoids    Hyperlipidemia    ICD (implantable cardiac defibrillator) in place 10/2009   shocks due to sinus tac   Insomnia    Left bundle branch block    Persistent atrial fibrillation (HCC)    Presence of permanent cardiac pacemaker    Prostate neoplasm    Tinnitus    comes and goes    Past Surgical History:  Procedure Laterality Date   CARDIAC CATHETERIZATION  2005   stent x 1 placed   CARDIOVERSION N/A 06/06/2017   Procedure: CARDIOVERSION;  Surgeon: Larey Dresser, MD;  Location: [place];  Service: Cardiovascular;  Laterality: N/A;   COLONOSCOPY WITH PROPOFOL N/A 06/02/2019   Procedure: COLONOSCOPY WITH PROPOFOL;  Surgeon: Irene Shipper, MD;  Location: [facility] ENDOSCOPY;  Service:  Endoscopy;  Laterality: N/A;   colonscopy     x 2   dual chamber defibrillator  06/15/2004   HEMORRHOID BANDING  09-2012   POLYPECTOMY  06/02/2019   Procedure: POLYPECTOMY;  Surgeon: Irene Shipper, MD;  Location: [facility] ENDOSCOPY;  Service: Endoscopy;;   RIGHT/LEFT HEART CATH AND CORONARY ANGIOGRAPHY N/A 07/20/2017   Procedure: RIGHT/LEFT HEART CATH AND CORONARY ANGIOGRAPHY;  Surgeon: Jolaine Artist, MD;  Location: [place] [facility];  Service: Cardiovascular;  Laterality: N/A;   single-chamber ICD implantation  08/10/2003   TONSILLECTOMY  age 28    Current Outpatient Medications  Medication Sig Dispense Refill   acetaminophen (TYLENOL) 500 MG tablet Take 1,000 mg by mouth daily as needed for  moderate pain.     aspirin EC 81 MG tablet Take 81 mg by mouth daily. Swallow whole.     Brimonidine Tartrate (LUMIFY) 0.025 % SOLN Place 1 drop into both eyes daily as needed (redness).     diphenhydrAMINE HCl (ZZZQUIL) 50 MG/30ML LIQD Take 50 mg by mouth at bedtime.     Esomeprazole Magnesium (NEXIUM 24HR) 20 MG TBEC Take 10 mg by mouth daily.     furosemide (LASIX) 40 MG tablet Take 1 tablet (40 mg total) by mouth daily. Pt needs to keep upcoming appt in Sept for further refills 90 tablet 1   glimepiride (AMARYL) 1 MG tablet Take 1 mg by mouth daily with breakfast.     loperamide (IMODIUM A-D) 2 MG tablet Take 2 mg by mouth as needed for diarrhea or loose stools.     loratadine-pseudoephedrine (CLARITIN-D 24-HOUR) 10-240 MG 24 hr tablet Take 1 tablet by mouth daily as needed for allergies.     metoprolol succinate (TOPROL-XL) 100 MG 24 hr tablet TAKE 1 TABLET BY MOUTH ONCE DAILY WITH  FOOD  OR  IMMEDIATELY  FOLLOWING  A  MEAL 90 tablet 1   nitroGLYCERIN (NITROSTAT) 0.4 MG SL tablet Place 1 tablet (0.4 mg total) under the tongue every 5 (five) minutes as needed for chest pain. 25 tablet 6   PACERONE 100 MG tablet Take 1 tablet by mouth once daily 90 tablet 3   ramipril (ALTACE) 10 MG capsule Take 1 capsule (10 mg total) by mouth daily. 90 capsule 3   spironolactone (ALDACTONE) 25 MG tablet Take 1 tablet by mouth in the morning - Pt needs to keep upcoming appt in Sept for further refills 90 tablet 1   Tetrahydrozoline HCl (VISINE OP) Place 1 drop into both eyes daily as needed (irritation).     traMADol (ULTRAM) 50 MG tablet Take 50 mg by mouth every 6 (six) hours as needed for moderate pain.     zolpidem (AMBIEN CR) 12.5 MG CR tablet Take 12.5 mg by mouth at bedtime.     No current facility-administered medications for this visit.  Reviewed  Allergies  Allergen Reactions   Coreg [Carvedilol] Diarrhea   Entresto [Sacubitril-Valsartan] Other (See Comments)    Joint pain   Jardiance  [Empagliflozin] Other (See Comments)    Possible afib, definitely cost   Lipitor [Atorvastatin] Diarrhea    Review of Systems negative except from HPI and PMH  Physical Exam BP 118/64   Pulse 88   Ht 6\' 3"  (1.905 m)   Wt 199 lb 9.6 oz (90.5 kg)   SpO2 98%   BMI 24.95 kg/m  Well developed and well nourished in no acute distress HENT normal Neck supple with JVP-flat Clear Device pocket well healed; without hematoma  or erythema.  There is no tethering  Regular rate and rhythm, no  murmur Abd-soft with active BS No Clubbing cyanosis  edema Skin-warm and dry A & Oriented  Grossly normal sensory and motor function  ECG sinus with P synchronous pacing with an upright QRS lead V1 and negative QRS in lead I    Assessment and  Plan  Ischemic cardiomyopathy  High Risk Medication Surveillance-amiodarone  Congestive heart failure-chronic-systolic     Implantable defibrillator-CRT      Ventricular tachycardia  Atrial fibrillation   Tremulousness     Without symptoms of ischemia.  Continue ASA 81,  and metoprolol 100 daily he refuses statins and affect he wanted to talk about  No interval atrial fibrillation.  He refuses anticoagulation  Interval ventricular tachycardia failed to pace terminate.  Slow.  Continue amiodarone at 100 mg a day.  Discussed alternatives including catheter ablation.  He is at this point not sure what he would like to do.  The worst of his outcomes, would be to have fast persistent recurrent ventricular tachycardia which he had previously had about 160 bpm  I will review his tracings with Dr. Elliot Cousin.  We will then discuss again with the patient as to whether he is inclined towards a consultation to discuss catheter ablation  His device is approaching ERI.  We will plan to see him again in 6 months time      [facility] Stumpf,acting as a scribe for [name], MD.,have documented all relevant documentation on the behalf of [name], MD,as directed  by  [name], MD while in the presence of [name], MD.  I, [name], MD, have reviewed all documentation for this visit. The documentation on 04/05/21 for the exam, diagnosis, procedures, and orders are all accurate and complete.

## 2021-06-08 ENCOUNTER — Ambulatory Visit (INDEPENDENT_AMBULATORY_CARE_PROVIDER_SITE_OTHER): Payer: Medicare Other

## 2021-06-08 DIAGNOSIS — I255 Ischemic cardiomyopathy: Secondary | ICD-10-CM | POA: Diagnosis not present

## 2021-06-08 LAB — CUP PACEART REMOTE DEVICE CHECK
Battery Remaining Longevity: 9 mo
Battery Remaining Percentage: 13 %
Brady Statistic RA Percent Paced: 60 %
Brady Statistic RV Percent Paced: 99 %
Date Time Interrogation Session: 20221130042700
HighPow Impedance: 71 Ohm
Implantable Lead Implant Date: 20051207
Implantable Lead Implant Date: 20051207
Implantable Lead Implant Date: 20110721
Implantable Lead Location: 753858
Implantable Lead Location: 753859
Implantable Lead Location: 753860
Implantable Lead Model: 158
Implantable Lead Model: 4543
Implantable Lead Model: 5076
Implantable Lead Serial Number: 148938
Implantable Lead Serial Number: 205859
Implantable Pulse Generator Implant Date: 20110721
Lead Channel Impedance Value: 514 Ohm
Lead Channel Impedance Value: 575 Ohm
Lead Channel Impedance Value: 989 Ohm
Lead Channel Pacing Threshold Amplitude: 0.5 V
Lead Channel Pacing Threshold Amplitude: 0.7 V
Lead Channel Pacing Threshold Amplitude: 1.1 V
Lead Channel Pacing Threshold Pulse Width: 0.4 ms
Lead Channel Pacing Threshold Pulse Width: 0.4 ms
Lead Channel Pacing Threshold Pulse Width: 0.4 ms
Lead Channel Setting Pacing Amplitude: 1.9 V
Lead Channel Setting Pacing Amplitude: 2 V
Lead Channel Setting Pacing Amplitude: 2.4 V
Lead Channel Setting Pacing Pulse Width: 0.4 ms
Lead Channel Setting Pacing Pulse Width: 0.4 ms
Lead Channel Setting Sensing Sensitivity: 0.5 mV
Lead Channel Setting Sensing Sensitivity: 1 mV
Pulse Gen Serial Number: 139171

## 2021-06-17 ENCOUNTER — Other Ambulatory Visit: Payer: Self-pay | Admitting: Internal Medicine

## 2021-06-17 DIAGNOSIS — I255 Ischemic cardiomyopathy: Secondary | ICD-10-CM

## 2021-06-17 NOTE — Progress Notes (Signed)
Remote ICD transmission.   

## 2021-06-28 ENCOUNTER — Telehealth: Payer: Self-pay

## 2021-06-28 NOTE — Telephone Encounter (Signed)
ICD triggered ERI 06/27/21. Patient called advised his device has reached ERI and will need apt to discuss gen change.   At last in-clinic check 04/05/21 battery estimated 9 months left. Device reached ERI 06/27/21. Consulted with Joey about early battery depletion. States he will call into tech services and follow up.

## 2021-06-29 NOTE — Telephone Encounter (Signed)
Message received in regard to battery question.  Per Joey, "Normal ERI window for patient".

## 2021-06-30 NOTE — Telephone Encounter (Signed)
Pt scheduled 07/25/2021 with Oda Kilts, PA-C

## 2021-07-08 ENCOUNTER — Ambulatory Visit (INDEPENDENT_AMBULATORY_CARE_PROVIDER_SITE_OTHER): Payer: Medicare Other

## 2021-07-08 DIAGNOSIS — I255 Ischemic cardiomyopathy: Secondary | ICD-10-CM

## 2021-07-10 LAB — CUP PACEART REMOTE DEVICE CHECK
Brady Statistic RA Percent Paced: 61 %
Brady Statistic RV Percent Paced: 99 %
Date Time Interrogation Session: 20221230135900
HighPow Impedance: 66 Ohm
Implantable Lead Implant Date: 20051207
Implantable Lead Implant Date: 20051207
Implantable Lead Implant Date: 20110721
Implantable Lead Location: 753858
Implantable Lead Location: 753859
Implantable Lead Location: 753860
Implantable Lead Model: 158
Implantable Lead Model: 4543
Implantable Lead Model: 5076
Implantable Lead Serial Number: 148938
Implantable Lead Serial Number: 205859
Implantable Pulse Generator Implant Date: 20110721
Lead Channel Impedance Value: 465 Ohm
Lead Channel Impedance Value: 541 Ohm
Lead Channel Impedance Value: 937 Ohm
Lead Channel Pacing Threshold Amplitude: 0.5 V
Lead Channel Pacing Threshold Amplitude: 0.7 V
Lead Channel Pacing Threshold Amplitude: 1.1 V
Lead Channel Pacing Threshold Pulse Width: 0.4 ms
Lead Channel Pacing Threshold Pulse Width: 0.4 ms
Lead Channel Pacing Threshold Pulse Width: 0.4 ms
Lead Channel Setting Pacing Amplitude: 1.9 V
Lead Channel Setting Pacing Amplitude: 2 V
Lead Channel Setting Pacing Amplitude: 2.4 V
Lead Channel Setting Pacing Pulse Width: 0.4 ms
Lead Channel Setting Pacing Pulse Width: 0.4 ms
Lead Channel Setting Sensing Sensitivity: 0.5 mV
Lead Channel Setting Sensing Sensitivity: 1 mV
Pulse Gen Serial Number: 139171

## 2021-07-13 ENCOUNTER — Inpatient Hospital Stay (HOSPITAL_COMMUNITY): Admission: RE | Admit: 2021-07-13 | Payer: Medicare Other | Source: Ambulatory Visit

## 2021-07-13 ENCOUNTER — Other Ambulatory Visit (HOSPITAL_COMMUNITY): Payer: Self-pay | Admitting: Internal Medicine

## 2021-07-13 DIAGNOSIS — M79605 Pain in left leg: Secondary | ICD-10-CM

## 2021-07-15 ENCOUNTER — Other Ambulatory Visit: Payer: Self-pay | Admitting: Internal Medicine

## 2021-07-20 NOTE — Addendum Note (Signed)
Addended by: Cheri Kearns A on: 07/20/2021 10:38 AM   Modules accepted: Level of Service

## 2021-07-20 NOTE — Progress Notes (Signed)
Remote ICD transmission.   

## 2021-07-22 NOTE — Progress Notes (Signed)
Electrophysiology Office Note Date: 07/22/2021  ID:  [name], DOB 1955-08-25, MRN [id number]  PCP: Ginger Organ., MD Primary Cardiologist: None Electrophysiologist: [name], MD   CC: ICD follow-up; Device at [place] is a 66 y.o. male seen today for [name], MD for routine electrophysiology followup.  Since last being seen in our clinic the patient reports doing very well.  he denies chest pain, palpitations, dyspnea, PND, orthopnea, nausea, vomiting, dizziness, syncope, edema, weight gain, or early satiety.  Device History: [employer] BiV ICD implanted 2005, gen change 2011 for chronic systolic CHF  Past Medical History:  Diagnosis Date   AICD (automatic cardioverter/defibrillator) present    CAD (coronary artery disease) last 2005   multiple MIs   CHF (congestive heart failure) ([place])    secondary to severe ischemic cardiomyopathy.   a.    EF 20% with akinesis of the distal half to two-thirds of the ventricle.  Trivial MR   b.     Status post bi-V ICD.   c.     Cardiopulmonary exercise test (12/11) pVO2 of 24.5,VE/VCO2 slope is 31,.7   Colon polyps    Diabetes mellitus    Diverticulosis    GERD (gastroesophageal reflux disease)    Headache(784.0)    Hemorrhoids    Hyperlipidemia    ICD (implantable cardiac defibrillator) in place 10/2009   shocks due to sinus tac   Insomnia    Left bundle branch block    Persistent atrial fibrillation (HCC)    Presence of permanent cardiac pacemaker    Prostate neoplasm    Tinnitus    comes and goes   Past Surgical History:  Procedure Laterality Date   CARDIAC CATHETERIZATION  2005   stent x 1 placed   CARDIOVERSION N/A 06/06/2017   Procedure: CARDIOVERSION;  Surgeon: Larey Dresser, MD;  Location: [facility];  Service: Cardiovascular;  Laterality: N/A;   COLONOSCOPY WITH PROPOFOL N/A 06/02/2019   Procedure: COLONOSCOPY WITH PROPOFOL;  Surgeon: Irene Shipper, MD;  [facility];   Service: Endoscopy;  Laterality: N/A;   colonscopy     x 2   dual chamber defibrillator  06/15/2004   HEMORRHOID BANDING  09-2012   POLYPECTOMY  06/02/2019   Procedure: POLYPECTOMY;  Surgeon: Irene Shipper, MD;  [facility];  Service: Endoscopy;;   RIGHT/LEFT HEART CATH AND CORONARY ANGIOGRAPHY N/A 07/20/2017   Procedure: RIGHT/LEFT HEART CATH AND CORONARY ANGIOGRAPHY;  Surgeon: Jolaine Artist, MD;  Location: [facility];  Service: Cardiovascular;  Laterality: N/A;   single-chamber ICD implantation  08/10/2003   TONSILLECTOMY  age 24    Current Outpatient Medications  Medication Sig Dispense Refill   acetaminophen (TYLENOL) 500 MG tablet Take 1,000 mg by mouth daily as needed for moderate pain.     aspirin EC 81 MG tablet Take 81 mg by mouth daily. Swallow whole.     Brimonidine Tartrate (LUMIFY) 0.025 % SOLN Place 1 drop into both eyes daily as needed (redness).     diphenhydrAMINE HCl (ZZZQUIL) 50 MG/30ML LIQD Take 50 mg by mouth at bedtime.     Esomeprazole Magnesium (NEXIUM 24HR) 20 MG TBEC Take 10 mg by mouth daily.     furosemide (LASIX) 40 MG tablet Take 1 tablet (40 mg total) by mouth daily. 90 tablet 2   glimepiride (AMARYL) 1 MG tablet Take 1 mg by mouth daily with breakfast.     loperamide (IMODIUM A-D) 2 MG  tablet Take 2 mg by mouth as needed for diarrhea or loose stools.     loratadine-pseudoephedrine (CLARITIN-D 24-HOUR) 10-240 MG 24 hr tablet Take 1 tablet by mouth daily as needed for allergies.     metoprolol succinate (TOPROL-XL) 100 MG 24 hr tablet TAKE 1 TABLET BY MOUTH ONCE DAILY WITH FOOD OR  IMMEDIATELY  FOLLOWING  A  MEAL 90 tablet 2   nitroGLYCERIN (NITROSTAT) 0.4 MG SL tablet Place 1 tablet (0.4 mg total) under the tongue every 5 (five) minutes as needed for chest pain. 25 tablet 6   PACERONE 100 MG tablet Take 1 tablet by mouth once daily 90 tablet 3   ramipril (ALTACE) 10 MG capsule Take 1 capsule (10 mg total) by mouth daily. 90 capsule 3    spironolactone (ALDACTONE) 25 MG tablet TAKE 1 TABLET BY MOUTH IN THE MORNING 90 tablet 2   Tetrahydrozoline HCl (VISINE OP) Place 1 drop into both eyes daily as needed (irritation).     traMADol (ULTRAM) 50 MG tablet Take 50 mg by mouth every 6 (six) hours as needed for moderate pain.     zolpidem (AMBIEN CR) 12.5 MG CR tablet Take 12.5 mg by mouth at bedtime.     No current facility-administered medications for this visit.    Allergies:   Coreg [carvedilol], Entresto [sacubitril-valsartan], Jardiance [empagliflozin], and Lipitor [atorvastatin]   Social History: Social History   Socioeconomic History   Marital status: Single    Spouse name: Not on file   Number of children: Not on file   Years of education: Not on file   Highest education level: Not on file  Occupational History   Not on file  Tobacco Use   Smoking status: Former    Packs/day: 2.00    Years: 30.00    Pack years: 60.00    Types: Cigarettes    Quit date: 07/20/2003    Years since quitting: 18.0   Smokeless tobacco: Never  Vaping Use   Vaping Use: Never used  Substance and Sexual Activity   Alcohol use: Yes    Comment: Occasional.   Drug use: Yes    Comment: admits to cocaine in the early 1980s and not since   Sexual activity: Not on file  Other Topics Concern   Not on file  Social History Narrative   Not on file   Social Determinants of Health   Financial Resource Strain: Not on file  Food Insecurity: Not on file  Transportation Needs: Not on file  Physical Activity: Not on file  Stress: Not on file  Social Connections: Not on file  Intimate Partner Violence: Not on file    Family History: Family History  Problem Relation Age of Onset   Coronary artery disease Other      Review of Systems: All other systems reviewed and are otherwise negative except as noted above.  Physical Exam: There were no vitals filed for this visit.   GEN- The patient is well appearing, alert and oriented x 3  today.   HEENT: normocephalic, atraumatic; sclera clear, conjunctiva pink; hearing intact; oropharynx clear; neck supple  Lungs- Clear to ausculation bilaterally, normal work of breathing.  No wheezes, rales, rhonchi Heart- Regular rate and rhythm, no murmurs, rubs or gallops  GI- soft, non-tender, non-distended, bowel sounds present  Extremities- no clubbing or cyanosis. No edema MS- no significant deformity or atrophy Skin- warm and dry, no rash or lesion; PPM pocket well healed Psych- euthymic mood, full affect Neuro- strength  and sensation are intact  PPM Interrogation- reviewed in detail today,  See PACEART report  EKG:  EKG is not ordered today.  Recent Labs: No results found for requested labs within last 8760 hours.   Wt Readings from Last 3 Encounters:  04/05/21 199 lb 9.6 oz (90.5 kg)  06/30/20 200 lb 12.8 oz (91.1 kg)  04/19/20 207 lb (93.9 kg)     Other studies Reviewed: Additional studies/ records that were reviewed today include: Previous EP office notes, Previous remote checks, Most recent labwork.   Assessment and Plan:  1.  Chronic systolic CHF  s/p [employer] CRT  Normal PPM function See Claudia Desanctis Art report No changes today Device at [facility] as of 06/27/2021. Risks and benefits of gen changes discussed today, this is his 3rd entry into left pocket, so will need pouch Update Echo for completeness  2. Persistent atrial fibrillation Burden low Has refused anticoagulation  3. Ventricular tachycardia Continue amiodarone 100 mg daily. Surveillance labs when returns for pre-gen change labs.   Current medicines are reviewed at length with the patient today.    Labs/ tests ordered today include:  Orders Placed This Encounter  Procedures   Comprehensive metabolic panel   TSH   ECHOCARDIOGRAM COMPLETE    Disposition:   Follow up with Dr. Caryl Comes as usual post gen change    Signed, Shirley Friar, PA-C  07/22/2021 9:59 AM  [facility] [address] [place] [address] [phone] (office) [phone] (fax)

## 2021-07-25 ENCOUNTER — Other Ambulatory Visit: Payer: Self-pay

## 2021-07-25 ENCOUNTER — Ambulatory Visit: Payer: Medicare Other | Admitting: Student

## 2021-07-25 ENCOUNTER — Encounter: Payer: Self-pay | Admitting: Student

## 2021-07-25 VITALS — BP 112/56 | HR 63 | Ht 75.5 in | Wt 197.2 lb

## 2021-07-25 DIAGNOSIS — I255 Ischemic cardiomyopathy: Secondary | ICD-10-CM

## 2021-07-25 DIAGNOSIS — I480 Paroxysmal atrial fibrillation: Secondary | ICD-10-CM

## 2021-07-25 DIAGNOSIS — I4891 Unspecified atrial fibrillation: Secondary | ICD-10-CM

## 2021-07-25 DIAGNOSIS — I5022 Chronic systolic (congestive) heart failure: Secondary | ICD-10-CM

## 2021-07-25 DIAGNOSIS — I472 Ventricular tachycardia, unspecified: Secondary | ICD-10-CM

## 2021-07-25 LAB — CUP PACEART INCLINIC DEVICE CHECK
Date Time Interrogation Session: 20230116124026
HighPow Impedance: 47 Ohm
HighPow Impedance: 70 Ohm
Implantable Lead Implant Date: 20051207
Implantable Lead Implant Date: 20051207
Implantable Lead Implant Date: 20110721
Implantable Lead Location: 753858
Implantable Lead Location: 753859
Implantable Lead Location: 753860
Implantable Lead Model: 158
Implantable Lead Model: 4543
Implantable Lead Model: 5076
Implantable Lead Serial Number: 148938
Implantable Lead Serial Number: 205859
Implantable Pulse Generator Implant Date: 20110721
Lead Channel Impedance Value: 512 Ohm
Lead Channel Impedance Value: 566 Ohm
Lead Channel Impedance Value: 987 Ohm
Lead Channel Pacing Threshold Amplitude: 0.6 V
Lead Channel Pacing Threshold Amplitude: 0.7 V
Lead Channel Pacing Threshold Amplitude: 1.1 V
Lead Channel Pacing Threshold Pulse Width: 0.4 ms
Lead Channel Pacing Threshold Pulse Width: 0.4 ms
Lead Channel Pacing Threshold Pulse Width: 0.4 ms
Lead Channel Sensing Intrinsic Amplitude: 3.9 mV
Lead Channel Setting Pacing Amplitude: 1.9 V
Lead Channel Setting Pacing Amplitude: 2 V
Lead Channel Setting Pacing Amplitude: 2.4 V
Lead Channel Setting Pacing Pulse Width: 0.4 ms
Lead Channel Setting Pacing Pulse Width: 0.4 ms
Lead Channel Setting Sensing Sensitivity: 0.5 mV
Lead Channel Setting Sensing Sensitivity: 1 mV
Pulse Gen Serial Number: 139171

## 2021-07-25 NOTE — Patient Instructions (Addendum)
Testing/Procedures: Your physician has requested that you have an echocardiogram. Echocardiography is a painless test that uses sound waves to create images of your heart. It provides your doctor with information about the size and shape of your heart and how well your hearts chambers and valves are working. This procedure takes approximately one hour. There are no restrictions for this procedure.   Medication Instructions: Your physician recommends that you continue on your current medications as directed. Please refer to the Current Medication list given to you today.  Labwork:   Your physician recommends that you return for lab work on same day as Echocardiogram BMET and CBC   Procedures/Testing: Your physician has recommended that you have a Generator Change of your device. This is a procedure that replaces a Pacemaker ICD generator that is at the end of its service life. The remaining lifespan of a pacemaker is determined during visits to the [facility]. The battery in a pacemaker does not stop suddenly but rather loses its charge slowly, which lets the cardiologist plan the replacement date.  Follow-Up: Your physician recommends that you schedule a follow-up appointment in 10 - 14 days from 09/05/2021 with the Device clinic for a wound check  Your physician recommends that you schedule a follow-up appointment in 3 months from 09/05/2021 with Dr. Caryl Comes.   If you need a refill on your cardiac medications before your next appointment, please call your pharmacy.   -------------------------------------------------------------------------------------------------------------  Please wash with the CHG Soap the night before and morning of procedure (follow instruction page "Preparing For Surgery").   Please report to the [place] [facility] (A[facility] of [facility] at 7:30am ([place], [place] [address])  DO NOT eat or drink anything after midnight  the night before procedure  Hold ALL medications the morning of your procedure.   You will need someone to drive you home after the procedure

## 2021-08-08 ENCOUNTER — Ambulatory Visit (INDEPENDENT_AMBULATORY_CARE_PROVIDER_SITE_OTHER): Payer: Medicare Other

## 2021-08-08 DIAGNOSIS — I255 Ischemic cardiomyopathy: Secondary | ICD-10-CM

## 2021-08-08 LAB — CUP PACEART REMOTE DEVICE CHECK
Brady Statistic RA Percent Paced: 79 %
Brady Statistic RV Percent Paced: 99 %
Date Time Interrogation Session: 20230130043000
HighPow Impedance: 68 Ohm
Implantable Lead Implant Date: 20051207
Implantable Lead Implant Date: 20051207
Implantable Lead Implant Date: 20110721
Implantable Lead Location: 753858
Implantable Lead Location: 753859
Implantable Lead Location: 753860
Implantable Lead Model: 158
Implantable Lead Model: 4543
Implantable Lead Model: 5076
Implantable Lead Serial Number: 148938
Implantable Lead Serial Number: 205859
Implantable Pulse Generator Implant Date: 20110721
Lead Channel Impedance Value: 468 Ohm
Lead Channel Impedance Value: 541 Ohm
Lead Channel Impedance Value: 935 Ohm
Lead Channel Pacing Threshold Amplitude: 0.6 V
Lead Channel Pacing Threshold Amplitude: 0.7 V
Lead Channel Pacing Threshold Amplitude: 1.1 V
Lead Channel Pacing Threshold Pulse Width: 0.4 ms
Lead Channel Pacing Threshold Pulse Width: 0.4 ms
Lead Channel Pacing Threshold Pulse Width: 0.4 ms
Lead Channel Setting Pacing Amplitude: 1.9 V
Lead Channel Setting Pacing Amplitude: 2 V
Lead Channel Setting Pacing Amplitude: 2.4 V
Lead Channel Setting Pacing Pulse Width: 0.4 ms
Lead Channel Setting Pacing Pulse Width: 0.4 ms
Lead Channel Setting Sensing Sensitivity: 0.5 mV
Lead Channel Setting Sensing Sensitivity: 1 mV
Pulse Gen Serial Number: 139171

## 2021-08-16 NOTE — Progress Notes (Signed)
Remote ICD transmission.   

## 2021-08-22 ENCOUNTER — Other Ambulatory Visit: Payer: Self-pay

## 2021-08-22 ENCOUNTER — Other Ambulatory Visit: Payer: Medicare Other | Admitting: *Deleted

## 2021-08-22 ENCOUNTER — Ambulatory Visit (HOSPITAL_COMMUNITY): Payer: Medicare Other | Attending: Cardiology

## 2021-08-22 ENCOUNTER — Other Ambulatory Visit: Payer: Self-pay | Admitting: *Deleted

## 2021-08-22 DIAGNOSIS — I472 Ventricular tachycardia, unspecified: Secondary | ICD-10-CM

## 2021-08-22 DIAGNOSIS — I480 Paroxysmal atrial fibrillation: Secondary | ICD-10-CM

## 2021-08-22 DIAGNOSIS — Z95810 Presence of automatic (implantable) cardiac defibrillator: Secondary | ICD-10-CM

## 2021-08-22 DIAGNOSIS — I5022 Chronic systolic (congestive) heart failure: Secondary | ICD-10-CM

## 2021-08-22 DIAGNOSIS — I255 Ischemic cardiomyopathy: Secondary | ICD-10-CM

## 2021-08-22 DIAGNOSIS — I4891 Unspecified atrial fibrillation: Secondary | ICD-10-CM

## 2021-08-22 DIAGNOSIS — Z01812 Encounter for preprocedural laboratory examination: Secondary | ICD-10-CM

## 2021-08-22 LAB — CBC
Hematocrit: 46.9 % (ref 37.5–51.0)
Hemoglobin: 15.8 g/dL (ref 13.0–17.7)
MCH: 31.8 pg (ref 26.6–33.0)
MCHC: 33.7 g/dL (ref 31.5–35.7)
MCV: 94 fL (ref 79–97)
Platelets: 209 10*3/uL (ref 150–450)
RBC: 4.97 x10E6/uL (ref 4.14–5.80)
RDW: 13.8 % (ref 11.6–15.4)
WBC: 5.4 10*3/uL (ref 3.4–10.8)

## 2021-08-22 LAB — COMPREHENSIVE METABOLIC PANEL
ALT: 16 IU/L (ref 0–44)
AST: 18 IU/L (ref 0–40)
Albumin/Globulin Ratio: 1.8 (ref 1.2–2.2)
Albumin: 4.5 g/dL (ref 3.8–4.8)
Alkaline Phosphatase: 69 IU/L (ref 44–121)
BUN/Creatinine Ratio: 15 (ref 10–24)
BUN: 15 mg/dL (ref 8–27)
Bilirubin Total: 0.6 mg/dL (ref 0.0–1.2)
CO2: 27 mmol/L (ref 20–29)
Calcium: 9.7 mg/dL (ref 8.6–10.2)
Chloride: 99 mmol/L (ref 96–106)
Creatinine, Ser: 0.99 mg/dL (ref 0.76–1.27)
Globulin, Total: 2.5 g/dL (ref 1.5–4.5)
Glucose: 81 mg/dL (ref 70–99)
Potassium: 4.8 mmol/L (ref 3.5–5.2)
Sodium: 140 mmol/L (ref 134–144)
Total Protein: 7 g/dL (ref 6.0–8.5)
eGFR: 84 mL/min/{1.73_m2} (ref >59)

## 2021-08-22 LAB — ECHOCARDIOGRAM COMPLETE
Area-P 1/2: 2.73 cm2
S' Lateral: 6.15 cm

## 2021-08-22 LAB — TSH: TSH: 4.27 u[IU]/mL (ref 0.450–4.500)

## 2021-08-22 MED ORDER — PERFLUTREN LIPID MICROSPHERE
1.0000 mL | INTRAVENOUS | Status: AC | PRN
  Administered 2021-08-22: 2 mL via INTRAVENOUS

## 2021-09-02 NOTE — Pre-Procedure Instructions (Signed)
Instructed patient on the following items: Arrival time 1130 Nothing to eat or drink after midnight No meds AM of procedure Responsible person to drive you home-yes and but doesn't have anyone to stay with you for 24 hrs. Wash with special soap night before and morning of procedure

## 2021-09-05 ENCOUNTER — Encounter (HOSPITAL_COMMUNITY)
Admission: RE | Disposition: A | Discharge status: Home / Self Care | Payer: Medicare Other | Source: Home / Self Care | Attending: Internal Medicine

## 2021-09-05 ENCOUNTER — Ambulatory Visit (HOSPITAL_COMMUNITY)
Admission: RE | Admit: 2021-09-05 | Discharge: 2021-09-05 | Disposition: A | Discharge status: Home / Self Care | Payer: Medicare Other | Attending: Internal Medicine | Admitting: Internal Medicine

## 2021-09-05 DIAGNOSIS — Z4502 Encounter for adjustment and management of automatic implantable cardiac defibrillator: Secondary | ICD-10-CM | POA: Diagnosis not present

## 2021-09-05 DIAGNOSIS — I252 Old myocardial infarction: Secondary | ICD-10-CM | POA: Insufficient documentation

## 2021-09-05 DIAGNOSIS — Z79899 Other long term (current) drug therapy: Secondary | ICD-10-CM | POA: Diagnosis not present

## 2021-09-05 DIAGNOSIS — I2510 Atherosclerotic heart disease of native coronary artery without angina pectoris: Secondary | ICD-10-CM | POA: Insufficient documentation

## 2021-09-05 DIAGNOSIS — I5022 Chronic systolic (congestive) heart failure: Secondary | ICD-10-CM | POA: Insufficient documentation

## 2021-09-05 DIAGNOSIS — I442 Atrioventricular block, complete: Secondary | ICD-10-CM | POA: Diagnosis not present

## 2021-09-05 DIAGNOSIS — E119 Type 2 diabetes mellitus without complications: Secondary | ICD-10-CM | POA: Diagnosis not present

## 2021-09-05 DIAGNOSIS — I472 Ventricular tachycardia, unspecified: Secondary | ICD-10-CM | POA: Diagnosis not present

## 2021-09-05 DIAGNOSIS — I255 Ischemic cardiomyopathy: Secondary | ICD-10-CM | POA: Diagnosis not present

## 2021-09-05 DIAGNOSIS — Z7984 Long term (current) use of oral hypoglycemic drugs: Secondary | ICD-10-CM | POA: Diagnosis not present

## 2021-09-05 DIAGNOSIS — Z955 Presence of coronary angioplasty implant and graft: Secondary | ICD-10-CM | POA: Diagnosis not present

## 2021-09-05 DIAGNOSIS — I4819 Other persistent atrial fibrillation: Secondary | ICD-10-CM | POA: Insufficient documentation

## 2021-09-05 DIAGNOSIS — Z87891 Personal history of nicotine dependence: Secondary | ICD-10-CM | POA: Insufficient documentation

## 2021-09-05 HISTORY — PX: BIV ICD GENERATOR CHANGEOUT: EP1194

## 2021-09-05 LAB — GLUCOSE, CAPILLARY: Glucose-Capillary: 127 mg/dL — ABNORMAL HIGH (ref 70–99)

## 2021-09-05 SURGERY — BIV ICD GENERATOR CHANGEOUT

## 2021-09-05 MED ORDER — SODIUM CHLORIDE 0.9 % IV SOLN
80.0000 mg | INTRAVENOUS | Status: AC
  Administered 2021-09-05: 80 mg

## 2021-09-05 MED ORDER — CEFAZOLIN SODIUM-DEXTROSE 2-4 GM/100ML-% IV SOLN
2.0000 g | INTRAVENOUS | Status: AC
  Administered 2021-09-05: 2 g via INTRAVENOUS

## 2021-09-05 MED ORDER — CEFAZOLIN SODIUM-DEXTROSE 2-4 GM/100ML-% IV SOLN
INTRAVENOUS | Status: AC
  Filled 2021-09-05: qty 100

## 2021-09-05 MED ORDER — SODIUM CHLORIDE 0.9 % IV SOLN: INTRAVENOUS | Status: DC

## 2021-09-05 MED ORDER — ACETAMINOPHEN 325 MG PO TABS: 325.0000 mg | ORAL_TABLET | ORAL | Status: DC | PRN

## 2021-09-05 MED ORDER — LIDOCAINE HCL 1 % IJ SOLN
INTRAMUSCULAR | Status: AC
  Filled 2021-09-05: qty 60

## 2021-09-05 MED ORDER — CHLORHEXIDINE GLUCONATE 4 % EX LIQD: 4.0000 "application " | Freq: Once | CUTANEOUS | Status: DC

## 2021-09-05 MED ORDER — LIDOCAINE HCL (PF) 1 % IJ SOLN
INTRAMUSCULAR | Status: DC | PRN
  Administered 2021-09-05: 60 mL

## 2021-09-05 MED ORDER — SODIUM CHLORIDE 0.9 % IV SOLN
INTRAVENOUS | Status: AC
  Filled 2021-09-05: qty 2

## 2021-09-05 MED ORDER — POVIDONE-IODINE 10 % EX SWAB
2.0000 "application " | Freq: Once | CUTANEOUS | Status: AC
  Administered 2021-09-05: 2 via TOPICAL

## 2021-09-05 SURGICAL SUPPLY — 7 items
CABLE SURGICAL S-101-97-12 (CABLE) ×1 IMPLANT
HEMOSTAT SURGICEL 2X4 FIBR (HEMOSTASIS) ×1 IMPLANT
ICD MOMENTUM G125 (ICD Generator) ×1 IMPLANT
PAD DEFIB RADIO PHYSIO CONN (PAD) ×1 IMPLANT
POUCH AIGIS-R ANTIBACT ICD (Mesh General) ×2 IMPLANT
POUCH AIGIS-R ANTIBACT ICD LRG (Mesh General) IMPLANT
TRAY PACEMAKER INSERTION (PACKS) ×1 IMPLANT

## 2021-09-05 NOTE — Progress Notes (Signed)
Patient stated that he had a cup of coffee this morning at 0730 and he has no one to stay the night with him and stated, 'I want to be knocked out for this." Scott Harding, RN made aware and stated that she would let Dr. Caryl Comes know.

## 2021-09-05 NOTE — Discharge Instructions (Signed)

## 2021-09-05 NOTE — H&P (Signed)
Patient Care Team: Ginger Organ., MD as PCP - General (Internal Medicine) Haroldine Laws, Shaune Pascal, MD as PCP - Advanced Heart Failure (Cardiology) Deboraha Sprang, MD as PCP - Electrophysiology (Cardiology) Deboraha Sprang, MD as Consulting Physician (Cardiology)   HPI  [name] is a 66 y.o. male Seen in followup for VT and CHF with ICM  initally 2005 with upgrade 12/05 CRT-D with generator replacement 2011  Multiple MI  and stents;  The patient denies chest pain, nocturnal dyspnea, orthopnea or peripheral edema.  There have been no palpitations, lightheadedness or syncope.  Chronic dyspnea         DATE TEST EF    2011 Myoview  17 %    5/14 Echo   20 %    12/18 Echo  20-25%    1/19 R/LHC   CAD nonobstruc  2/23 Echo  20-25% [place] thrombus    Date Cr K Hgb TSH LFT  12/18 1.02 3.8 15.6 4.028 18    6/20 1.19 4.0   3.46 35   2/23 0.9 4.8 15.8         Records and Results Reviewed   Past Medical History:  Diagnosis Date   AICD (automatic cardioverter/defibrillator) present    CAD (coronary artery disease) last 2005   multiple MIs   CHF (congestive heart failure) ([place])    secondary to severe ischemic cardiomyopathy.   a.    EF 20% with akinesis of the distal half to two-thirds of the ventricle.  Trivial MR   b.     Status post bi-V ICD.   c.     Cardiopulmonary exercise test (12/11) pVO2 of 24.5,VE/VCO2 slope is 31,.7   Colon polyps    Diabetes mellitus    Diverticulosis    GERD (gastroesophageal reflux disease)    Headache(784.0)    Hemorrhoids    Hyperlipidemia    ICD (implantable cardiac defibrillator) in place 10/2009   shocks due to sinus tac   Insomnia    Left bundle branch block    Persistent atrial fibrillation (HCC)    Presence of permanent cardiac pacemaker    Prostate neoplasm    Tinnitus    comes and goes    Past Surgical History:  Procedure Laterality Date   CARDIAC CATHETERIZATION  2005   stent x 1 placed   CARDIOVERSION N/A  06/06/2017   Procedure: CARDIOVERSION;  Surgeon: Larey Dresser, MD;  Location: [facility];  Service: Cardiovascular;  Laterality: N/A;   COLONOSCOPY WITH PROPOFOL N/A 06/02/2019   Procedure: COLONOSCOPY WITH PROPOFOL;  Surgeon: Irene Shipper, MD;  [facility];  Service: Endoscopy;  Laterality: N/A;   colonscopy     x 2   dual chamber defibrillator  06/15/2004   HEMORRHOID BANDING  09-2012   POLYPECTOMY  06/02/2019   Procedure: POLYPECTOMY;  Surgeon: Irene Shipper, MD;  [facility];  Service: Endoscopy;;   RIGHT/LEFT HEART CATH AND CORONARY ANGIOGRAPHY N/A 07/20/2017   Procedure: RIGHT/LEFT HEART CATH AND CORONARY ANGIOGRAPHY;  Surgeon: Jolaine Artist, MD;  Location: [facility];  Service: Cardiovascular;  Laterality: N/A;   single-chamber ICD implantation  08/10/2003   TONSILLECTOMY  age 57    Current Facility-Administered Medications  Medication Dose Route Frequency Provider Last Rate Last Admin   0.9 %  sodium chloride infusion   Intravenous Continuous Deboraha Sprang, MD       ceFAZolin (ANCEF) IVPB 2g/100 mL premix  2  g Intravenous On Call Deboraha Sprang, MD       chlorhexidine (HIBICLENS) 4 % liquid 4 application  4 application Topical Once Deboraha Sprang, MD       gentamicin (GARAMYCIN) 80 mg in sodium chloride 0.9 % 500 mL irrigation  80 mg Irrigation On Call Deboraha Sprang, MD       povidone-iodine 10 % swab 2 application  2 application Topical Once Deboraha Sprang, MD        Allergies  Allergen Reactions   Coreg [Carvedilol] Diarrhea   Entresto [Sacubitril-Valsartan] Other (See Comments)    Joint pain   Jardiance [Empagliflozin] Other (See Comments)    Possible afib, definitely cost   Lipitor [Atorvastatin] Diarrhea      Social History   Tobacco Use   Smoking status: Former    Packs/day: 2.00    Years: 30.00    Pack years: 60.00    Types: Cigarettes    Quit date: 07/20/2003    Years since quitting: 18.1   Smokeless tobacco:  Never  Vaping Use   Vaping Use: Never used  Substance Use Topics   Alcohol use: Yes    Comment: Occasional.   Drug use: Yes    Comment: admits to cocaine in the early 1980s and not since     Family History  Problem Relation Age of Onset   Coronary artery disease Other      Current Meds  Medication Sig   acetaminophen (TYLENOL) 650 MG CR tablet Take 1,300 mg by mouth 2 (two) times daily.   aspirin EC 81 MG tablet Take 81 mg by mouth daily. Swallow whole.   Brimonidine Tartrate (LUMIFY) 0.025 % SOLN Place 1 drop into both eyes 4 (four) times a week.   diphenhydrAMINE HCl (ZZZQUIL) 50 MG/30ML LIQD Take 50 mg by mouth at bedtime.   Esomeprazole Magnesium (NEXIUM 24HR) 20 MG TBEC Take 15 mg by mouth daily.   furosemide (LASIX) 40 MG tablet Take 1 tablet (40 mg total) by mouth daily.   glimepiride (AMARYL) 1 MG tablet Take 1 mg by mouth daily with breakfast.   loperamide (IMODIUM A-D) 2 MG tablet Take 2 mg by mouth as needed for diarrhea or loose stools.   metoprolol succinate (TOPROL-XL) 100 MG 24 hr tablet TAKE 1 TABLET BY MOUTH ONCE DAILY WITH FOOD OR  IMMEDIATELY  FOLLOWING  A  MEAL   PACERONE 100 MG tablet Take 1 tablet by mouth once daily   ramipril (ALTACE) 10 MG capsule Take 1 capsule (10 mg total) by mouth daily.   spironolactone (ALDACTONE) 25 MG tablet TAKE 1 TABLET BY MOUTH IN THE MORNING   Tetrahydrozoline HCl (VISINE OP) Place 1 drop into both eyes daily as needed (irritation).   zolpidem (AMBIEN CR) 12.5 MG CR tablet Take 12.5 mg by mouth at bedtime.   [DISCONTINUED] acetaminophen (TYLENOL) 500 MG tablet Take 1,000 mg by mouth daily as needed for moderate pain.     Review of Systems negative except from HPI and PMH  Physical Exam BP (!) 151/81    Pulse 62    Temp 98.1 F (36.7 C) (Oral)    Ht 6' 3.5" (1.918 m)    Wt 88 kg    SpO2 100%    BMI 23.93 kg/m  Well developed and well nourished in no acute distress HENT normal E scleral and icterus [name] Neck Supple JVP  flat; carotids brisk and full [name] to ausculation Regular rate and rhythm, no murmurs  gallops or rub Soft with active bowel sounds No clubbing cyanosis  Edema Alert and oriented, grossly normal motor and sensory function Skin Warm and Dry    Assessment and  Plan  Ischemic cardiomyopathy   High Risk Medication Surveillance-amiodarone   Congestive heart failure-chronic-systolic      Implantable defibrillator-CRT      Ventricular tachycardia   Atrial fibrillation    Tremulousness

## 2021-09-06 ENCOUNTER — Encounter (HOSPITAL_COMMUNITY): Payer: Self-pay | Admitting: Internal Medicine

## 2021-09-15 ENCOUNTER — Ambulatory Visit (INDEPENDENT_AMBULATORY_CARE_PROVIDER_SITE_OTHER): Payer: Medicare Other

## 2021-09-15 ENCOUNTER — Other Ambulatory Visit: Payer: Self-pay

## 2021-09-15 DIAGNOSIS — I255 Ischemic cardiomyopathy: Secondary | ICD-10-CM

## 2021-09-15 NOTE — Patient Instructions (Signed)
? ?  After Your ICD (Implantable Cardiac Defibrillator)    Monitor your defibrillator site for redness, swelling, and drainage. Call the device clinic at [phone] if you experience these symptoms or fever/chills.  Your incision was closed with Dermabond:  You may shower 1 day after your defibrillator implant and wash your incision with soap and water. Avoid lotions, ointments, or perfumes over your incision until it is well-healed.  You may use a hot tub or a pool after your wound check appointment if the incision is completely closed.  Your ICD is designed to protect you from life threatening heart rhythms. Because of this, you may receive a shock.   1 shock with no symptoms:  Call the office during business hours. 1 shock with symptoms (chest pain, chest pressure, dizziness, lightheadedness, shortness of breath, overall feeling unwell):  Call 911. If you experience 2 or more shocks in 24 hours:  Call 911. If you receive a shock, you should not drive.  [place] DMV - no driving for 6 months if you receive appropriate therapy from your ICD.   ICD Alerts:  Some alerts are vibratory and others beep. These are NOT emergencies. Please call our office to let us know. If this occurs at night or on weekends, it can wait until the next business day. Send a remote transmission.  If your device is capable of reading fluid status (for heart failure), you will be offered monthly monitoring to review this with you.   Remote monitoring is used to monitor your ICD from home. This monitoring is scheduled every 91 days by our office. It allows us to keep an eye on the functioning of your device to ensure it is working properly. You will routinely see your Electrophysiologist annually (more often if necessary).  

## 2021-09-26 LAB — CUP PACEART INCLINIC DEVICE CHECK
Date Time Interrogation Session: 20230309000000
HighPow Impedance: 61 Ohm
Implantable Lead Implant Date: 20051207
Implantable Lead Implant Date: 20051207
Implantable Lead Implant Date: 20110721
Implantable Lead Location: 753858
Implantable Lead Location: 753859
Implantable Lead Location: 753860
Implantable Lead Model: 158
Implantable Lead Model: 4543
Implantable Lead Model: 5076
Implantable Lead Serial Number: 148938
Implantable Lead Serial Number: 205859
Implantable Pulse Generator Implant Date: 20230227
Lead Channel Impedance Value: 502 Ohm
Lead Channel Impedance Value: 568 Ohm
Lead Channel Impedance Value: 983 Ohm
Lead Channel Pacing Threshold Amplitude: 0.6 V
Lead Channel Pacing Threshold Amplitude: 0.7 V
Lead Channel Pacing Threshold Amplitude: 1 V
Lead Channel Pacing Threshold Pulse Width: 0.4 ms
Lead Channel Pacing Threshold Pulse Width: 0.4 ms
Lead Channel Pacing Threshold Pulse Width: 0.4 ms
Lead Channel Sensing Intrinsic Amplitude: 24 mV
Lead Channel Sensing Intrinsic Amplitude: 25 mV
Lead Channel Sensing Intrinsic Amplitude: 4 mV
Lead Channel Setting Pacing Amplitude: 2 V
Lead Channel Setting Pacing Amplitude: 2 V
Lead Channel Setting Pacing Amplitude: 2.4 V
Lead Channel Setting Pacing Pulse Width: 0.4 ms
Lead Channel Setting Pacing Pulse Width: 0.4 ms
Lead Channel Setting Sensing Sensitivity: 0.5 mV
Lead Channel Setting Sensing Sensitivity: 1 mV
Pulse Gen Serial Number: 154759

## 2021-09-26 NOTE — Progress Notes (Signed)

## 2021-11-01 ENCOUNTER — Other Ambulatory Visit (HOSPITAL_COMMUNITY): Payer: Self-pay | Admitting: Internal Medicine

## 2021-11-06 ENCOUNTER — Other Ambulatory Visit (HOSPITAL_COMMUNITY): Payer: Self-pay | Admitting: Internal Medicine

## 2021-12-06 ENCOUNTER — Ambulatory Visit (INDEPENDENT_AMBULATORY_CARE_PROVIDER_SITE_OTHER): Payer: Medicare Other

## 2021-12-06 ENCOUNTER — Encounter: Payer: Self-pay | Admitting: Internal Medicine

## 2021-12-06 ENCOUNTER — Ambulatory Visit: Payer: Medicare Other | Admitting: Internal Medicine

## 2021-12-06 VITALS — BP 110/64 | HR 60 | Ht 75.5 in | Wt 193.0 lb

## 2021-12-06 DIAGNOSIS — I472 Ventricular tachycardia, unspecified: Secondary | ICD-10-CM

## 2021-12-06 DIAGNOSIS — I255 Ischemic cardiomyopathy: Secondary | ICD-10-CM

## 2021-12-06 DIAGNOSIS — Z95810 Presence of automatic (implantable) cardiac defibrillator: Secondary | ICD-10-CM

## 2021-12-06 DIAGNOSIS — I5022 Chronic systolic (congestive) heart failure: Secondary | ICD-10-CM

## 2021-12-06 DIAGNOSIS — I4891 Unspecified atrial fibrillation: Secondary | ICD-10-CM

## 2021-12-06 LAB — CUP PACEART REMOTE DEVICE CHECK
Battery Remaining Longevity: 132 mo
Battery Remaining Percentage: 100 %
Brady Statistic RA Percent Paced: 70 %
Brady Statistic RV Percent Paced: 99 %
Date Time Interrogation Session: 20230530033400
HighPow Impedance: 57 Ohm
Implantable Lead Implant Date: 20051207
Implantable Lead Implant Date: 20051207
Implantable Lead Implant Date: 20110721
Implantable Lead Location: 753858
Implantable Lead Location: 753859
Implantable Lead Location: 753860
Implantable Lead Model: 158
Implantable Lead Model: 4543
Implantable Lead Model: 5076
Implantable Lead Serial Number: 148938
Implantable Lead Serial Number: 205859
Implantable Pulse Generator Implant Date: 20230227
Lead Channel Impedance Value: 462 Ohm
Lead Channel Impedance Value: 555 Ohm
Lead Channel Impedance Value: 959 Ohm
Lead Channel Pacing Threshold Amplitude: 0.6 V
Lead Channel Pacing Threshold Amplitude: 0.6 V
Lead Channel Pacing Threshold Amplitude: 1.1 V
Lead Channel Pacing Threshold Pulse Width: 0.4 ms
Lead Channel Pacing Threshold Pulse Width: 0.4 ms
Lead Channel Pacing Threshold Pulse Width: 0.4 ms
Lead Channel Setting Pacing Amplitude: 2 V
Lead Channel Setting Pacing Amplitude: 2 V
Lead Channel Setting Pacing Amplitude: 2.4 V
Lead Channel Setting Pacing Pulse Width: 0.4 ms
Lead Channel Setting Pacing Pulse Width: 0.4 ms
Lead Channel Setting Sensing Sensitivity: 0.5 mV
Lead Channel Setting Sensing Sensitivity: 1 mV
Pulse Gen Serial Number: 154759

## 2021-12-06 NOTE — Patient Instructions (Signed)
Medication Instructions:  Your physician recommends that you continue on your current medications as directed. Please refer to the Current Medication list given to you today.  *If you need a refill on your cardiac medications before your next appointment, please call your pharmacy*   Lab Work: None ordered.  If you have labs (blood work) drawn today and your tests are completely normal, you will receive your results only by: MyChart Message (if you have MyChart) OR A paper copy in the mail If you have any lab test that is abnormal or we need to change your treatment, we will call you to review the results.   Testing/Procedures: None ordered.    Follow-Up: At [facility], you and your health needs are our priority.  As part of our continuing mission to provide you with exceptional heart care, we have created designated Provider Care Teams.  These Care Teams include your primary Cardiologist (physician) and Advanced Practice Providers (APPs -  Physician Assistants and Nurse Practitioners) who all work together to provide you with the care you need, when you need it.  We recommend signing up for the patient portal called "MyChart".  Sign up information is provided on this After Visit Summary.  MyChart is used to connect with patients for Virtual Visits (Telemedicine).  Patients are able to view lab/test results, encounter notes, upcoming appointments, etc.  Non-urgent messages can be sent to your provider as well.   To learn more about what you can do with MyChart, go to [email or web address].    Your next appointment:   9 months with Dr Klein  Important Information About Sugar       

## 2021-12-06 NOTE — Progress Notes (Signed)
Patient Care Team: Ginger Organ., MD as PCP - General (Internal Medicine) Haroldine Laws, Shaune Pascal, MD as PCP - Advanced Heart Failure (Cardiology) Deboraha Sprang, MD as PCP - Electrophysiology (Cardiology) Deboraha Sprang, MD as Consulting Physician (Cardiology)   HPI  [name] is a 66 y.o. male Seen in followup for ventricular tachycardia and congestive heart failure in the setting ischemic cardiomyopathy . He is status post CRT-D implantation;underwent generator replacement July 2011, 2/23 .  Multiple myocardial infarctions and previous stents.   Amiodarone previously for the VT as well as atrial fiibrillation-not anticoagulated --stopped by Dr. Reine Just 6/20 secondary to cost issues.  Aspirin resumed.       He reports that he is new device beeps every day 10:24 AM  Dyspnea provoked by too aggressive , no chest pain.  No swelling    Every night he typically drinks 10-12 oz of bourbon. He also takes Z-Quil and Ambien, but continues to have difficulty with falling asleep.  Cholesterol remains uncontrolled. He refuses to take cholesterol medication.  Currently he has had 3 Covid vaccinations, and will receive his 4th booster in a few weeks. Secondary issues  Patient denies symptoms of respiratory, GI intolerance, sun sensitivity, neurological symptoms attributable to amiodarone.        DATE TEST EF   2011 Myoview  17 %   5/14 Echo   20 %   12/18 Echo  20-25%   1/19 R/LHC  CAD nonobstruc   Date Cr K Hgb TSH LFT  12/18 1.02 3.8 15.6 4.028 18    6/20 1.19 4.0   3.46 35  2/23` 0.99 4.8 15.8 4.27 16        Past Medical History:  Diagnosis Date   AICD (automatic cardioverter/defibrillator) present    CAD (coronary artery disease) last 2005   multiple MIs   CHF (congestive heart failure) ([place])    secondary to severe ischemic cardiomyopathy.   a.    EF 20% with akinesis of the distal half to two-thirds of the ventricle.  Trivial MR   b.     Status post bi-V ICD.   c.      Cardiopulmonary exercise test (12/11) pVO2 of 24.5,VE/VCO2 slope is 31,.7   Colon polyps    Diabetes mellitus    Diverticulosis    GERD (gastroesophageal reflux disease)    Headache(784.0)    Hemorrhoids    Hyperlipidemia    ICD (implantable cardiac defibrillator) in place 10/2009   shocks due to sinus tac   Insomnia    Left bundle branch block    Persistent atrial fibrillation (HCC)    Presence of permanent cardiac pacemaker    Prostate neoplasm    Tinnitus    comes and goes    Past Surgical History:  Procedure Laterality Date   BIV ICD GENERATOR CHANGEOUT N/A 09/05/2021   Procedure: BIV ICD GENERATOR CHANGEOUT;  Surgeon: Deboraha Sprang, MD;  Location: [place] [facility];  Service: Cardiovascular;  Laterality: N/A;   CARDIAC CATHETERIZATION  2005   stent x 1 placed   CARDIOVERSION N/A 06/06/2017   Procedure: CARDIOVERSION;  Surgeon: Larey Dresser, MD;  Location: [facility] ENDOSCOPY;  Service: Cardiovascular;  Laterality: N/A;   COLONOSCOPY WITH PROPOFOL N/A 06/02/2019   Procedure: COLONOSCOPY WITH PROPOFOL;  Surgeon: Irene Shipper, MD;  Location: [facility] ENDOSCOPY;  Service: Endoscopy;  Laterality: N/A;   colonscopy     x 2   dual chamber defibrillator  06/15/2004   HEMORRHOID  BANDING  09-2012   POLYPECTOMY  06/02/2019   Procedure: POLYPECTOMY;  Surgeon: Irene Shipper, MD;  Location: [facility] ENDOSCOPY;  Service: Endoscopy;;   RIGHT/LEFT HEART CATH AND CORONARY ANGIOGRAPHY N/A 07/20/2017   Procedure: RIGHT/LEFT HEART CATH AND CORONARY ANGIOGRAPHY;  Surgeon: Jolaine Artist, MD;  Location: [place] [facility];  Service: Cardiovascular;  Laterality: N/A;   single-chamber ICD implantation  08/10/2003   TONSILLECTOMY  age 60    Current Outpatient Medications  Medication Sig Dispense Refill   acetaminophen (TYLENOL) 650 MG CR tablet Take 1,300 mg by mouth 2 (two) times daily.     amiodarone (PACERONE) 100 MG tablet Take 1 tablet (100 mg total) by mouth daily. NEEDS FOLLOW UP APPOINTMENT FOR  ANYMORE REFILLS 60 tablet 0   aspirin EC 81 MG tablet Take 81 mg by mouth daily. Swallow whole.     Brimonidine Tartrate (LUMIFY) 0.025 % SOLN Place 1 drop into both eyes 4 (four) times a week.     diphenhydrAMINE HCl (ZZZQUIL) 50 MG/30ML LIQD Take 50 mg by mouth at bedtime.     Esomeprazole Magnesium (NEXIUM 24HR) 20 MG TBEC Take 15 mg by mouth daily.     furosemide (LASIX) 40 MG tablet Take 1 tablet (40 mg total) by mouth daily. 90 tablet 2   loperamide (IMODIUM A-D) 2 MG tablet Take 2 mg by mouth as needed for diarrhea or loose stools.     loratadine-pseudoephedrine (CLARITIN-D 24-HOUR) 10-240 MG 24 hr tablet Take 1 tablet by mouth daily as needed for allergies.     metoprolol succinate (TOPROL-XL) 100 MG 24 hr tablet TAKE 1 TABLET BY MOUTH ONCE DAILY WITH FOOD OR  IMMEDIATELY  FOLLOWING  A  MEAL 90 tablet 2   nitroGLYCERIN (NITROSTAT) 0.4 MG SL tablet Place 1 tablet (0.4 mg total) under the tongue every 5 (five) minutes as needed for chest pain. 25 tablet 6   ramipril (ALTACE) 10 MG capsule Take 1 capsule (10 mg total) by mouth daily. NEEDS FOLLOW UP APPOINTMENT FOR ANYMORE REFILLS 90 capsule 0   spironolactone (ALDACTONE) 25 MG tablet TAKE 1 TABLET BY MOUTH IN THE MORNING 90 tablet 2   Tetrahydrozoline HCl (VISINE OP) Place 1 drop into both eyes daily as needed (irritation).     zolpidem (AMBIEN CR) 12.5 MG CR tablet Take 12.5 mg by mouth at bedtime.     glimepiride (AMARYL) 1 MG tablet Take 1 mg by mouth daily with breakfast. (Patient not taking: Reported on 12/06/2021)     No current facility-administered medications for this visit.  Reviewed  Allergies  Allergen Reactions   Coreg [Carvedilol] Diarrhea   Entresto [Sacubitril-Valsartan] Other (See Comments)    Joint pain   Jardiance [Empagliflozin] Other (See Comments)    Possible afib, definitely cost   Lipitor [Atorvastatin] Diarrhea    Review of Systems negative except from HPI and PMH  Physical Exam BP 110/64   Pulse 60   Ht  6' 3.5" (1.918 m)   Wt 193 lb (87.5 kg)   SpO2 99%   BMI 23.80 kg/m    Well developed and well nourished in no acute distress HENT normal Neck supple with JVP-flat Clear Device pocket well healed; without hematoma or erythema.  There is no tethering  Regular rate and rhythm, no  gallop No  murmur Abd-soft with active BS No Clubbing cyanosis  edema Skin-warm and dry A & Oriented  Grossly normal sensory and motor function  ECG sinus with a negative QRS lead  I and rS in lead V1      Assessment and  Plan  Ischemic cardiomyopathy  High Risk Medication Surveillance-amiodarone  Congestive heart failure-chronic-systolic     Implantable defibrillator-CRT      Ventricular tachycardia  Atrial fibrillation   Tremulousness     Without symptoms of ischemia.  Continue with aspirin 81 metoprolol 100 and as needed Nitrostat  No interval afib, continue amio 100 mg  For cardiomyopathy continue ramipril 10 and aldactone  25  Device by his report is beeping every day at 10:24 AM.  We have discussed this with [employer], there are no 24-hour PPM clocks in the device.  We will have him record what he is hearing.  It is noteworthy that his old device is at home but he says that it is another room.  In the event that we document that there is a 24-hour beeping in this device, we will need help from [employer]

## 2021-12-22 NOTE — Progress Notes (Signed)
Remote ICD transmission.   

## 2022-01-26 ENCOUNTER — Other Ambulatory Visit (HOSPITAL_COMMUNITY): Payer: Self-pay | Admitting: Internal Medicine

## 2022-01-31 ENCOUNTER — Ambulatory Visit (HOSPITAL_COMMUNITY)
Admission: RE | Admit: 2022-01-31 | Discharge: 2022-01-31 | Disposition: A | Discharge status: Home / Self Care | Payer: Medicare Other | Source: Ambulatory Visit | Attending: Internal Medicine | Admitting: Internal Medicine

## 2022-01-31 ENCOUNTER — Encounter (HOSPITAL_COMMUNITY): Payer: Self-pay | Admitting: Internal Medicine

## 2022-01-31 VITALS — BP 124/70 | HR 67 | Wt 192.2 lb

## 2022-01-31 DIAGNOSIS — R0602 Shortness of breath: Secondary | ICD-10-CM | POA: Insufficient documentation

## 2022-01-31 DIAGNOSIS — F4310 Post-traumatic stress disorder, unspecified: Secondary | ICD-10-CM | POA: Diagnosis not present

## 2022-01-31 DIAGNOSIS — I252 Old myocardial infarction: Secondary | ICD-10-CM | POA: Insufficient documentation

## 2022-01-31 DIAGNOSIS — I110 Hypertensive heart disease with heart failure: Secondary | ICD-10-CM | POA: Insufficient documentation

## 2022-01-31 DIAGNOSIS — I5022 Chronic systolic (congestive) heart failure: Secondary | ICD-10-CM | POA: Insufficient documentation

## 2022-01-31 DIAGNOSIS — I513 Intracardiac thrombosis, not elsewhere classified: Secondary | ICD-10-CM | POA: Diagnosis not present

## 2022-01-31 DIAGNOSIS — Z7982 Long term (current) use of aspirin: Secondary | ICD-10-CM | POA: Insufficient documentation

## 2022-01-31 DIAGNOSIS — Z95810 Presence of automatic (implantable) cardiac defibrillator: Secondary | ICD-10-CM | POA: Diagnosis not present

## 2022-01-31 DIAGNOSIS — Z79899 Other long term (current) drug therapy: Secondary | ICD-10-CM | POA: Insufficient documentation

## 2022-01-31 DIAGNOSIS — E119 Type 2 diabetes mellitus without complications: Secondary | ICD-10-CM | POA: Insufficient documentation

## 2022-01-31 DIAGNOSIS — I2510 Atherosclerotic heart disease of native coronary artery without angina pectoris: Secondary | ICD-10-CM | POA: Insufficient documentation

## 2022-01-31 DIAGNOSIS — Z7901 Long term (current) use of anticoagulants: Secondary | ICD-10-CM | POA: Diagnosis not present

## 2022-01-31 DIAGNOSIS — E669 Obesity, unspecified: Secondary | ICD-10-CM | POA: Diagnosis not present

## 2022-01-31 DIAGNOSIS — I471 Supraventricular tachycardia: Secondary | ICD-10-CM | POA: Insufficient documentation

## 2022-01-31 DIAGNOSIS — I480 Paroxysmal atrial fibrillation: Secondary | ICD-10-CM

## 2022-01-31 NOTE — Addendum Note (Signed)
Encounter addended by: Jerl Mina, RN on: 01/31/2022 12:33 PM  Actions taken: Clinical Note Signed

## 2022-01-31 NOTE — Patient Instructions (Signed)
No changes to your medications.  Your physician recommends that you schedule a follow-up appointment in: 1 year (July 2024) ** please call the office in April 2024 to arrange your follow up appointment **  If you have any questions or concerns before your next appointment please send [place] a message through [place] or call our office at [phone].    TO LEAVE A MESSAGE FOR THE NURSE SELECT OPTION 2, PLEASE LEAVE A MESSAGE INCLUDING: YOUR NAME DATE OF BIRTH CALL BACK NUMBER REASON FOR CALL**this is important as we prioritize the call backs  YOU WILL RECEIVE A CALL BACK THE SAME DAY AS LONG AS YOU CALL BEFORE 4:00 PM  At the [facility], you and your health needs are our priority. As part of our continuing mission to provide you with exceptional heart care, we have created designated Provider Care Teams. These Care Teams include your primary Cardiologist (physician) and Advanced Practice Providers (APPs- Physician Assistants and Nurse Practitioners) who all work together to provide you with the care you need, when you need it.   You may see any of the following providers on your designated Care Team at your next follow up: Dr Glori Bickers Dr Haynes Kerns, NP Lyda Jester, [place] [facility] [place], [place] Audry Riles, PharmD   Please be sure to bring in all your medications bottles to every appointment.

## 2022-01-31 NOTE — Progress Notes (Addendum)
Patient ID: [name], male   DOB: 03/19/1956, 66 y.o.   MRN: [id number]   ADVANCED HF CLINIC NOTE  PCP: Dr Brigitte Pulse EP: Dr Caryl Comes Cardiologist: Dr Haroldine Laws  HPI: [name] is a 66 year old male with history of chronic systolic heart failure EF 20% 2009, ICM s/p 2 myocardial infarctions and previous stents, [place] [employer] BiVICD in place, S/P device generator replacement in July 2011, DM,  obesity, HTN, and a low HDL. Disabled since 2011.   Myoview 7/11 which showed EF 17% with markedly dilated LV  EDV: 509 ml ESV: 422 ml. Extensive scar with trivial peri-infarct ischemia.  CPX  12/11 pVO2 24.5 slope 31.7 RER 1.12 O2 pulse 105%   In April 2011 received 7 ICD shocks for SVT and now has PTSD from the event.Tried increasing carvedilol in past, but did not tolerate due to diarrhea.   Echo 5/14: EF 20%. RV normal.   Dr. Brigitte Pulse started [place] and he was peeing a lot and relates it to him having AF. So he stopped.   Developed recurrent AF and underwent outpatient DC-CV on 06/06/17. He was feeling ok but developed tachycardia and he called EP and transmission showed VT. He presented to [facility] in [place] and was loaded on amiodarone which broke VT. Echo 06/09/17 EF 20-25%.   Echo 02/23: LVEF 20-25%, large apical mural thrombus, RV okay. Patient declined anticoagulation for LV thrombus.  Had ICD generator change 05/23.   Here for f/u. Has lost 52 pounds. Says he can't taste food any more after getting his dentures. Feels ok. Says he is unchanged. SOB getting to mailbox. No CP, edema, orthopnea or PND. Drinking about a pint of bourbon/day. Had a fall and hit his head recently.    ICD interrogated in clinic: No VT/AF,. HL Score 2  Activity level 4.2hr/day 99% LV pacing  Personally reviewed   Cath 1/19 Ao = 97/58 (73) LV = 97/14 RA = 1 RV = 33/3 PA = 35/6 (21) PCW = 6 Fick cardiac output/index = 5.2/2.2 PVR =2.9 WU FA sat = 95% PA sat = 66%, 67%   Assessment: 1. 3v CAD with patent RCA stent and CTO  LAD 2. ICM EF 15-20% 3. Well-compensated hemodynamics  CPX 07/13/16   FVC 4.82 (86%)      FEV1 3.40 (80%)        FEV1/FVC 71 (92%)        MVV 125 (78%)       Resting HR: 58 Peak HR: 113   (72% age predicted max HR) BP rest: 112/70 BP peak: 146/62 Peak VO2: 19.5 (64% predicted peak VO2) VE/VCO2 slope:  33 OUES: 2.24 Peak RER: 1.06  Ventilatory Threshold: 16.3 (53% predicted and 84% measured peak VO2) VE/MVV:  62% O2pulse:  18   (90% predicted O2pulse)  ROS: All systems negative except as listed in HPI, PMH and Problem List.  Past Medical History:  Diagnosis Date   AICD (automatic cardioverter/defibrillator) present    CAD (coronary artery disease) last 2005   multiple MIs   CHF (congestive heart failure) (HCC)    secondary to severe ischemic cardiomyopathy.   a.    EF 20% with akinesis of the distal half to two-thirds of the ventricle.  Trivial MR   b.     Status post bi-V ICD.   c.     Cardiopulmonary exercise test (12/11) pVO2 of 24.5,VE/VCO2 slope is 31,.7   Colon polyps    Diabetes mellitus    Diverticulosis    GERD (gastroesophageal  reflux disease)    Headache(784.0)    Hemorrhoids    Hyperlipidemia    ICD (implantable cardiac defibrillator) in place 10/2009   shocks due to sinus tac   Insomnia    Left bundle branch block    Persistent atrial fibrillation (HCC)    Presence of permanent cardiac pacemaker    Prostate neoplasm    Tinnitus    comes and goes    Current Outpatient Medications  Medication Sig Dispense Refill   acetaminophen (TYLENOL) 650 MG CR tablet Take 1,300 mg by mouth 2 (two) times daily.     amiodarone (PACERONE) 100 MG tablet Take 1 tablet (100 mg total) by mouth daily. NEEDS FOLLOW UP APPOINTMENT FOR ANYMORE REFILLS 60 tablet 0   aspirin EC 81 MG tablet Take 81 mg by mouth daily. Swallow whole.     Brimonidine Tartrate (LUMIFY) 0.025 % SOLN Place 1 drop into both eyes 4 (four) times a week.     diphenhydrAMINE HCl (ZZZQUIL) 50 MG/30ML LIQD  Take 50 mg by mouth at bedtime.     Esomeprazole Magnesium (NEXIUM 24HR) 20 MG TBEC Take 15 mg by mouth daily.     furosemide (LASIX) 40 MG tablet Take 1 tablet (40 mg total) by mouth daily. 90 tablet 2   loperamide (IMODIUM A-D) 2 MG tablet Take 2 mg by mouth as needed for diarrhea or loose stools.     loratadine-pseudoephedrine (CLARITIN-D 24-HOUR) 10-240 MG 24 hr tablet Take 1 tablet by mouth daily as needed for allergies.     metoprolol succinate (TOPROL-XL) 100 MG 24 hr tablet TAKE 1 TABLET BY MOUTH ONCE DAILY WITH FOOD OR  IMMEDIATELY  FOLLOWING  A  MEAL 90 tablet 2   nitroGLYCERIN (NITROSTAT) 0.4 MG SL tablet Place 1 tablet (0.4 mg total) under the tongue every 5 (five) minutes as needed for chest pain. 25 tablet 6   ramipril (ALTACE) 10 MG capsule Take 1 capsule (10 mg total) by mouth daily. 90 capsule 3   spironolactone (ALDACTONE) 25 MG tablet TAKE 1 TABLET BY MOUTH IN THE MORNING 90 tablet 2   Tetrahydrozoline HCl (VISINE OP) Place 1 drop into both eyes daily as needed (irritation).     zolpidem (AMBIEN CR) 12.5 MG CR tablet Take 12.5 mg by mouth at bedtime.     No current facility-administered medications for this encounter.     PHYSICAL EXAM: Vitals:   01/31/22 1129  BP: 124/70  Pulse: 67  SpO2: 98%   Filed Weights   01/31/22 1129  Weight: 87.2 kg (192 lb 3.2 oz)   General:  Well appearing. No resp difficulty HEENT: normal Neck: supple. no JVD. Carotids 2+ bilat; no bruits. No lymphadenopathy or thryomegaly appreciated. Cor: PMI nondisplaced. Regular rate & rhythm. No rubs, gallops or murmurs. Lungs: clear Abdomen: soft, nontender, nondistended. No hepatosplenomegaly. No bruits or masses. Good bowel sounds. Extremities: no cyanosis, clubbing, rash, edema Neuro: alert & orientedx3, cranial nerves grossly intact. moves all 4 extremities w/o difficulty. Affect pleasant    ECG: AV paced 60 Personally reviewed   ASSESSMENT & PLAN: 1. Chronic systolic HF due to iCM EF  20% - CPX 07/2017 and cath results very reassuring. Only mild to moderate HF limitation - Echo 12/18 EF 20-25% - Echo 02/23 EF 20-25%, large apical mural thrombus, RV okay - Stable NYHA II symptoms - Volume status ok on exam and ICD  - Continue ramipril 10 (previously on Entresto but stopped due to cost)  - Continue Toprol 100 -  Continue spiro 25 - Did not tolerate Jardiance - ICD interrogated in clinic  2. CAD - Stable by cath 1/19 - No s/s ischemia - He refuses statin due to GI upset.  - Cannot afford PCSK-9 - PCP following.   3. PAF - Status post  DC-CV 11/18. Remains in NSR - Continue amio 100 daily. Check CMET and TFTs - Previously on Eliquis but stopped due to cost. Now on ASA - Follows with Dr. Caryl Comes who encouraged him to stay on Eliquis.   4. VT - ICD interrogated as above. No VT on device  5. DM2 - Per PCP. Did not tolerate Jardiance.   6. HL - Refuses statin due to GI upset. . Consider PCSK9 inhibitor - PCP managing cholesterol.   7. LV apical thrombus - large - refuses anticoagulation (even after I showed him the picture of the clot)  Glori Bickers, MD  12:22 PM

## 2022-02-24 ENCOUNTER — Other Ambulatory Visit: Payer: Self-pay | Admitting: Internal Medicine

## 2022-03-02 ENCOUNTER — Other Ambulatory Visit (HOSPITAL_COMMUNITY): Payer: Self-pay | Admitting: Internal Medicine

## 2022-03-07 ENCOUNTER — Ambulatory Visit (INDEPENDENT_AMBULATORY_CARE_PROVIDER_SITE_OTHER): Payer: Medicare Other

## 2022-03-07 DIAGNOSIS — I255 Ischemic cardiomyopathy: Secondary | ICD-10-CM

## 2022-03-07 LAB — CUP PACEART REMOTE DEVICE CHECK
Battery Remaining Longevity: 132 mo
Battery Remaining Percentage: 100 %
Brady Statistic RA Percent Paced: 68 %
Brady Statistic RV Percent Paced: 99 %
Date Time Interrogation Session: 20230829033400
HighPow Impedance: 56 Ohm
Implantable Lead Implant Date: 20051207
Implantable Lead Implant Date: 20051207
Implantable Lead Implant Date: 20110721
Implantable Lead Location: 753858
Implantable Lead Location: 753859
Implantable Lead Location: 753860
Implantable Lead Model: 158
Implantable Lead Model: 4543
Implantable Lead Model: 5076
Implantable Lead Serial Number: 148938
Implantable Lead Serial Number: 205859
Implantable Pulse Generator Implant Date: 20230227
Lead Channel Impedance Value: 458 Ohm
Lead Channel Impedance Value: 541 Ohm
Lead Channel Impedance Value: 915 Ohm
Lead Channel Pacing Threshold Amplitude: 0.6 V
Lead Channel Pacing Threshold Amplitude: 0.7 V
Lead Channel Pacing Threshold Amplitude: 1 V
Lead Channel Pacing Threshold Pulse Width: 0.4 ms
Lead Channel Pacing Threshold Pulse Width: 0.4 ms
Lead Channel Pacing Threshold Pulse Width: 0.4 ms
Lead Channel Setting Pacing Amplitude: 2 V
Lead Channel Setting Pacing Amplitude: 2 V
Lead Channel Setting Pacing Amplitude: 2.4 V
Lead Channel Setting Pacing Pulse Width: 0.4 ms
Lead Channel Setting Pacing Pulse Width: 0.4 ms
Lead Channel Setting Sensing Sensitivity: 0.5 mV
Lead Channel Setting Sensing Sensitivity: 1 mV
Pulse Gen Serial Number: 154759

## 2022-03-16 ENCOUNTER — Other Ambulatory Visit: Payer: Self-pay | Admitting: Internal Medicine

## 2022-03-16 DIAGNOSIS — I255 Ischemic cardiomyopathy: Secondary | ICD-10-CM

## 2022-03-31 NOTE — Progress Notes (Signed)
Remote ICD transmission.   

## 2022-05-04 ENCOUNTER — Other Ambulatory Visit (HOSPITAL_COMMUNITY): Payer: Self-pay | Admitting: Internal Medicine

## 2022-05-04 ENCOUNTER — Encounter: Payer: Self-pay | Admitting: Internal Medicine

## 2022-05-31 ENCOUNTER — Other Ambulatory Visit: Payer: Self-pay | Admitting: Internal Medicine

## 2022-06-06 ENCOUNTER — Ambulatory Visit (INDEPENDENT_AMBULATORY_CARE_PROVIDER_SITE_OTHER): Payer: Medicare Other

## 2022-06-06 DIAGNOSIS — I255 Ischemic cardiomyopathy: Secondary | ICD-10-CM

## 2022-06-06 LAB — CUP PACEART REMOTE DEVICE CHECK
Battery Remaining Longevity: 132 mo
Battery Remaining Percentage: 100 %
Brady Statistic RA Percent Paced: 63 %
Brady Statistic RV Percent Paced: 100 %
Date Time Interrogation Session: 20231128032300
HighPow Impedance: 57 Ohm
Implantable Lead Connection Status: 753985
Implantable Lead Connection Status: 753985
Implantable Lead Connection Status: 753985
Implantable Lead Implant Date: 20051207
Implantable Lead Implant Date: 20051207
Implantable Lead Implant Date: 20110721
Implantable Lead Location: 753858
Implantable Lead Location: 753859
Implantable Lead Location: 753860
Implantable Lead Model: 158
Implantable Lead Model: 4543
Implantable Lead Model: 5076
Implantable Lead Serial Number: 148938
Implantable Lead Serial Number: 205859
Implantable Pulse Generator Implant Date: 20230227
Lead Channel Impedance Value: 472 Ohm
Lead Channel Impedance Value: 548 Ohm
Lead Channel Impedance Value: 905 Ohm
Lead Channel Pacing Threshold Amplitude: 0.7 V
Lead Channel Pacing Threshold Amplitude: 0.7 V
Lead Channel Pacing Threshold Amplitude: 1 V
Lead Channel Pacing Threshold Pulse Width: 0.4 ms
Lead Channel Pacing Threshold Pulse Width: 0.4 ms
Lead Channel Pacing Threshold Pulse Width: 0.4 ms
Lead Channel Setting Pacing Amplitude: 2 V
Lead Channel Setting Pacing Amplitude: 2 V
Lead Channel Setting Pacing Amplitude: 2.4 V
Lead Channel Setting Pacing Pulse Width: 0.4 ms
Lead Channel Setting Pacing Pulse Width: 0.4 ms
Lead Channel Setting Sensing Sensitivity: 0.5 mV
Lead Channel Setting Sensing Sensitivity: 1 mV
Pulse Gen Serial Number: 154759

## 2022-06-15 ENCOUNTER — Other Ambulatory Visit: Payer: Self-pay | Admitting: Internal Medicine

## 2022-06-15 DIAGNOSIS — I255 Ischemic cardiomyopathy: Secondary | ICD-10-CM

## 2022-06-15 NOTE — Telephone Encounter (Signed)
Rx refill sent to pharmacy. 

## 2022-07-06 NOTE — Progress Notes (Signed)
Remote ICD transmission.   

## 2022-09-05 ENCOUNTER — Ambulatory Visit: Payer: Medicare Other

## 2022-09-05 DIAGNOSIS — I255 Ischemic cardiomyopathy: Secondary | ICD-10-CM | POA: Diagnosis not present

## 2022-09-06 LAB — CUP PACEART REMOTE DEVICE CHECK
Battery Remaining Longevity: 132 mo
Battery Remaining Percentage: 100 %
Brady Statistic RA Percent Paced: 58 %
Brady Statistic RV Percent Paced: 100 %
Date Time Interrogation Session: 20240227032100
HighPow Impedance: 55 Ohm
Implantable Lead Connection Status: 753985
Implantable Lead Connection Status: 753985
Implantable Lead Connection Status: 753985
Implantable Lead Implant Date: 20051207
Implantable Lead Implant Date: 20051207
Implantable Lead Implant Date: 20110721
Implantable Lead Location: 753858
Implantable Lead Location: 753859
Implantable Lead Location: 753860
Implantable Lead Model: 158
Implantable Lead Model: 4543
Implantable Lead Model: 5076
Implantable Lead Serial Number: 148938
Implantable Lead Serial Number: 205859
Implantable Pulse Generator Implant Date: 20230227
Lead Channel Impedance Value: 460 Ohm
Lead Channel Impedance Value: 532 Ohm
Lead Channel Impedance Value: 966 Ohm
Lead Channel Pacing Threshold Amplitude: 0.7 V
Lead Channel Pacing Threshold Amplitude: 1.1 V
Lead Channel Pacing Threshold Amplitude: 1.2 V
Lead Channel Pacing Threshold Pulse Width: 0.4 ms
Lead Channel Pacing Threshold Pulse Width: 0.4 ms
Lead Channel Pacing Threshold Pulse Width: 0.4 ms
Lead Channel Setting Pacing Amplitude: 2 V
Lead Channel Setting Pacing Amplitude: 2 V
Lead Channel Setting Pacing Amplitude: 2.4 V
Lead Channel Setting Pacing Pulse Width: 0.4 ms
Lead Channel Setting Pacing Pulse Width: 0.4 ms
Lead Channel Setting Sensing Sensitivity: 0.5 mV
Lead Channel Setting Sensing Sensitivity: 1 mV
Pulse Gen Serial Number: 154759

## 2022-09-19 ENCOUNTER — Other Ambulatory Visit: Payer: Self-pay | Admitting: Internal Medicine

## 2022-09-19 DIAGNOSIS — I255 Ischemic cardiomyopathy: Secondary | ICD-10-CM

## 2022-10-11 NOTE — Progress Notes (Signed)
Remote ICD transmission.   

## 2022-10-31 ENCOUNTER — Encounter: Payer: Self-pay | Admitting: Internal Medicine

## 2022-10-31 ENCOUNTER — Ambulatory Visit: Payer: Medicare Other | Attending: Internal Medicine | Admitting: Internal Medicine

## 2022-10-31 ENCOUNTER — Telehealth: Payer: Self-pay

## 2022-10-31 VITALS — BP 118/68 | HR 61 | Ht 75.75 in | Wt 194.0 lb

## 2022-10-31 DIAGNOSIS — I255 Ischemic cardiomyopathy: Secondary | ICD-10-CM

## 2022-10-31 DIAGNOSIS — I5022 Chronic systolic (congestive) heart failure: Secondary | ICD-10-CM | POA: Diagnosis not present

## 2022-10-31 DIAGNOSIS — Z95810 Presence of automatic (implantable) cardiac defibrillator: Secondary | ICD-10-CM | POA: Diagnosis not present

## 2022-10-31 DIAGNOSIS — I472 Ventricular tachycardia, unspecified: Secondary | ICD-10-CM | POA: Diagnosis not present

## 2022-10-31 DIAGNOSIS — Z79899 Other long term (current) drug therapy: Secondary | ICD-10-CM

## 2022-10-31 MED ORDER — AMIODARONE HCL 200 MG PO TABS: 100.0000 mg | ORAL_TABLET | Freq: Every day | ORAL | 3 refills | Status: DC

## 2022-10-31 NOTE — Progress Notes (Signed)
Patient Care Team: Cleatis Polka., MD as PCP - General (Internal Medicine) Gala Romney, Bevelyn Buckles, MD as PCP - Advanced Heart Failure (Cardiology) Duke Salvia, MD as PCP - Electrophysiology (Cardiology) Duke Salvia, MD as Consulting Physician (Cardiology)   HPI  [name] is a 67 y.o. male Seen in followup for ventricular tachycardia and congestive heart failure in the setting ischemic cardiomyopathy . He is status post CRT-D implantation;underwent generator replacement July 2011, 2/23 .  Multiple myocardial infarctions and previous stents.   Amiodarone previously for the VT as well as atrial fiibrillation-not anticoagulated --stopped by Dr. Dorthea Cove 6/20 secondary to cost issues.  Aspirin resumed.       He reports that he is new device beeps every day 10:24 AM  Dyspnea with exertion.  No chest pain or swelling. Every night he typically drinks 10-12 oz of bourbon. He also takes Z-Quil and Ambien, but continues to have difficulty with falling asleep.-Reviewed         DATE TEST EF   2011 Myoview  17 %   5/14 Echo   20 %   12/18 Echo  20-25%   1/19 R/LHC  CAD nonobstruc   Date Cr K Hgb TSH LFT  12/18 1.02 3.8 15.6 4.028 18    6/20 1.19 4.0   3.46 35  2/23` 0.99 4.8 15.8 4.27 16        Past Medical History:  Diagnosis Date   AICD (automatic cardioverter/defibrillator) present    CAD (coronary artery disease) last 2005   multiple MIs   CHF (congestive heart failure)    secondary to severe ischemic cardiomyopathy.   a.    EF 20% with akinesis of the distal half to two-thirds of the ventricle.  Trivial MR   b.     Status post bi-V ICD.   c.     Cardiopulmonary exercise test (12/11) pVO2 of 24.5,VE/VCO2 slope is 31,.7   Colon polyps    Diabetes mellitus    Diverticulosis    GERD (gastroesophageal reflux disease)    Headache(784.0)    Hemorrhoids    Hyperlipidemia    ICD (implantable cardiac defibrillator) in place 10/2009   shocks due to sinus tac   Insomnia     Left bundle branch block    Persistent atrial fibrillation    Presence of permanent cardiac pacemaker    Prostate neoplasm    Tinnitus    comes and goes    Past Surgical History:  Procedure Laterality Date   BIV ICD GENERATOR CHANGEOUT N/A 09/05/2021   Procedure: BIV ICD GENERATOR CHANGEOUT;  Surgeon: Duke Salvia, MD;  [facility] INVASIVE CV LAB;  Service: Cardiovascular;  Laterality: N/A;   CARDIAC CATHETERIZATION  2005   stent x 1 placed   CARDIOVERSION N/A 06/06/2017   Procedure: CARDIOVERSION;  Surgeon: Laurey Morale, MD;  Location: [facility] ENDOSCOPY;  Service: Cardiovascular;  Laterality: N/A;   COLONOSCOPY WITH PROPOFOL N/A 06/02/2019   Procedure: COLONOSCOPY WITH PROPOFOL;  Surgeon: Hilarie Fredrickson, MD;  Location: [facility] ENDOSCOPY;  Service: Endoscopy;  Laterality: N/A;   colonscopy     x 2   dual chamber defibrillator  06/15/2004   HEMORRHOID BANDING  09-2012   POLYPECTOMY  06/02/2019   Procedure: POLYPECTOMY;  Surgeon: Hilarie Fredrickson, MD;  Location: [facility] ENDOSCOPY;  Service: Endoscopy;;   RIGHT/LEFT HEART CATH AND CORONARY ANGIOGRAPHY N/A 07/20/2017   Procedure: RIGHT/LEFT HEART CATH AND CORONARY ANGIOGRAPHY;  Surgeon: Dolores Patty, MD;  Location:  [facility] INVASIVE CV LAB;  Service: Cardiovascular;  Laterality: N/A;   single-chamber ICD implantation  08/10/2003   TONSILLECTOMY  age 60    Current Outpatient Medications  Medication Sig Dispense Refill   acetaminophen (TYLENOL) 650 MG CR tablet Take 1,300 mg by mouth 2 (two) times daily.     amiodarone (PACERONE) 200 MG tablet Take 1 tablet (200 mg total) by mouth daily. 90 tablet 3   aspirin EC 81 MG tablet Take 81 mg by mouth daily. Swallow whole.     Brimonidine Tartrate (LUMIFY) 0.025 % SOLN Place 1 drop into both eyes 4 (four) times a week.     diphenhydrAMINE HCl (ZZZQUIL) 50 MG/30ML LIQD Take 50 mg by mouth at bedtime.     Esomeprazole Magnesium (NEXIUM 24HR) 20 MG TBEC Take 15 mg by mouth daily.     furosemide (LASIX) 40  MG tablet Take 1 tablet by mouth once daily 90 tablet 0   loperamide (IMODIUM A-D) 2 MG tablet Take 2 mg by mouth as needed for diarrhea or loose stools.     loratadine-pseudoephedrine (CLARITIN-D 24-HOUR) 10-240 MG 24 hr tablet Take 1 tablet by mouth daily as needed for allergies.     metoprolol succinate (TOPROL-XL) 100 MG 24 hr tablet Take 1 tablet (100 mg total) by mouth daily. 90 tablet 0   nitroGLYCERIN (NITROSTAT) 0.4 MG SL tablet Place 1 tablet (0.4 mg total) under the tongue every 5 (five) minutes as needed for chest pain. 25 tablet 6   ramipril (ALTACE) 10 MG capsule Take 1 capsule (10 mg total) by mouth daily. 90 capsule 3   spironolactone (ALDACTONE) 25 MG tablet Take 1 tablet by mouth once daily 90 tablet 0   Tetrahydrozoline HCl (VISINE OP) Place 1 drop into both eyes daily as needed (irritation).     zolpidem (AMBIEN CR) 12.5 MG CR tablet Take 12.5 mg by mouth at bedtime.     No current facility-administered medications for this visit.  Reviewed  Allergies  Allergen Reactions   Coreg [Carvedilol] Diarrhea   Entresto [Sacubitril-Valsartan] Other (See Comments)    Joint pain   Jardiance [Empagliflozin] Other (See Comments)    Possible afib, definitely cost   Lipitor [Atorvastatin] Diarrhea    Review of Systems negative except from HPI and PMH  Physical Exam BP 118/68   Pulse 61   Ht 6' 3.75" (1.924 m)   Wt 194 lb (88 kg)   SpO2 99%   BMI 23.77 kg/m  Well developed and well nourished in no acute distress HENT normal Neck supple with JVP-flat Clear Device pocket well healed; without hematoma or erythema.  There is no tethering  Regular rate and rhythm, no  murmur Abd-soft with active BS No Clubbing cyanosis  edema Skin-warm and dry A & Oriented  Grossly normal sensory and motor function  ECG sinus @ P-synchronous/ AV  pacing   Device function is  normal. Programming changes none  See Paceart for details    ECG sinus with a negative QRS lead I and rS in lead  V1      Assessment and  Plan  Ischemic cardiomyopathy  High Risk Medication Surveillance-amiodarone  Congestive heart failure-chronic-systolic     Implantable defibrillator-CRT      Ventricular tachycardia  Atrial fibrillation   Tremulousness     Dyspnea on exertion.  Heart rate excursion is limited.  Not interested in device reprogramming.  Continue amiodarone.  Need amiodarone surveillance labs.  Also continue metoprolol and Aldactone for his  cardiomyopathy  Little bit of edema.  Will continue furosemide 40 mg daily  As he walked out of the office he mentioned that he never wanted to get shocked again, that he would rather be killed.  In this regard, I offered him the opportunity to think again about whether we inactivate high-voltage therapy.  He will consider it.

## 2022-10-31 NOTE — Telephone Encounter (Signed)
The patient states he did not have blood work done [employer]. He misspoken. He would like for Dr. Graciela Husbands nurse to give him a call back to see if he needs to come tomorrow for blood work.

## 2022-10-31 NOTE — Telephone Encounter (Signed)
Spoke with pt and advised per Dr Graciela Husbands TSH and Liver panel is due.  Appointment scheduled for 11/01/2022.  Pt verbalizes understanding and thanked Charity fundraiser for the call.

## 2022-10-31 NOTE — Patient Instructions (Signed)
Medication Instructions:  Your physician recommends that you continue on your current medications as directed. Please refer to the Current Medication list given to you today.  *If you need a refill on your cardiac medications before your next appointment, please call your pharmacy*   Lab Work: None ordered.  If you have labs (blood work) drawn today and your tests are completely normal, you will receive your results only by: MyChart Message (if you have MyChart) OR A paper copy in the mail If you have any lab test that is abnormal or we need to change your treatment, we will call you to review the results.   Testing/Procedures: None ordered.    Follow-Up: At [facility], you and your health needs are our priority.  As part of our continuing mission to provide you with exceptional heart care, we have created designated Provider Care Teams.  These Care Teams include your primary Cardiologist (physician) and Advanced Practice Providers (APPs -  Physician Assistants and Nurse Practitioners) who all work together to provide you with the care you need, when you need it.  We recommend signing up for the patient portal called "MyChart".  Sign up information is provided on this After Visit Summary.  MyChart is used to connect with patients for Virtual Visits (Telemedicine).  Patients are able to view lab/test results, encounter notes, upcoming appointments, etc.  Non-urgent messages can be sent to your provider as well.   To learn more about what you can do with MyChart, go to [email or web address].    Your next appointment:   12 months with Dr Klein 

## 2022-11-01 ENCOUNTER — Ambulatory Visit: Payer: Medicare Other | Attending: Internal Medicine

## 2022-11-01 DIAGNOSIS — I472 Ventricular tachycardia, unspecified: Secondary | ICD-10-CM

## 2022-11-01 DIAGNOSIS — I5022 Chronic systolic (congestive) heart failure: Secondary | ICD-10-CM

## 2022-11-01 DIAGNOSIS — Z95810 Presence of automatic (implantable) cardiac defibrillator: Secondary | ICD-10-CM

## 2022-11-01 DIAGNOSIS — Z79899 Other long term (current) drug therapy: Secondary | ICD-10-CM

## 2022-11-01 DIAGNOSIS — I255 Ischemic cardiomyopathy: Secondary | ICD-10-CM

## 2022-11-02 LAB — HEPATIC FUNCTION PANEL
ALT: 17 IU/L (ref 0–44)
AST: 21 IU/L (ref 0–40)
Albumin: 4.2 g/dL (ref 3.9–4.9)
Alkaline Phosphatase: 52 IU/L (ref 44–121)
Bilirubin Total: 0.5 mg/dL (ref 0.0–1.2)
Bilirubin, Direct: 0.16 mg/dL (ref 0.00–0.40)
Total Protein: 6.5 g/dL (ref 6.0–8.5)

## 2022-11-02 LAB — TSH: TSH: 3.78 u[IU]/mL (ref 0.450–4.500)

## 2022-11-30 ENCOUNTER — Encounter: Payer: Medicare Other | Admitting: Internal Medicine

## 2022-12-05 ENCOUNTER — Ambulatory Visit (INDEPENDENT_AMBULATORY_CARE_PROVIDER_SITE_OTHER): Payer: Medicare Other

## 2022-12-05 DIAGNOSIS — I255 Ischemic cardiomyopathy: Secondary | ICD-10-CM | POA: Diagnosis not present

## 2022-12-06 ENCOUNTER — Other Ambulatory Visit: Payer: Self-pay | Admitting: Internal Medicine

## 2022-12-06 LAB — CUP PACEART REMOTE DEVICE CHECK
Battery Remaining Longevity: 132 mo
Battery Remaining Percentage: 100 %
Brady Statistic RA Percent Paced: 45 %
Brady Statistic RV Percent Paced: 99 %
Date Time Interrogation Session: 20240528032100
HighPow Impedance: 55 Ohm
Implantable Lead Connection Status: 753985
Implantable Lead Connection Status: 753985
Implantable Lead Connection Status: 753985
Implantable Lead Implant Date: 20051207
Implantable Lead Implant Date: 20051207
Implantable Lead Implant Date: 20110721
Implantable Lead Location: 753858
Implantable Lead Location: 753859
Implantable Lead Location: 753860
Implantable Lead Model: 158
Implantable Lead Model: 4543
Implantable Lead Model: 5076
Implantable Lead Serial Number: 148938
Implantable Lead Serial Number: 205859
Implantable Pulse Generator Implant Date: 20230227
Lead Channel Impedance Value: 440 Ohm
Lead Channel Impedance Value: 548 Ohm
Lead Channel Impedance Value: 970 Ohm
Lead Channel Pacing Threshold Amplitude: 0.7 V
Lead Channel Pacing Threshold Amplitude: 0.8 V
Lead Channel Pacing Threshold Amplitude: 1.2 V
Lead Channel Pacing Threshold Pulse Width: 0.4 ms
Lead Channel Pacing Threshold Pulse Width: 0.4 ms
Lead Channel Pacing Threshold Pulse Width: 0.4 ms
Lead Channel Setting Pacing Amplitude: 2 V
Lead Channel Setting Pacing Amplitude: 2 V
Lead Channel Setting Pacing Amplitude: 2.4 V
Lead Channel Setting Pacing Pulse Width: 0.4 ms
Lead Channel Setting Pacing Pulse Width: 0.4 ms
Lead Channel Setting Sensing Sensitivity: 0.5 mV
Lead Channel Setting Sensing Sensitivity: 1 mV
Pulse Gen Serial Number: 154759

## 2022-12-26 ENCOUNTER — Other Ambulatory Visit: Payer: Self-pay

## 2022-12-26 DIAGNOSIS — I255 Ischemic cardiomyopathy: Secondary | ICD-10-CM

## 2022-12-26 MED ORDER — FUROSEMIDE 40 MG PO TABS
40.0000 mg | ORAL_TABLET | Freq: Every day | ORAL | 3 refills | Status: DC
Start: 2022-12-26 — End: 2023-12-27

## 2022-12-26 MED ORDER — SPIRONOLACTONE 25 MG PO TABS
25.0000 mg | ORAL_TABLET | Freq: Every day | ORAL | 3 refills | Status: DC
Start: 2022-12-26 — End: 2024-01-03

## 2022-12-26 NOTE — Addendum Note (Signed)
Addended by: Margaret Pyle D on: 12/26/2022 01:39 PM   Modules accepted: Orders

## 2022-12-29 NOTE — Progress Notes (Signed)
Remote ICD transmission.   

## 2023-02-07 ENCOUNTER — Other Ambulatory Visit (HOSPITAL_COMMUNITY): Payer: Self-pay | Admitting: Internal Medicine

## 2023-03-06 ENCOUNTER — Ambulatory Visit (INDEPENDENT_AMBULATORY_CARE_PROVIDER_SITE_OTHER): Payer: Medicare Other

## 2023-03-06 DIAGNOSIS — I255 Ischemic cardiomyopathy: Secondary | ICD-10-CM

## 2023-03-06 DIAGNOSIS — I5022 Chronic systolic (congestive) heart failure: Secondary | ICD-10-CM

## 2023-03-06 LAB — CUP PACEART REMOTE DEVICE CHECK
Battery Remaining Longevity: 126 mo
Battery Remaining Percentage: 100 %
Brady Statistic RA Percent Paced: 49 %
Brady Statistic RV Percent Paced: 99 %
Date Time Interrogation Session: 20240827032100
HighPow Impedance: 53 Ohm
Implantable Lead Connection Status: 753985
Implantable Lead Connection Status: 753985
Implantable Lead Connection Status: 753985
Implantable Lead Implant Date: 20051207
Implantable Lead Implant Date: 20051207
Implantable Lead Implant Date: 20110721
Implantable Lead Location: 753858
Implantable Lead Location: 753859
Implantable Lead Location: 753860
Implantable Lead Model: 158
Implantable Lead Model: 4543
Implantable Lead Model: 5076
Implantable Lead Serial Number: 148938
Implantable Lead Serial Number: 205859
Implantable Pulse Generator Implant Date: 20230227
Lead Channel Impedance Value: 421 Ohm
Lead Channel Impedance Value: 492 Ohm
Lead Channel Impedance Value: 992 Ohm
Lead Channel Pacing Threshold Amplitude: 0.7 V
Lead Channel Pacing Threshold Amplitude: 0.8 V
Lead Channel Pacing Threshold Amplitude: 1.3 V
Lead Channel Pacing Threshold Pulse Width: 0.4 ms
Lead Channel Pacing Threshold Pulse Width: 0.4 ms
Lead Channel Pacing Threshold Pulse Width: 0.4 ms
Lead Channel Setting Pacing Amplitude: 2 V
Lead Channel Setting Pacing Amplitude: 2 V
Lead Channel Setting Pacing Amplitude: 2.4 V
Lead Channel Setting Pacing Pulse Width: 0.4 ms
Lead Channel Setting Pacing Pulse Width: 0.4 ms
Lead Channel Setting Sensing Sensitivity: 0.5 mV
Lead Channel Setting Sensing Sensitivity: 1 mV
Pulse Gen Serial Number: 154759

## 2023-03-19 NOTE — Progress Notes (Signed)
Remote ICD transmission.   

## 2023-05-06 ENCOUNTER — Other Ambulatory Visit (HOSPITAL_COMMUNITY): Payer: Self-pay | Admitting: Internal Medicine

## 2023-05-20 ENCOUNTER — Other Ambulatory Visit (HOSPITAL_COMMUNITY): Payer: Self-pay | Admitting: Internal Medicine

## 2023-06-05 ENCOUNTER — Ambulatory Visit: Payer: Medicare Other

## 2023-06-05 DIAGNOSIS — I255 Ischemic cardiomyopathy: Secondary | ICD-10-CM

## 2023-06-05 DIAGNOSIS — I5022 Chronic systolic (congestive) heart failure: Secondary | ICD-10-CM

## 2023-06-05 LAB — CUP PACEART REMOTE DEVICE CHECK
Battery Remaining Longevity: 126 mo
Battery Remaining Percentage: 100 %
Brady Statistic RA Percent Paced: 50 %
Brady Statistic RV Percent Paced: 99 %
Date Time Interrogation Session: 20241126032100
HighPow Impedance: 56 Ohm
Implantable Lead Connection Status: 753985
Implantable Lead Connection Status: 753985
Implantable Lead Connection Status: 753985
Implantable Lead Implant Date: 20051207
Implantable Lead Implant Date: 20051207
Implantable Lead Implant Date: 20110721
Implantable Lead Location: 753858
Implantable Lead Location: 753859
Implantable Lead Location: 753860
Implantable Lead Model: 158
Implantable Lead Model: 4543
Implantable Lead Model: 5076
Implantable Lead Serial Number: 148938
Implantable Lead Serial Number: 205859
Implantable Pulse Generator Implant Date: 20230227
Lead Channel Impedance Value: 1040 Ohm
Lead Channel Impedance Value: 441 Ohm
Lead Channel Impedance Value: 549 Ohm
Lead Channel Pacing Threshold Amplitude: 0.7 V
Lead Channel Pacing Threshold Amplitude: 0.9 V
Lead Channel Pacing Threshold Amplitude: 1.3 V
Lead Channel Pacing Threshold Pulse Width: 0.4 ms
Lead Channel Pacing Threshold Pulse Width: 0.4 ms
Lead Channel Pacing Threshold Pulse Width: 0.4 ms
Lead Channel Setting Pacing Amplitude: 2 V
Lead Channel Setting Pacing Amplitude: 2 V
Lead Channel Setting Pacing Amplitude: 2.4 V
Lead Channel Setting Pacing Pulse Width: 0.4 ms
Lead Channel Setting Pacing Pulse Width: 0.4 ms
Lead Channel Setting Sensing Sensitivity: 0.5 mV
Lead Channel Setting Sensing Sensitivity: 1 mV
Pulse Gen Serial Number: 154759

## 2023-06-10 ENCOUNTER — Other Ambulatory Visit: Payer: Self-pay | Admitting: Internal Medicine

## 2023-07-05 NOTE — Progress Notes (Signed)
Remote ICD transmission.   

## 2023-07-12 ENCOUNTER — Other Ambulatory Visit (HOSPITAL_COMMUNITY): Payer: Self-pay | Admitting: Internal Medicine

## 2023-08-13 ENCOUNTER — Encounter (HOSPITAL_COMMUNITY): Payer: Self-pay | Admitting: Internal Medicine

## 2023-08-13 ENCOUNTER — Ambulatory Visit (HOSPITAL_COMMUNITY)
Admission: RE | Admit: 2023-08-13 | Discharge: 2023-08-13 | Disposition: A | Discharge status: Home / Self Care | Payer: Medicare Other | Source: Ambulatory Visit | Attending: Internal Medicine | Admitting: Internal Medicine

## 2023-08-13 VITALS — BP 120/60 | HR 60 | Wt 197.0 lb

## 2023-08-13 DIAGNOSIS — I2510 Atherosclerotic heart disease of native coronary artery without angina pectoris: Secondary | ICD-10-CM | POA: Insufficient documentation

## 2023-08-13 DIAGNOSIS — I110 Hypertensive heart disease with heart failure: Secondary | ICD-10-CM | POA: Insufficient documentation

## 2023-08-13 DIAGNOSIS — F1010 Alcohol abuse, uncomplicated: Secondary | ICD-10-CM | POA: Insufficient documentation

## 2023-08-13 DIAGNOSIS — Z955 Presence of coronary angioplasty implant and graft: Secondary | ICD-10-CM | POA: Insufficient documentation

## 2023-08-13 DIAGNOSIS — I4891 Unspecified atrial fibrillation: Secondary | ICD-10-CM

## 2023-08-13 DIAGNOSIS — I4819 Other persistent atrial fibrillation: Secondary | ICD-10-CM | POA: Insufficient documentation

## 2023-08-13 DIAGNOSIS — I472 Ventricular tachycardia, unspecified: Secondary | ICD-10-CM

## 2023-08-13 DIAGNOSIS — Z95810 Presence of automatic (implantable) cardiac defibrillator: Secondary | ICD-10-CM | POA: Insufficient documentation

## 2023-08-13 DIAGNOSIS — E119 Type 2 diabetes mellitus without complications: Secondary | ICD-10-CM | POA: Insufficient documentation

## 2023-08-13 DIAGNOSIS — E669 Obesity, unspecified: Secondary | ICD-10-CM | POA: Insufficient documentation

## 2023-08-13 DIAGNOSIS — I5022 Chronic systolic (congestive) heart failure: Secondary | ICD-10-CM | POA: Diagnosis present

## 2023-08-13 DIAGNOSIS — R9431 Abnormal electrocardiogram [ECG] [EKG]: Secondary | ICD-10-CM | POA: Diagnosis not present

## 2023-08-13 DIAGNOSIS — I255 Ischemic cardiomyopathy: Secondary | ICD-10-CM | POA: Diagnosis present

## 2023-08-13 DIAGNOSIS — I513 Intracardiac thrombosis, not elsewhere classified: Secondary | ICD-10-CM | POA: Diagnosis not present

## 2023-08-13 DIAGNOSIS — I252 Old myocardial infarction: Secondary | ICD-10-CM | POA: Diagnosis present

## 2023-08-13 NOTE — Patient Instructions (Signed)
Your physician recommends that you schedule a follow-up appointment in: 1 year with an echocardiogram

## 2023-08-13 NOTE — Progress Notes (Addendum)
Patient ID: [name], male   DOB: [birth year], 68 y.o.   MRN: [id number]   ADVANCED HF CLINIC NOTE  PCP: Dr [name] EP: Dr [name] Cardiologist: Dr [name]  HPI: [name] is a 68 year old male with history of chronic systolic heart failure EF 20% 4098, ICM s/p 2 myocardial infarctions and previous stents, [place] Sci BiVICD in place, S/P device generator replacement in July 2011, DM,  obesity, HTN, and a low HDL. Disabled since 2011.   Myoview 7/11 which showed EF 17% with markedly dilated LV  EDV: 509 ml ESV: 422 ml. Extensive scar with trivial peri-infarct ischemia.  CPX  12/11 pVO2 24.5 slope 31.7 RER 1.12 O2 pulse 105%   In April 2011 received 7 ICD shocks for SVT and now has PTSD from the event.Tried increasing carvedilol in past, but did not tolerate due to diarrhea.   Echo 5/14: EF 20%. RV normal.   Dr. Clelia [name] started [place] and he was peeing a lot and relates it to him having AF. So he stopped.   Developed recurrent AF and underwent outpatient DC-CV on 06/06/17. He was feeling ok but developed tachycardia and he called EP and transmission showed VT. He presented to [facility] in [place] and was loaded on amiodarone which broke VT. Echo 06/09/17 EF 20-25%.   Echo 02/23: LVEF 20-25%, large apical mural thrombus, RV okay. Patient declined anticoagulation for LV thrombus.  Had ICD generator change 05/23.   Here for f/u. Says he is about the same.Feels his endurance is way down after he lost 52 pounds last year. Can do ADls but fatigued easily. No edema, orthopne or PND. No energy or drive to do better. Smoking 3 cigars/day drinking 1 pint a day of bourbon.   ICD interrogated in clinic: HL score 25 No VT/AF. Volume was up and now it is down. S3 was up and now better. Activity level 1.9h/day LV paced 99% Personally reviewed   Cath 1/19 Ao = 97/58 (73) LV = 97/14 RA = 1 RV = 33/3 PA = 35/6 (21) PCW = 6 Fick cardiac output/index = 5.2/2.2 PVR =2.9 WU FA sat = 95% PA sat = 66%, 67%    Assessment: 1. 3v CAD with patent RCA stent and CTO LAD 2. ICM EF 15-20% 3. Well-compensated hemodynamics  CPX 07/13/16   FVC 4.82 (86%)      FEV1 3.40 (80%)        FEV1/FVC 71 (92%)        MVV 125 (78%)       Resting HR: 58 Peak HR: 113   (72% age predicted max HR) BP rest: 112/70 BP peak: 146/62 Peak VO2: 19.5 (64% predicted peak VO2) VE/VCO2 slope:  33 OUES: 2.24 Peak RER: 1.06  Ventilatory Threshold: 16.3 (53% predicted and 84% measured peak VO2) VE/MVV:  62% O2pulse:  18   (90% predicted O2pulse)  ROS: All systems negative except as listed in HPI, PMH and Problem List.  Past Medical History:  Diagnosis Date   AICD (automatic cardioverter/defibrillator) present    CAD (coronary artery disease) last 2005   multiple MIs   CHF (congestive heart failure) (HCC)    secondary to severe ischemic cardiomyopathy.   a.    EF 20% with akinesis of the distal half to two-thirds of the ventricle.  Trivial MR   b.     Status post bi-V ICD.   c.     Cardiopulmonary exercise test (12/11) pVO2 of 24.5,VE/VCO2 slope is 31,.7   Colon polyps  Diabetes mellitus    Diverticulosis    GERD (gastroesophageal reflux disease)    Headache(784.0)    Hemorrhoids    Hyperlipidemia    ICD (implantable cardiac defibrillator) in place 10/2009   shocks due to sinus tac   Insomnia    Left bundle branch block    Persistent atrial fibrillation (HCC)    Presence of permanent cardiac pacemaker    Prostate neoplasm    Tinnitus    comes and goes    Current Outpatient Medications  Medication Sig Dispense Refill   acetaminophen (TYLENOL) 650 MG CR tablet Take 1,300 mg by mouth 2 (two) times daily.     amiodarone (PACERONE) 200 MG tablet Take 1 tablet by mouth once daily 90 tablet 0   aspirin EC 81 MG tablet Take 81 mg by mouth daily. Swallow whole.     Brimonidine Tartrate (LUMIFY) 0.025 % SOLN Place 1 drop into both eyes 4 (four) times a week.     diphenhydrAMINE HCl (ZZZQUIL) 50 MG/30ML LIQD Take 50  mg by mouth at bedtime.     Esomeprazole Magnesium (NEXIUM 24HR) 20 MG TBEC Take 15 mg by mouth daily.     furosemide (LASIX) 40 MG tablet Take 1 tablet (40 mg total) by mouth daily. 90 tablet 3   loperamide (IMODIUM A-D) 2 MG tablet Take 2 mg by mouth as needed for diarrhea or loose stools.     loratadine-pseudoephedrine (CLARITIN-D 24-HOUR) 10-240 MG 24 hr tablet Take 1 tablet by mouth daily as needed for allergies.     metoprolol succinate (TOPROL-XL) 100 MG 24 hr tablet Take 1 tablet by mouth once daily 90 tablet 1   nitroGLYCERIN (NITROSTAT) 0.4 MG SL tablet Place 1 tablet (0.4 mg total) under the tongue every 5 (five) minutes as needed for chest pain. 25 tablet 6   ramipril (ALTACE) 10 MG capsule TAKE 1 CAPSULE BY MOUTH ONCE DAILY . APPOINTMENT REQUIRED FOR FUTURE REFILLS 60 capsule 0   spironolactone (ALDACTONE) 25 MG tablet Take 1 tablet (25 mg total) by mouth daily. 90 tablet 3   zolpidem (AMBIEN CR) 12.5 MG CR tablet Take 12.5 mg by mouth at bedtime.     No current facility-administered medications for this encounter.     PHYSICAL EXAM: Vitals:   08/13/23 1138  BP: 120/60  Pulse: 60  SpO2: 96%    Filed Weights   08/13/23 1138  Weight: 89.4 kg (197 lb)    General:  Well appearing. No resp difficulty HEENT: normal Neck: supple. no JVD. Carotids 2+ bilat; no bruits. No lymphadenopathy or thryomegaly appreciated. Cor: PMI nondisplaced. Regular rate & rhythm. No rubs, gallops or murmurs. Lungs: clear Abdomen: soft, nontender, nondistended. No hepatosplenomegaly. No bruits or masses. Good bowel sounds. Extremities: no cyanosis, clubbing, rash, edema Neuro: alert & orientedx3, cranial nerves grossly intact. moves all 4 extremities w/o difficulty. Affect pleasant + tremor  ECG: AV paced 60 Personally reviewed  ASSESSMENT & PLAN: 1. Chronic systolic HF due to iCM EF 20% - CPX 07/2017 and cath results very reassuring. Only mild to moderate HF limitation - Echo 12/18 EF  20-25% - Echo 02/23 EF 20-25%, large apical mural thrombus, RV okay - Progressive NYHA III symptoms - Volume was up now back down on ICD. Continue lasix 40 daily - Continue ramipril 10 (previously on Entresto but stopped due to cost)  - Continue Toprol 100 - Continue spiro 25 - Did not tolerate Jardiance - ICD interrogated in Clinic. As above  - Not  candidate for advanced therapy (and not interested in it either) due to ETOH - Not interested in cardiac rehab  2. CAD - Stable by cath 1/19 - No s/s ischemia - He refuses statin due to GI upset.  - Cannot afford [facility]-9 - PCP following lipids  3. PAF - Status post  DC-CV 11/18. Remains in NSR - Continue amio 100 daily. - Previously on Eliquis but doesn't want to take any more - Followed by Dr. Graciela [name]  - Had labwork with Dr. Clelia [name] recently  4. VT - ICD interrogated personally. No recent VT  5. DM2 - Per PCP.Did not tolerate Jardaince  6. LV apical thrombus - has refused AC  7. ETOH abuse - we discussed.  - he is not interested in cessation at this point   Arvilla Meres, MD  11:57 AM

## 2023-08-13 NOTE — Addendum Note (Signed)
Encounter addended by: Noralee Space, RN on: 08/13/2023 12:14 PM  Actions taken: Clinical Note Signed

## 2023-09-04 ENCOUNTER — Ambulatory Visit (INDEPENDENT_AMBULATORY_CARE_PROVIDER_SITE_OTHER): Payer: Medicare Other

## 2023-09-04 DIAGNOSIS — I255 Ischemic cardiomyopathy: Secondary | ICD-10-CM | POA: Diagnosis not present

## 2023-09-05 LAB — CUP PACEART REMOTE DEVICE CHECK
Battery Remaining Longevity: 126 mo
Battery Remaining Percentage: 100 %
Brady Statistic RA Percent Paced: 43 %
Brady Statistic RV Percent Paced: 99 %
Date Time Interrogation Session: 20250225032100
HighPow Impedance: 60 Ohm
Implantable Lead Connection Status: 753985
Implantable Lead Connection Status: 753985
Implantable Lead Connection Status: 753985
Implantable Lead Implant Date: 20051207
Implantable Lead Implant Date: 20051207
Implantable Lead Implant Date: 20110721
Implantable Lead Location: 753858
Implantable Lead Location: 753859
Implantable Lead Location: 753860
Implantable Lead Model: 158
Implantable Lead Model: 4543
Implantable Lead Model: 5076
Implantable Lead Serial Number: 148938
Implantable Lead Serial Number: 205859
Implantable Pulse Generator Implant Date: 20230227
Lead Channel Impedance Value: 471 Ohm
Lead Channel Impedance Value: 528 Ohm
Lead Channel Impedance Value: 931 Ohm
Lead Channel Pacing Threshold Amplitude: 0.7 V
Lead Channel Pacing Threshold Amplitude: 0.8 V
Lead Channel Pacing Threshold Amplitude: 1.2 V
Lead Channel Pacing Threshold Pulse Width: 0.4 ms
Lead Channel Pacing Threshold Pulse Width: 0.4 ms
Lead Channel Pacing Threshold Pulse Width: 0.4 ms
Lead Channel Setting Pacing Amplitude: 2 V
Lead Channel Setting Pacing Amplitude: 2 V
Lead Channel Setting Pacing Amplitude: 2.4 V
Lead Channel Setting Pacing Pulse Width: 0.4 ms
Lead Channel Setting Pacing Pulse Width: 0.4 ms
Lead Channel Setting Sensing Sensitivity: 0.5 mV
Lead Channel Setting Sensing Sensitivity: 1 mV
Pulse Gen Serial Number: 154759

## 2023-09-11 ENCOUNTER — Other Ambulatory Visit (HOSPITAL_COMMUNITY): Payer: Self-pay | Admitting: Internal Medicine

## 2023-09-25 ENCOUNTER — Encounter: Payer: Self-pay | Admitting: Internal Medicine

## 2023-10-11 NOTE — Progress Notes (Signed)
 Remote ICD transmission.

## 2023-10-11 NOTE — Addendum Note (Signed)
 Addended by: Elease Etienne A on: 10/11/2023 11:13 AM   Modules accepted: Orders

## 2023-11-16 ENCOUNTER — Other Ambulatory Visit (HOSPITAL_COMMUNITY): Payer: Self-pay | Admitting: Internal Medicine

## 2023-12-04 ENCOUNTER — Ambulatory Visit (INDEPENDENT_AMBULATORY_CARE_PROVIDER_SITE_OTHER): Payer: Medicare Other

## 2023-12-04 DIAGNOSIS — I255 Ischemic cardiomyopathy: Secondary | ICD-10-CM

## 2023-12-04 DIAGNOSIS — I5022 Chronic systolic (congestive) heart failure: Secondary | ICD-10-CM

## 2023-12-05 ENCOUNTER — Ambulatory Visit: Payer: Self-pay | Admitting: Cardiology

## 2023-12-05 LAB — CUP PACEART REMOTE DEVICE CHECK
Battery Remaining Longevity: 120 mo
Battery Remaining Percentage: 100 %
Brady Statistic RA Percent Paced: 40 %
Brady Statistic RV Percent Paced: 99 %
Date Time Interrogation Session: 20250527032200
HighPow Impedance: 53 Ohm
Implantable Lead Connection Status: 753985
Implantable Lead Connection Status: 753985
Implantable Lead Connection Status: 753985
Implantable Lead Implant Date: 20051207
Implantable Lead Implant Date: 20051207
Implantable Lead Implant Date: 20110721
Implantable Lead Location: 753858
Implantable Lead Location: 753859
Implantable Lead Location: 753860
Implantable Lead Model: 158
Implantable Lead Model: 4543
Implantable Lead Model: 5076
Implantable Lead Serial Number: 148938
Implantable Lead Serial Number: 205859
Implantable Pulse Generator Implant Date: 20230227
Lead Channel Impedance Value: 429 Ohm
Lead Channel Impedance Value: 510 Ohm
Lead Channel Impedance Value: 947 Ohm
Lead Channel Pacing Threshold Amplitude: 0.7 V
Lead Channel Pacing Threshold Amplitude: 1 V
Lead Channel Pacing Threshold Amplitude: 1.4 V
Lead Channel Pacing Threshold Pulse Width: 0.4 ms
Lead Channel Pacing Threshold Pulse Width: 0.4 ms
Lead Channel Pacing Threshold Pulse Width: 0.4 ms
Lead Channel Setting Pacing Amplitude: 2 V
Lead Channel Setting Pacing Amplitude: 2 V
Lead Channel Setting Pacing Amplitude: 2.4 V
Lead Channel Setting Pacing Pulse Width: 0.4 ms
Lead Channel Setting Pacing Pulse Width: 0.4 ms
Lead Channel Setting Sensing Sensitivity: 0.5 mV
Lead Channel Setting Sensing Sensitivity: 1 mV
Pulse Gen Serial Number: 154759

## 2023-12-26 ENCOUNTER — Other Ambulatory Visit: Payer: Self-pay | Admitting: Internal Medicine

## 2023-12-26 DIAGNOSIS — I255 Ischemic cardiomyopathy: Secondary | ICD-10-CM

## 2024-01-01 ENCOUNTER — Other Ambulatory Visit: Payer: Self-pay | Admitting: Internal Medicine

## 2024-01-01 DIAGNOSIS — I255 Ischemic cardiomyopathy: Secondary | ICD-10-CM

## 2024-01-04 ENCOUNTER — Telehealth: Payer: Self-pay

## 2024-01-04 NOTE — Telephone Encounter (Signed)
 The pt wanted to make an appointment with Dr. Fernande. I let the pt know he has been reassigned to Dr. Kennyth. I told him the scheduler will give him a call back. He would like to be scheduled in August.

## 2024-01-22 NOTE — Progress Notes (Signed)
 Remote ICD transmission.

## 2024-01-22 NOTE — Addendum Note (Signed)
 Addended by: TAWNI DRILLING D on: 01/22/2024 05:14 PM   Modules accepted: Orders

## 2024-01-23 ENCOUNTER — Other Ambulatory Visit: Payer: Self-pay | Admitting: Internal Medicine

## 2024-01-23 DIAGNOSIS — I255 Ischemic cardiomyopathy: Secondary | ICD-10-CM

## 2024-01-28 ENCOUNTER — Other Ambulatory Visit: Payer: Self-pay | Admitting: Internal Medicine

## 2024-01-28 DIAGNOSIS — I255 Ischemic cardiomyopathy: Secondary | ICD-10-CM

## 2024-01-29 NOTE — Telephone Encounter (Signed)
 This is a CHF pt

## 2024-02-17 NOTE — Progress Notes (Unsigned)
 Electrophysiology Office Note:   Date:  02/18/2024  ID:  [name], DOB 1955/10/03, MRN [id number]  Primary Cardiologist: None Electrophysiologist: Fonda Kitty, MD      History of Present Illness:   [name] is a 68 y.o. male with h/o chronic systolic heart failure secondary to ICM s/p 2 myocardial infarctions and previous stents, s/p [place] Sci BiV-ICD in place, DM, obesity, HTN, VT, atrial fibrillation who is being seen today for ICD follow up.  Discussed the use of AI scribe software for clinical note transcription with the patient, who gave verbal consent to proceed.  History of Present Illness [name] is a 68 year old male with atrial fibrillation and ventricular tachycardia who presents for follow-up regarding his cardiac condition and medication management.  He has a history of atrial fibrillation and ventricular tachycardia, experiencing episodes of rapid heart rate, with one instance reaching 160 beats per minute, for which he underwent cardioversion. He has an implanted cardiac device that has previously shocked him multiple times in a short period due to arrhythmias.  He is currently taking amiodarone , half a tablet daily, to manage his arrhythmias. A full tablet causes significant tremors, rendering him unable to perform daily tasks such as using a keyboard or phone. He has opted to continue with the half tablet despite the side effects.  He has lost 65 pounds over the past year, primarily due to reduced food intake following dental issues that led to the extraction of his teeth and the use of dentures. He reports that food has lost its flavor and eating has become a chore, contributing to his weight loss.  He has a history of diabetes and previously took statins, which he believes contributed to his diabetes. He has a history of atrial fibrillation but has previously declined blood thinners due to concerns about bleeding risks.  He has not had any atrial  fibrillation since his last device check.  He experiences mild exertional dyspnea, noting that walking uphill to his mailbox causes him to breathe harder than usual, but he does not experience orthopnea or paroxysmal nocturnal dyspnea. He sleeps approximately four hours per night and does not wake up gasping for breath.  No new or acute complaints today.   Review of systems complete and found to be negative unless listed in HPI.   EP Information / Studies Reviewed:    EKG is ordered today. Personal review as below.  EKG Interpretation Date/Time:  Monday February 18 2024 10:13:27 EDT Ventricular Rate:  61 PR Interval:    QRS Duration:  190 QT Interval:  522 QTC Calculation: 525 R Axis:   264  Text Interpretation: Ventricular-paced rhythm Biventricular pacemaker detected When compared with ECG of 13-Aug-2023 11:49, No significant change was found Confirmed by Kitty Fonda [phone]) on 02/18/2024 10:55:08 AM   Echo 08/2021:   1. Large apical laminated mural thrombus (6 x 4 cm).   2. Large apical laminated mural thrombus (6 x 4 cm). Left ventricular  ejection fraction, by estimation, is 20 to 25%. The left ventricle has  severely decreased function. The left ventricle demonstrates global  hypokinesis. The left ventricular internal  cavity size was severely dilated. Left ventricular diastolic parameters  are consistent with Grade I diastolic dysfunction (impaired relaxation).   3. Right ventricular systolic function is normal. The right ventricular  size is normal.   4. The mitral valve is normal in structure. Trivial mitral valve  regurgitation. No evidence of mitral stenosis.   5. The aortic valve  is normal in structure. Aortic valve regurgitation is  not visualized. No aortic stenosis is present.   6. The inferior vena cava is normal in size with greater than 50%  respiratory variability, suggesting right atrial pressure of 3 mmHg.    Physical Exam:   VS:  BP 120/64 (BP Location:  Left Arm, Patient Position: Sitting, Cuff Size: Normal)   Pulse 61   Ht 6' 3.75 (1.924 m)   Wt 190 lb 8 oz (86.4 kg)   SpO2 98%   BMI 23.34 kg/m    Wt Readings from Last 3 Encounters:  02/18/24 190 lb 8 oz (86.4 kg)  08/13/23 197 lb (89.4 kg)  10/31/22 194 lb (88 kg)     GEN: Well nourished, well developed in no acute distress NECK: No JVD CARDIAC: Normal rate, regular rhythm.  Well-healed left chest ICD pocket. RESPIRATORY:  Clear to auscultation without rales, wheezing or rhonchi  ABDOMEN: Soft, non-distended EXTREMITIES:  No edema; No deformity   ASSESSMENT AND PLAN:    #S/p CRT-D:  - In clinic device interrogation was performed today.  Appropriate device function and stable lead parameters.  Please see Paceart for full report. - Continue remote monitoring.  #VT  #High risk medication use: Amiodarone .  Corrected QT interval approximately 450 ms. - Continue amiodarone  100 mg daily.  Reports tremors at higher dose. - Check LFTs and TSH today.  #Paroxysmal atrial fibrillation: AF burden 0% since last check. #Secondary hypercoagulable state due to AF:  - Declines anticoagulation.  Risk and benefits of anticoagulation for stroke prophylaxis have been discussed. -Continue amiodarone  as above. - Continue monitoring of AF burden closely with device.  #Chronic systolic heart failure: Well compensated on exam today. #Ischemic cardiomyopathy:  -Continue GDMT regimen of metoprolol  XL, ramipril , spironolactone .  Volume management with Lasix . -Continue close follow-up with HF clinic.   Follow up with Dr. Kennyth in 12 months  Signed, Fonda Kennyth, MD

## 2024-02-18 ENCOUNTER — Other Ambulatory Visit: Payer: Self-pay

## 2024-02-18 ENCOUNTER — Encounter: Payer: Self-pay | Admitting: Cardiology

## 2024-02-18 ENCOUNTER — Ambulatory Visit: Attending: Cardiology | Admitting: Cardiology

## 2024-02-18 VITALS — BP 120/64 | HR 61 | Ht 75.75 in | Wt 190.5 lb

## 2024-02-18 DIAGNOSIS — I480 Paroxysmal atrial fibrillation: Secondary | ICD-10-CM

## 2024-02-18 DIAGNOSIS — Z79899 Other long term (current) drug therapy: Secondary | ICD-10-CM | POA: Diagnosis not present

## 2024-02-18 DIAGNOSIS — Z95810 Presence of automatic (implantable) cardiac defibrillator: Secondary | ICD-10-CM

## 2024-02-18 DIAGNOSIS — I472 Ventricular tachycardia, unspecified: Secondary | ICD-10-CM

## 2024-02-18 DIAGNOSIS — I5022 Chronic systolic (congestive) heart failure: Secondary | ICD-10-CM | POA: Diagnosis not present

## 2024-02-18 DIAGNOSIS — D6869 Other thrombophilia: Secondary | ICD-10-CM

## 2024-02-18 DIAGNOSIS — I255 Ischemic cardiomyopathy: Secondary | ICD-10-CM

## 2024-02-18 LAB — CUP PACEART INCLINIC DEVICE CHECK
Date Time Interrogation Session: 20250811131642
HighPow Impedance: 62 Ohm
Implantable Lead Connection Status: 753985
Implantable Lead Connection Status: 753985
Implantable Lead Connection Status: 753985
Implantable Lead Implant Date: 20051207
Implantable Lead Implant Date: 20051207
Implantable Lead Implant Date: 20110721
Implantable Lead Location: 753858
Implantable Lead Location: 753859
Implantable Lead Location: 753860
Implantable Lead Model: 158
Implantable Lead Model: 4543
Implantable Lead Model: 5076
Implantable Lead Serial Number: 148938
Implantable Lead Serial Number: 205859
Implantable Pulse Generator Implant Date: 20230227
Lead Channel Impedance Value: 1034 Ohm
Lead Channel Impedance Value: 482 Ohm
Lead Channel Impedance Value: 556 Ohm
Lead Channel Pacing Threshold Amplitude: 0.8 V
Lead Channel Pacing Threshold Amplitude: 0.8 V
Lead Channel Pacing Threshold Amplitude: 1.2 V
Lead Channel Pacing Threshold Pulse Width: 0.4 ms
Lead Channel Pacing Threshold Pulse Width: 0.4 ms
Lead Channel Pacing Threshold Pulse Width: 0.4 ms
Lead Channel Sensing Intrinsic Amplitude: 3.7 mV
Lead Channel Setting Pacing Amplitude: 2 V
Lead Channel Setting Pacing Amplitude: 2 V
Lead Channel Setting Pacing Amplitude: 2.4 V
Lead Channel Setting Pacing Pulse Width: 0.4 ms
Lead Channel Setting Pacing Pulse Width: 0.4 ms
Lead Channel Setting Sensing Sensitivity: 0.5 mV
Lead Channel Setting Sensing Sensitivity: 1 mV
Pulse Gen Serial Number: 154759

## 2024-02-18 NOTE — Patient Instructions (Signed)
 Medication Instructions:  Your physician recommends that you continue on your current medications as directed. Please refer to the Current Medication list given to you today.  *If you need a refill on your cardiac medications before your next appointment, please call your pharmacy*  Lab Work: TODAY: LFTs and TSH  Follow-Up: At [facility], you and your health needs are our priority.  As part of our continuing mission to provide you with exceptional heart care, our providers are all part of one team.  This team includes your primary Cardiologist (physician) and Advanced Practice Providers or APPs (Physician Assistants and Nurse Practitioners) who all work together to provide you with the care you need, when you need it.  Your next appointment:   1 year  Provider:   You may see Fonda Kitty, MD or one of the following Advanced Practice Providers on your designated Care Team:   Charlies Arthur, PA-C Michael Andy Tillery, PA-C Suzann Riddle, NP Daphne Barrack, NP

## 2024-02-19 LAB — HEPATIC FUNCTION PANEL
ALT: 26 IU/L (ref 0–44)
AST: 23 IU/L (ref 0–40)
Albumin: 4.7 g/dL (ref 3.9–4.9)
Alkaline Phosphatase: 48 IU/L (ref 44–121)
Bilirubin Total: 0.7 mg/dL (ref 0.0–1.2)
Bilirubin, Direct: 0.22 mg/dL (ref 0.00–0.40)
Total Protein: 7.1 g/dL (ref 6.0–8.5)

## 2024-02-19 LAB — TSH: TSH: 3.41 u[IU]/mL (ref 0.450–4.500)

## 2024-02-20 ENCOUNTER — Ambulatory Visit: Payer: Self-pay | Admitting: Cardiology

## 2024-02-20 ENCOUNTER — Other Ambulatory Visit: Payer: Self-pay | Admitting: Internal Medicine

## 2024-02-20 DIAGNOSIS — I255 Ischemic cardiomyopathy: Secondary | ICD-10-CM

## 2024-02-25 ENCOUNTER — Other Ambulatory Visit: Payer: Self-pay | Admitting: Internal Medicine

## 2024-02-25 DIAGNOSIS — I255 Ischemic cardiomyopathy: Secondary | ICD-10-CM

## 2024-03-04 ENCOUNTER — Ambulatory Visit (INDEPENDENT_AMBULATORY_CARE_PROVIDER_SITE_OTHER): Payer: Medicare Other

## 2024-03-04 DIAGNOSIS — I255 Ischemic cardiomyopathy: Secondary | ICD-10-CM | POA: Diagnosis not present

## 2024-03-05 LAB — CUP PACEART REMOTE DEVICE CHECK
Battery Remaining Longevity: 114 mo
Battery Remaining Percentage: 100 %
Brady Statistic RA Percent Paced: 42 %
Brady Statistic RV Percent Paced: 99 %
Date Time Interrogation Session: 20250826032100
HighPow Impedance: 51 Ohm
Implantable Lead Connection Status: 753985
Implantable Lead Connection Status: 753985
Implantable Lead Connection Status: 753985
Implantable Lead Implant Date: 20051207
Implantable Lead Implant Date: 20051207
Implantable Lead Implant Date: 20110721
Implantable Lead Location: 753858
Implantable Lead Location: 753859
Implantable Lead Location: 753860
Implantable Lead Model: 158
Implantable Lead Model: 4543
Implantable Lead Model: 5076
Implantable Lead Serial Number: 148938
Implantable Lead Serial Number: 205859
Implantable Pulse Generator Implant Date: 20230227
Lead Channel Impedance Value: 414 Ohm
Lead Channel Impedance Value: 469 Ohm
Lead Channel Impedance Value: 960 Ohm
Lead Channel Pacing Threshold Amplitude: 0.7 V
Lead Channel Pacing Threshold Amplitude: 1 V
Lead Channel Pacing Threshold Amplitude: 1.4 V
Lead Channel Pacing Threshold Pulse Width: 0.4 ms
Lead Channel Pacing Threshold Pulse Width: 0.4 ms
Lead Channel Pacing Threshold Pulse Width: 0.4 ms
Lead Channel Setting Pacing Amplitude: 2 V
Lead Channel Setting Pacing Amplitude: 2 V
Lead Channel Setting Pacing Amplitude: 2.4 V
Lead Channel Setting Pacing Pulse Width: 0.4 ms
Lead Channel Setting Pacing Pulse Width: 0.4 ms
Lead Channel Setting Sensing Sensitivity: 0.5 mV
Lead Channel Setting Sensing Sensitivity: 1 mV
Pulse Gen Serial Number: 154759

## 2024-03-07 ENCOUNTER — Other Ambulatory Visit (HOSPITAL_COMMUNITY): Payer: Self-pay | Admitting: Internal Medicine

## 2024-03-09 ENCOUNTER — Ambulatory Visit: Payer: Self-pay | Admitting: Cardiology

## 2024-03-12 ENCOUNTER — Telehealth (HOSPITAL_COMMUNITY): Payer: Self-pay

## 2024-03-12 NOTE — Progress Notes (Signed)
 Patient ID: [name], male   DOB: February 07, 1956, 68 y.o.   MRN: [id number]   ADVANCED HF CLINIC NOTE  PCP: Dr Loreli EP: Dr Kennyth  Cardiologist: Dr Cherrie  HPI: [name] is a 68 year old male with history of chronic systolic heart failure EF 20% 7990, ICM s/p 2 myocardial infarctions and previous stents, [place] Sci BiVICD in place, S/P device generator replacement in July 2011, DM,  obesity, HTN, and a low HDL. Disabled since 2011.   Myoview 7/11 which showed EF 17% with markedly dilated LV  EDV: 509 ml ESV: 422 ml. Extensive scar with trivial peri-infarct ischemia.  CPX  12/11 pVO2 24.5 slope 31.7 RER 1.12 O2 pulse 105%   In April 2011 received 7 ICD shocks for SVT and now has PTSD from the event.Tried increasing carvedilol  in past, but did not tolerate due to diarrhea.   Echo 5/14: EF 20%. RV normal.   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 So he stopped.   Developed recurrent AF and underwent outpatient DC-CV on 06/06/17. He was feeling ok but developed tachycardia and he called EP and transmission showed VT. He presented to [facility] in [place] and was loaded on amiodarone  which broke VT. Echo 06/09/17 EF 20-25%.   Echo 02/23: LVEF 20-25%, large apical mural thrombus, RV okay. Patient declined anticoagulation for LV thrombus.  Had ICD generator change 05/23.   Today he returns for HF follow up.Overall feeling fine. Denies SOB/PND/Orthopnea. No chest pain.  Able to complete yard work with rest breaks. Appetite ok. No fever or chills. He has lost 50 pounds over the last year. Taking all medications. Drinks 1 pint Bourbon per day.    ICD interrogated in clinic: HL =11 Activity 1.7 hours   LV = 97/14 RA = 1 RV = 33/3 PA = 35/6 (21) PCW = 6 Fick cardiac output/index = 5.2/2.2 PVR =2.9 WU FA sat = 95% PA sat = 66%, 67%   Assessment: 1. 3v CAD with patent RCA stent and CTO LAD 2. ICM EF 15-20% 3. Well-compensated hemodynamics  CPX 07/13/16    FVC 4.82 (86%)      FEV1 3.40 (80%)        FEV1/FVC 71 (92%)        MVV 125 (78%)       Resting HR: 58 Peak HR: 113   (72% age predicted max HR) BP rest: 112/70 BP peak: 146/62 Peak VO2: 19.5 (64% predicted peak VO2) VE/VCO2 slope:  33 OUES: 2.24 Peak RER: 1.06  Ventilatory Threshold: 16.3 (53% predicted and 84% measured peak VO2) VE/MVV:  62% O2pulse:  18   (90% predicted O2pulse)  ROS: All systems negative except as listed in HPI, PMH and Problem List.  Past Medical History:  Diagnosis Date   AICD (automatic cardioverter/defibrillator) present    CAD (coronary artery disease) last 2005   multiple MIs   CHF (congestive heart failure) (HCC)    secondary to severe ischemic cardiomyopathy.   a.    EF 20% with akinesis of the distal half to two-thirds of the ventricle.  Trivial MR   b.     Status post bi-V ICD.   c.     Cardiopulmonary exercise test (12/11) pVO2 of 24.5,VE/VCO2 slope is 31,.7   Colon polyps    Diabetes mellitus    Diverticulosis    GERD (gastroesophageal reflux disease)    Headache(784.0)    Hemorrhoids    Hyperlipidemia  ICD (implantable cardiac defibrillator) in place 10/2009   shocks due to sinus tac   Insomnia    Left bundle branch block    Persistent atrial fibrillation (HCC)    Presence of permanent cardiac pacemaker    Prostate neoplasm    Tinnitus    comes and goes    Current Outpatient Medications  Medication Sig Dispense Refill   acetaminophen  (TYLENOL  8 HOUR) 650 MG CR tablet Take 1,300 mg by mouth daily. 3 tablets in the PM     amiodarone  (PACERONE ) 200 MG tablet Take 100 mg by mouth daily.     aspirin  EC 81 MG tablet Take 81 mg by mouth daily. Swallow whole.     Brimonidine Tartrate (LUMIFY) 0.025 % SOLN Place 1 drop into both eyes 4 (four) times a week.     cetirizine (ZYRTEC) 10 MG chewable tablet Chew 10 mg by mouth daily.     diphenhydrAMINE HCl (ZZZQUIL) 50 MG/30ML LIQD Take 50 mg by mouth at bedtime.     esomeprazole (NEXIUM) 20  MG capsule Take 20 mg by mouth daily at 12 noon.     furosemide  (LASIX ) 40 MG tablet Take 1 tablet (40 mg total) by mouth daily. 90 tablet 3   loperamide (IMODIUM A-D) 2 MG tablet Take 2 mg by mouth as needed for diarrhea or loose stools.     Melatonin-Theanine (MELATONIN PLUS L-THEANINE PO) Take 2 capsules by mouth at bedtime.     metoprolol  succinate (TOPROL -XL) 100 MG 24 hr tablet Take 50 mg by mouth daily. Take with or immediately following a meal.     Misc Natural Products (MULTI-VEGETABLE PO) Take 5 tablets by mouth daily.     nitroGLYCERIN  (NITROSTAT ) 0.4 MG SL tablet Place 1 tablet (0.4 mg total) under the tongue every 5 (five) minutes as needed for chest pain. 25 tablet 6   Nutritional Supplements (JOINT FORMULA PO) Take 2 tablets by mouth daily.     ramipril  (ALTACE ) 10 MG capsule Take 1 capsule (10 mg total) by mouth daily. 90 capsule 0   spironolactone  (ALDACTONE ) 25 MG tablet TAKE 1 TABLET BY MOUTH ONCE DAILY . APPOINTMENT REQUIRED FOR FUTURE REFILLS 30 tablet 0   zolpidem  (AMBIEN  CR) 12.5 MG CR tablet Take 12.5 mg by mouth at bedtime.     No current facility-administered medications for this encounter.     PHYSICAL EXAM: Vitals:   03/13/24 1125  BP: 110/60  Pulse: 62  SpO2: 98%     Filed Weights   03/13/24 1125  Weight: 87 kg (191 lb 12.8 oz)   Wt Readings from Last 3 Encounters:  03/13/24 87 kg (191 lb 12.8 oz)  02/18/24 86.4 kg (190 lb 8 oz)  08/13/23 89.4 kg (197 lb)     General:   No resp difficulty Neck: no JVD.  Cor: Regular rate & rhythm.  Lungs: clear Abdomen: soft, nontender, nondistended.  Extremities: no  edema Neuro: alert & oriented x3  ASSESSMENT & PLAN: 1. Chronic systolic HF due to iCM EF 20% - CPX 07/2017 and cath results very reassuring. Only mild to moderate HF limitation - Echo 12/18 EF 20-25% - Echo 02/23 EF 20-25%, large apical mural thrombus, RV okay -NYHA II. Appears euvolemic.  Continue lasix  40 daily - Continue ramipril  10  (previously on Entresto  but stopped due to cost) . - Continue Toprol  50 mg daily  - Continue spiro 25 - Did not tolerate Jardiance - Not candidate for advanced therapy (and not interested in it  either) due to ETOH - Not interested in cardiac rehab - Refill sent in for ramipril  and Toprol  X>   2. CAD - Stable by cath 1/19 -No chest pain.  - He refuses statin due to GI upset.  - Cannot afford [facility]-9 - PCP following lipids  3. PAF - Status post  DC-CV 11/18. Remains in NSR - Continue amio 100 daily. TSH/LFTs stable.  - Previously on Eliquis  but doesn't want to take any more - Followed by Dr. Kennyth.   4. VT - ICD interrogated personally. No recent VT On amio as noted above. Recent TSH/LFTs stable.   5. DM2 - Per PCP.Did not tolerate Jardaince  6. LV apical thrombus - has refused AC  7. ETOH abuse Discussed cessation.   Follow up in 6 months. SABRA Scott Mosses, NP  11:40 AM

## 2024-03-12 NOTE — Telephone Encounter (Addendum)
 Called to confirm/remind patient of their appointment at the [facility] on 03/13/2024 11:00.   Appointment:   [x] Confirmed  [] Left mess   [] No answer/No voice mail  [] VM Full/unable to leave message  [] Phone not in service  Patient reminded to bring all medications and/or complete list.  Confirmed patient has transportation. Gave directions, instructed to utilize valet parking.

## 2024-03-13 ENCOUNTER — Ambulatory Visit (HOSPITAL_COMMUNITY)
Admission: RE | Admit: 2024-03-13 | Discharge: 2024-03-13 | Disposition: A | Discharge status: Home / Self Care | Source: Ambulatory Visit | Attending: Adult Health | Admitting: Adult Health

## 2024-03-13 ENCOUNTER — Encounter (HOSPITAL_COMMUNITY): Payer: Self-pay

## 2024-03-13 VITALS — BP 110/60 | HR 62 | Wt 191.8 lb

## 2024-03-13 DIAGNOSIS — Z86718 Personal history of other venous thrombosis and embolism: Secondary | ICD-10-CM | POA: Insufficient documentation

## 2024-03-13 DIAGNOSIS — F1010 Alcohol abuse, uncomplicated: Secondary | ICD-10-CM | POA: Insufficient documentation

## 2024-03-13 DIAGNOSIS — I472 Ventricular tachycardia, unspecified: Secondary | ICD-10-CM

## 2024-03-13 DIAGNOSIS — I428 Other cardiomyopathies: Secondary | ICD-10-CM | POA: Insufficient documentation

## 2024-03-13 DIAGNOSIS — I255 Ischemic cardiomyopathy: Secondary | ICD-10-CM

## 2024-03-13 DIAGNOSIS — Z955 Presence of coronary angioplasty implant and graft: Secondary | ICD-10-CM | POA: Insufficient documentation

## 2024-03-13 DIAGNOSIS — I480 Paroxysmal atrial fibrillation: Secondary | ICD-10-CM | POA: Insufficient documentation

## 2024-03-13 DIAGNOSIS — I5022 Chronic systolic (congestive) heart failure: Secondary | ICD-10-CM | POA: Insufficient documentation

## 2024-03-13 DIAGNOSIS — Z95810 Presence of automatic (implantable) cardiac defibrillator: Secondary | ICD-10-CM | POA: Diagnosis not present

## 2024-03-13 DIAGNOSIS — I252 Old myocardial infarction: Secondary | ICD-10-CM | POA: Insufficient documentation

## 2024-03-13 DIAGNOSIS — E119 Type 2 diabetes mellitus without complications: Secondary | ICD-10-CM | POA: Insufficient documentation

## 2024-03-13 DIAGNOSIS — Z79899 Other long term (current) drug therapy: Secondary | ICD-10-CM | POA: Diagnosis not present

## 2024-03-13 DIAGNOSIS — I2510 Atherosclerotic heart disease of native coronary artery without angina pectoris: Secondary | ICD-10-CM | POA: Diagnosis not present

## 2024-03-13 MED ORDER — AMIODARONE HCL 200 MG PO TABS: 100.0000 mg | ORAL_TABLET | Freq: Every day | ORAL | 3 refills | Status: AC

## 2024-03-13 MED ORDER — SPIRONOLACTONE 25 MG PO TABS: 25.0000 mg | ORAL_TABLET | Freq: Every day | ORAL | 3 refills | Status: AC

## 2024-03-13 MED ORDER — METOPROLOL SUCCINATE ER 100 MG PO TB24: 50.0000 mg | ORAL_TABLET | Freq: Every day | ORAL | 3 refills | Status: AC

## 2024-03-13 MED ORDER — FUROSEMIDE 40 MG PO TABS: 40.0000 mg | ORAL_TABLET | Freq: Every day | ORAL | 3 refills | Status: AC

## 2024-03-13 MED ORDER — RAMIPRIL 10 MG PO CAPS: 10.0000 mg | ORAL_CAPSULE | Freq: Every day | ORAL | 3 refills | Status: AC

## 2024-03-13 NOTE — Patient Instructions (Signed)
 There has been no changes to your medications.  Your physician recommends that you schedule a follow-up appointment in: 6 months ( March 2026) ** PLEASE CALL THE OFFICE IN JANUARY 2026 TO ARRANGE YOUR FOLLOW UP APPOINTMENT.**  If you have any questions or concerns before your next appointment please send us  a message through [facility] or call our office at [phone].    TO LEAVE A MESSAGE FOR THE NURSE SELECT OPTION 2, PLEASE LEAVE A MESSAGE INCLUDING: YOUR NAME DATE OF BIRTH CALL BACK NUMBER REASON FOR CALL**this is important as we prioritize the call backs  YOU WILL RECEIVE A CALL BACK THE SAME DAY AS LONG AS YOU CALL BEFORE 4:00 PM  At the [facility], you and your health needs are our priority. As part of our continuing mission to provide you with exceptional heart care, we have created designated Provider Care Teams. These Care Teams include your primary Cardiologist (physician) and Advanced Practice Providers (APPs- Physician Assistants and Nurse Practitioners) who all work together to provide you with the care you need, when you need it.   You may see any of the following providers on your designated Care Team at your next follow up: Dr Toribio Fuel Dr Ezra Shuck Dr. Ria Commander Dr. Morene Brownie Amy Lenetta, NP Caffie Shed, [place] [facility] [place], [place] Beckey Coe, NP [place] Lee, NP Ellouise Class, NP Tinnie Redman, PharmD Jaun Bash, PharmD   Please be sure to bring in all your medications bottles to every appointment.    Thank you for choosing [facility]

## 2024-03-21 ENCOUNTER — Encounter: Payer: Self-pay | Admitting: Adult Health

## 2024-03-25 NOTE — Progress Notes (Signed)
Remote ICD Transmission.

## 2024-06-03 ENCOUNTER — Ambulatory Visit: Payer: Medicare Other

## 2024-06-03 DIAGNOSIS — I472 Ventricular tachycardia, unspecified: Secondary | ICD-10-CM | POA: Diagnosis not present

## 2024-06-04 LAB — CUP PACEART REMOTE DEVICE CHECK
Battery Remaining Longevity: 114 mo
Battery Remaining Percentage: 100 %
Brady Statistic RA Percent Paced: 28 %
Brady Statistic RV Percent Paced: 99 %
Date Time Interrogation Session: 20251125032100
HighPow Impedance: 52 Ohm
Implantable Lead Connection Status: 753985
Implantable Lead Connection Status: 753985
Implantable Lead Connection Status: 753985
Implantable Lead Implant Date: 20051207
Implantable Lead Implant Date: 20051207
Implantable Lead Implant Date: 20110721
Implantable Lead Location: 753858
Implantable Lead Location: 753859
Implantable Lead Location: 753860
Implantable Lead Model: 158
Implantable Lead Model: 4543
Implantable Lead Model: 5076
Implantable Lead Serial Number: 148938
Implantable Lead Serial Number: 205859
Implantable Pulse Generator Implant Date: 20230227
Lead Channel Impedance Value: 1045 Ohm
Lead Channel Impedance Value: 434 Ohm
Lead Channel Impedance Value: 503 Ohm
Lead Channel Pacing Threshold Amplitude: 0.7 V
Lead Channel Pacing Threshold Amplitude: 1.1 V
Lead Channel Pacing Threshold Amplitude: 1.6 V
Lead Channel Pacing Threshold Pulse Width: 0.4 ms
Lead Channel Pacing Threshold Pulse Width: 0.4 ms
Lead Channel Pacing Threshold Pulse Width: 0.4 ms
Lead Channel Setting Pacing Amplitude: 2 V
Lead Channel Setting Pacing Amplitude: 2 V
Lead Channel Setting Pacing Amplitude: 2.4 V
Lead Channel Setting Pacing Pulse Width: 0.4 ms
Lead Channel Setting Pacing Pulse Width: 0.4 ms
Lead Channel Setting Sensing Sensitivity: 0.5 mV
Lead Channel Setting Sensing Sensitivity: 1 mV
Pulse Gen Serial Number: 154759

## 2024-06-09 ENCOUNTER — Ambulatory Visit: Payer: Self-pay | Admitting: Cardiology

## 2024-06-09 NOTE — Progress Notes (Signed)
 Remote ICD Transmission

## 2024-09-02 ENCOUNTER — Ambulatory Visit: Payer: Medicare Other

## 2024-12-02 ENCOUNTER — Ambulatory Visit: Payer: Medicare Other
